# Patient Record
Sex: Female | Born: 1985 | Race: White | Hispanic: No | Marital: Married | Smoking: Current every day smoker
Health system: Southern US, Community
[De-identification: ages and names within clinical notes are randomized; demographics above are authoritative.]

## PROBLEM LIST (undated history)

## (undated) DIAGNOSIS — F1010 Alcohol abuse, uncomplicated: Secondary | ICD-10-CM

## (undated) DIAGNOSIS — Z3490 Encounter for supervision of normal pregnancy, unspecified, unspecified trimester: Principal | ICD-10-CM

## (undated) DIAGNOSIS — N390 Urinary tract infection, site not specified: Secondary | ICD-10-CM

## (undated) DIAGNOSIS — F329 Major depressive disorder, single episode, unspecified: Secondary | ICD-10-CM

## (undated) DIAGNOSIS — F17200 Nicotine dependence, unspecified, uncomplicated: Secondary | ICD-10-CM

## (undated) DIAGNOSIS — F32A Depression, unspecified: Secondary | ICD-10-CM

## (undated) DIAGNOSIS — F1110 Opioid abuse, uncomplicated: Secondary | ICD-10-CM

## (undated) DIAGNOSIS — R51 Headache: Secondary | ICD-10-CM

## (undated) DIAGNOSIS — F419 Anxiety disorder, unspecified: Secondary | ICD-10-CM

## (undated) DIAGNOSIS — F909 Attention-deficit hyperactivity disorder, unspecified type: Secondary | ICD-10-CM

## (undated) HISTORY — DX: Attention-deficit hyperactivity disorder, unspecified type: F90.9

## (undated) HISTORY — DX: Urinary tract infection, site not specified: N39.0

## (undated) HISTORY — DX: Nicotine dependence, unspecified, uncomplicated: F17.200

## (undated) HISTORY — PX: NO PAST SURGERIES: SHX2092

## (undated) HISTORY — DX: Opioid abuse, uncomplicated: F11.10

## (undated) HISTORY — DX: Encounter for supervision of normal pregnancy, unspecified, unspecified trimester: Z34.90

---
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
## 2007-12-23 ENCOUNTER — Emergency Department (HOSPITAL_COMMUNITY): Admission: EM | Admit: 2007-12-23 | Discharge: 2007-12-23 | Payer: Self-pay | Admitting: Emergency Medicine

## 2008-06-24 ENCOUNTER — Emergency Department (HOSPITAL_COMMUNITY): Admission: EM | Admit: 2008-06-24 | Discharge: 2008-06-24 | Payer: Self-pay | Admitting: Emergency Medicine

## 2008-08-27 ENCOUNTER — Emergency Department (HOSPITAL_COMMUNITY): Admission: EM | Admit: 2008-08-27 | Discharge: 2008-08-27 | Payer: Self-pay | Admitting: Emergency Medicine

## 2008-09-23 ENCOUNTER — Emergency Department (HOSPITAL_COMMUNITY): Admission: EM | Admit: 2008-09-23 | Discharge: 2008-09-23 | Payer: Self-pay | Admitting: Family Medicine

## 2008-12-04 ENCOUNTER — Emergency Department (HOSPITAL_COMMUNITY): Admission: EM | Admit: 2008-12-04 | Discharge: 2008-12-04 | Payer: Self-pay | Admitting: Emergency Medicine

## 2009-05-30 ENCOUNTER — Ambulatory Visit (HOSPITAL_COMMUNITY): Admission: RE | Admit: 2009-05-30 | Discharge: 2009-05-30 | Payer: Self-pay | Admitting: Family Medicine

## 2010-04-16 ENCOUNTER — Ambulatory Visit (HOSPITAL_COMMUNITY): Admission: RE | Admit: 2010-04-16 | Discharge: 2010-04-16 | Payer: Self-pay | Admitting: Internal Medicine

## 2011-06-10 ENCOUNTER — Other Ambulatory Visit (HOSPITAL_COMMUNITY)
Admission: RE | Admit: 2011-06-10 | Discharge: 2011-06-10 | Disposition: A | Discharge status: Home / Self Care | Payer: Medicaid Other | Source: Ambulatory Visit | Attending: Obstetrics and Gynecology | Admitting: Obstetrics and Gynecology

## 2011-06-10 DIAGNOSIS — Z01419 Encounter for gynecological examination (general) (routine) without abnormal findings: Secondary | ICD-10-CM | POA: Insufficient documentation

## 2011-06-10 DIAGNOSIS — Z113 Encounter for screening for infections with a predominantly sexual mode of transmission: Secondary | ICD-10-CM | POA: Insufficient documentation

## 2011-06-15 LAB — HIV ANTIBODY (ROUTINE TESTING W REFLEX): HIV: NONREACTIVE

## 2011-06-15 LAB — ABO/RH: RH Type: NEGATIVE

## 2011-06-15 LAB — HEPATITIS B SURFACE ANTIGEN: Hepatitis B Surface Ag: NEGATIVE

## 2011-07-06 LAB — DIFFERENTIAL
Basophils Absolute: 0
Eosinophils Relative: 2
Lymphs Abs: 2.4
Monocytes Absolute: 0.7
Neutrophils Relative %: 74

## 2011-07-06 LAB — COMPREHENSIVE METABOLIC PANEL
AST: 22
Albumin: 3.7
Alkaline Phosphatase: 70
Chloride: 108
GFR calc Af Amer: >60
Glucose, Bld: 101 — ABNORMAL HIGH
Potassium: 4.2
Sodium: 139

## 2011-07-06 LAB — CBC
MCHC: 34
RBC: 4.34
RDW: 12.8

## 2011-07-06 LAB — PREGNANCY, URINE: Preg Test, Ur: NEGATIVE

## 2011-10-06 HISTORY — PX: OVARIAN CYST DRAINAGE: SHX325

## 2011-10-10 ENCOUNTER — Emergency Department (HOSPITAL_COMMUNITY)
Admission: EM | Admit: 2011-10-10 | Discharge: 2011-10-10 | Disposition: A | Discharge status: Home / Self Care | Payer: Medicaid Other | Attending: Emergency Medicine | Admitting: Emergency Medicine

## 2011-10-10 DIAGNOSIS — J45909 Unspecified asthma, uncomplicated: Secondary | ICD-10-CM | POA: Insufficient documentation

## 2011-10-10 DIAGNOSIS — N390 Urinary tract infection, site not specified: Secondary | ICD-10-CM | POA: Insufficient documentation

## 2011-10-10 DIAGNOSIS — R109 Unspecified abdominal pain: Secondary | ICD-10-CM | POA: Insufficient documentation

## 2011-10-10 DIAGNOSIS — N23 Unspecified renal colic: Secondary | ICD-10-CM | POA: Insufficient documentation

## 2011-10-10 DIAGNOSIS — M549 Dorsalgia, unspecified: Secondary | ICD-10-CM | POA: Insufficient documentation

## 2011-10-10 DIAGNOSIS — Z331 Pregnant state, incidental: Secondary | ICD-10-CM | POA: Insufficient documentation

## 2011-10-10 DIAGNOSIS — F17200 Nicotine dependence, unspecified, uncomplicated: Secondary | ICD-10-CM | POA: Insufficient documentation

## 2011-10-10 LAB — BASIC METABOLIC PANEL
BUN: 16 mg/dL (ref 6–23)
CO2: 27 mEq/L (ref 19–32)
Creatinine, Ser: 0.69 mg/dL (ref 0.50–1.10)
GFR calc non Af Amer: >90 mL/min (ref >90)
Glucose, Bld: 87 mg/dL (ref 70–99)
Sodium: 137 mEq/L (ref 135–145)

## 2011-10-10 LAB — URINALYSIS, ROUTINE W REFLEX MICROSCOPIC
Bilirubin Urine: NEGATIVE
Glucose, UA: NEGATIVE mg/dL
Leukocytes, UA: NEGATIVE
Nitrite: NEGATIVE
pH: 6 (ref 5.0–8.0)

## 2011-10-10 LAB — CBC
Hemoglobin: 11.2 g/dL — ABNORMAL LOW (ref 12.0–15.0)
MCH: 30.9 pg (ref 26.0–34.0)
MCV: 90.1 fL (ref 78.0–100.0)
Platelets: 302 10*3/uL (ref 150–400)
RBC: 3.63 MIL/uL — ABNORMAL LOW (ref 3.87–5.11)
RDW: 12.4 % (ref 11.5–15.5)

## 2011-10-10 MED ORDER — CEFUROXIME AXETIL 250 MG PO TABS: 500.0000 mg | ORAL_TABLET | Freq: Two times a day (BID) | ORAL | Status: AC

## 2011-10-10 MED ORDER — MORPHINE SULFATE 4 MG/ML IJ SOLN
4.0000 mg | Freq: Once | INTRAMUSCULAR | Status: AC
  Administered 2011-10-10: 4 mg via INTRAVENOUS
  Filled 2011-10-10: qty 1

## 2011-10-10 MED ORDER — DEXTROSE 5 % IV SOLN
1.0000 g | INTRAVENOUS | Status: DC
  Administered 2011-10-10: 1 g via INTRAVENOUS
  Filled 2011-10-10: qty 10

## 2011-10-10 MED ORDER — HYDROCODONE-ACETAMINOPHEN 5-325 MG PO TABS: 1.0000 | ORAL_TABLET | Freq: Four times a day (QID) | ORAL | Status: AC | PRN

## 2011-10-10 MED ORDER — ACETAMINOPHEN 325 MG PO TABS
650.0000 mg | ORAL_TABLET | Freq: Once | ORAL | Status: DC
  Filled 2011-10-10: qty 2

## 2011-10-10 MED ORDER — SODIUM CHLORIDE 0.9 % IV SOLN
Freq: Once | INTRAVENOUS | Status: AC
  Administered 2011-10-10: 21:00:00 via INTRAVENOUS

## 2011-10-10 NOTE — Progress Notes (Signed)
Pt in to [facility] c/o low back pain.  Pt reports no leaking of fluid or bleeding.  Positive fetal movement.  Pt receives care at [facility]

## 2011-10-10 NOTE — ED Notes (Signed)
Spoke with Morrie Sheldon, RN at [facility], she reports the baby looks fine and advises pt can come off monitor if ok with EDP

## 2011-10-10 NOTE — ED Notes (Signed)
Pt reports that she just took 2 extra strength Tylenol prior to arrival.  Dr Lynelle Doctor notified.  Medication held.

## 2011-10-10 NOTE — ED Provider Notes (Signed)
History     CSN: [id number]  Arrival date & time 10/10/11  1912   First MD Initiated Contact with Patient 10/10/11 1935      Chief Complaint  Patient presents with  . Back Pain    (Consider location/radiation/quality/duration/timing/severity/associated sxs/prior treatment) HPI Patient presents emergency room with complaints of left-sided back pain. Patient is currently [redacted] weeks pregnant. She has not had any complications with the pregnancy. She did have an episode of flank pain a few weeks ago had a urinalysis that was normal the symptoms subsequently resolved. Patient does not recall any recent injuries. She has not had any abdominal pain or noticed any contractions. The pain in her back does seem to come and go. She cannot recall anything specifically makes it worse or better. She has not noticed any vaginal bleeding or any leakage of fluid. Past Medical History  Diagnosis Date  . Asthma     History reviewed. No pertinent past surgical history.  No family history on file.  History  Substance Use Topics  . Smoking status: Current Everyday Smoker -- 0.5 packs/day  . Smokeless tobacco: Not on file  . Alcohol Use: No    OB History    Grav Para Term Preterm Abortions TAB SAB Ect Mult Living   1               Review of Systems  Constitutional: Negative for fever.  Respiratory: Negative for cough.   All other systems reviewed and are negative.    Allergies  Darvocet  Home Medications  No current outpatient prescriptions on file.  BP 115/71  Pulse 90  Temp(Src) 98.2 F (36.8 C) (Oral)  Resp 20  Ht 5\' 6"  (1.676 m)  Wt 170 lb (77.111 kg)  BMI 27.44 kg/m2  SpO2 100%  Physical Exam  Nursing note and vitals reviewed. Constitutional: She appears well-developed and well-nourished. No distress.  HENT:  Head: Normocephalic and atraumatic.  Right Ear: External ear normal.  Left Ear: External ear normal.  Eyes: Conjunctivae are normal. Right eye exhibits no  discharge. Left eye exhibits no discharge. No scleral icterus.  Neck: Neck supple. No tracheal deviation present.  Cardiovascular: Normal rate, regular rhythm and intact distal pulses.   Pulmonary/Chest: Effort normal and breath sounds normal. No stridor. No respiratory distress. She has no wheezes. She has no rales.  Abdominal: Soft. Bowel sounds are normal. She exhibits no distension. Mass: gravid abdomen. There is no tenderness. There is no rebound and no guarding.  Musculoskeletal: She exhibits no edema and no tenderness.       Mild left CVA tenderness  Neurological: She is alert. She has normal strength. No sensory deficit. Cranial nerve deficit:  no gross defecits noted. She exhibits normal muscle tone. She displays no seizure activity. Coordination normal.  Skin: Skin is warm and dry. No rash noted.  Psychiatric: She has a normal mood and affect.    ED Course  Procedures (including critical care time)  Medications  acetaminophen (TYLENOL) tablet 650 mg (650 mg Oral Not Given 10/10/11 2021)  cefTRIAXone (ROCEPHIN) 1 g in dextrose 5 % 50 mL IVPB (1 g Intravenous Given 10/10/11 2057)  Fluticasone-Salmeterol (ADVAIR) 100-50 MCG/DOSE AEPB (not administered)  prenatal vitamin w/FE, FA (PRENATAL 1 + 1) 27-1 MG TABS (not administered)  morphine 4 MG/ML injection 4 mg (4 mg Intravenous Given 10/10/11 2042)  0.9 %  sodium chloride infusion (  Intravenous New Bag/Given 10/10/11 2057)    Labs Reviewed  URINALYSIS, ROUTINE W  REFLEX MICROSCOPIC - Abnormal; Notable for the following:    APPearance CLOUDY (*)    Specific Gravity, Urine >1.030 (*)    Hgb urine dipstick LARGE (*)    Ketones, ur TRACE (*)    All other components within normal limits  URINE MICROSCOPIC-ADD ON - Abnormal; Notable for the following:    Squamous Epithelial / LPF MANY (*)    Bacteria, UA MANY (*)    All other components within normal limits  CBC - Abnormal; Notable for the following:    WBC 12.7 (*)    RBC 3.63 (*)     Hemoglobin 11.2 (*)    HCT 32.7 (*)    All other components within normal limits  BASIC METABOLIC PANEL   No results found.   1. Renal colic   2. Pregnancy as incidental finding       MDM  The patient was placed on fetal monitoring. She does not have any evidence of contractions and there is was a reassuring strip according to the nurse at [facility] hospital..  patient does have flank pain in his to numerous to count red blood cells in her urine. She does have bacteria and 11-20 white blood cells but her symptoms are more suspicious for possible renal colic. Patient has never had kidney stones before but her sister has had them and had a similar presentation. With her pregnancy, a CT scan for confirmation is not indicated. Renal ultrasound would be another option to check for hydronephrosis her vertigo is not available this evening. Patient does not appear to be in any distress. Do feel it is reasonable to treat her with oral pain medications I will empirically start her on antibiotics to cover for urinary tract infection. Patient should followup with her OB doctor on Monday to be rechecked. If She is having worsening symptoms she is to return to the emergent or go to [facility] hospital for further evaluation        Celene Kras, MD 10/10/11 2147

## 2011-10-10 NOTE — ED Notes (Signed)
Pt presents with left sided back pain along border of ribs. Pt denies urinary symptoms but states she has had UTI while pregnant. Pt is 29.[redacted] weeks pregnant. OBGYN Dr Emelda Fear.

## 2011-10-10 NOTE — Progress Notes (Signed)
APRN states that pt to be d/c home to follow up w/ Dr. Emelda Fear.  Pt given abx for UTI and possible kidney stone

## 2011-12-08 ENCOUNTER — Other Ambulatory Visit: Payer: Self-pay | Admitting: Obstetrics & Gynecology

## 2011-12-09 ENCOUNTER — Encounter (HOSPITAL_COMMUNITY): Payer: Self-pay | Admitting: Pharmacist

## 2011-12-09 ENCOUNTER — Encounter (HOSPITAL_COMMUNITY): Payer: Self-pay

## 2011-12-10 ENCOUNTER — Other Ambulatory Visit (HOSPITAL_COMMUNITY): Payer: Medicaid Other

## 2011-12-10 ENCOUNTER — Encounter (HOSPITAL_COMMUNITY)
Admission: RE | Admit: 2011-12-10 | Discharge: 2011-12-10 | Disposition: A | Discharge status: Home / Self Care | Payer: Medicaid Other | Source: Ambulatory Visit | Attending: Obstetrics & Gynecology | Admitting: Obstetrics & Gynecology

## 2011-12-10 ENCOUNTER — Encounter (HOSPITAL_COMMUNITY): Payer: Self-pay

## 2011-12-10 HISTORY — DX: Major depressive disorder, single episode, unspecified: F32.9

## 2011-12-10 HISTORY — DX: Headache: R51

## 2011-12-10 HISTORY — DX: Anxiety disorder, unspecified: F41.9

## 2011-12-10 HISTORY — DX: Depression, unspecified: F32.A

## 2011-12-10 LAB — CBC
HCT: 37 % (ref 36.0–46.0)
Hemoglobin: 12.1 g/dL (ref 12.0–15.0)
MCH: 29.6 pg (ref 26.0–34.0)
MCHC: 32.7 g/dL (ref 30.0–36.0)
MCV: 90.5 fL (ref 78.0–100.0)
Platelets: 315 10*3/uL (ref 150–400)
RBC: 4.09 MIL/uL (ref 3.87–5.11)
RDW: 13.2 % (ref 11.5–15.5)
WBC: 10.9 10*3/uL — ABNORMAL HIGH (ref 4.0–10.5)

## 2011-12-10 LAB — URINALYSIS, ROUTINE W REFLEX MICROSCOPIC
Bilirubin Urine: NEGATIVE
Glucose, UA: NEGATIVE mg/dL
Ketones, ur: NEGATIVE mg/dL
Nitrite: NEGATIVE
Protein, ur: NEGATIVE mg/dL
Specific Gravity, Urine: 1.025 (ref 1.005–1.030)
Urobilinogen, UA: 0.2 mg/dL (ref 0.0–1.0)
pH: 6 (ref 5.0–8.0)

## 2011-12-10 LAB — COMPREHENSIVE METABOLIC PANEL
ALT: 11 U/L (ref 0–35)
AST: 12 U/L (ref 0–37)
Albumin: 2.4 g/dL — ABNORMAL LOW (ref 3.5–5.2)
Alkaline Phosphatase: 140 U/L — ABNORMAL HIGH (ref 39–117)
BUN: 12 mg/dL (ref 6–23)
CO2: 25 mEq/L (ref 19–32)
Calcium: 9 mg/dL (ref 8.4–10.5)
Chloride: 103 mEq/L (ref 96–112)
Creatinine, Ser: 0.66 mg/dL (ref 0.50–1.10)
GFR calc Af Amer: >90 mL/min (ref >90)
GFR calc non Af Amer: >90 mL/min (ref >90)
Glucose, Bld: 83 mg/dL (ref 70–99)
Potassium: 3.8 mEq/L (ref 3.5–5.1)
Sodium: 135 mEq/L (ref 135–145)
Total Bilirubin: 0.1 mg/dL — ABNORMAL LOW (ref 0.3–1.2)
Total Protein: 5.9 g/dL — ABNORMAL LOW (ref 6.0–8.3)

## 2011-12-10 LAB — URINE MICROSCOPIC-ADD ON

## 2011-12-10 NOTE — Patient Instructions (Addendum)
20 Brittany Cortez  12/10/2011   Your procedure is scheduled on:  12/16/11  Enter through the Main Entrance of [facility] at 3PM   Pick up the phone at the desk and dial [phone].   Call this number if you have problems the morning of surgery: [phone]   Remember:   Do not eat food: after 10AM per Dr. Rodman Pickle, may have cereal, toast, no fat, no protein the day of surgery.  Do not drink clear liquids: after 2PM.  Take these medicines the morning of surgery with A SIP OF WATER: Pain medication if needed   Do not wear jewelry, make-up or nail polish.  Do not wear lotions, powders, or perfumes. You may wear deodorant.  Do not shave 48 hours prior to surgery.  Do not bring valuables to the hospital.  Contacts, dentures or bridgework may not be worn into surgery.  Leave suitcase in the car. After surgery it may be brought to your room.  For patients admitted to the hospital, checkout time is 11:00 AM the day of discharge.   Patients discharged the day of surgery will not be allowed to drive home.  Name and phone number of your driver: NA  Special Instructions: CHG Shower Use Special Wash: 1/2 bottle night before surgery and 1/2 bottle morning of surgery.   Please read over the following fact sheets that you were given: MRSA Information

## 2011-12-15 MED ORDER — CEFAZOLIN SODIUM-DEXTROSE 2-3 GM-% IV SOLR
2.0000 g | INTRAVENOUS | Status: AC
  Administered 2011-12-16: 2 g via INTRAVENOUS
  Filled 2011-12-15: qty 50

## 2011-12-16 ENCOUNTER — Encounter (HOSPITAL_COMMUNITY): Payer: Self-pay | Admitting: *Deleted

## 2011-12-16 ENCOUNTER — Inpatient Hospital Stay (HOSPITAL_COMMUNITY): Payer: Medicaid Other

## 2011-12-16 ENCOUNTER — Inpatient Hospital Stay (HOSPITAL_COMMUNITY)
Admission: RE | Admit: 2011-12-16 | Discharge: 2011-12-18 | DRG: 766 | Disposition: A | Discharge status: Home / Self Care | Payer: Medicaid Other | Source: Ambulatory Visit | Attending: Obstetrics & Gynecology | Admitting: Obstetrics & Gynecology

## 2011-12-16 ENCOUNTER — Encounter (HOSPITAL_COMMUNITY)
Admission: RE | Disposition: A | Discharge status: Home / Self Care | Payer: Self-pay | Source: Ambulatory Visit | Attending: Obstetrics & Gynecology

## 2011-12-16 ENCOUNTER — Encounter (HOSPITAL_COMMUNITY): Payer: Self-pay

## 2011-12-16 ENCOUNTER — Encounter (HOSPITAL_COMMUNITY): Payer: Self-pay | Admitting: Anesthesiology

## 2011-12-16 DIAGNOSIS — O321XX0 Maternal care for breech presentation, not applicable or unspecified: Principal | ICD-10-CM | POA: Diagnosis present

## 2011-12-16 LAB — SURGICAL PCR SCREEN
MRSA, PCR: NEGATIVE
Staphylococcus aureus: NEGATIVE

## 2011-12-16 SURGERY — Surgical Case
Anesthesia: Spinal

## 2011-12-16 MED ORDER — EPHEDRINE 5 MG/ML INJ
INTRAVENOUS | Status: AC
  Filled 2011-12-16: qty 10

## 2011-12-16 MED ORDER — OXYTOCIN 10 UNIT/ML IJ SOLN
INTRAMUSCULAR | Status: AC
  Filled 2011-12-16: qty 4

## 2011-12-16 MED ORDER — ONDANSETRON HCL 4 MG/2ML IJ SOLN: 4.0000 mg | Freq: Three times a day (TID) | INTRAMUSCULAR | Status: DC | PRN

## 2011-12-16 MED ORDER — DIPHENHYDRAMINE HCL 50 MG/ML IJ SOLN: 12.5000 mg | Freq: Four times a day (QID) | INTRAMUSCULAR | Status: DC | PRN

## 2011-12-16 MED ORDER — NALOXONE HCL 0.4 MG/ML IJ SOLN: 0.4000 mg | INTRAMUSCULAR | Status: DC | PRN

## 2011-12-16 MED ORDER — MEPERIDINE HCL 25 MG/ML IJ SOLN: 6.2500 mg | INTRAMUSCULAR | Status: DC | PRN

## 2011-12-16 MED ORDER — OXYTOCIN 20 UNITS IN LACTATED RINGERS INFUSION - SIMPLE
INTRAVENOUS | Status: DC | PRN
  Administered 2011-12-16: 20 [IU] via INTRAVENOUS

## 2011-12-16 MED ORDER — ACETAMINOPHEN 10 MG/ML IV SOLN: 1000.0000 mg | Freq: Four times a day (QID) | INTRAVENOUS | Status: AC | PRN

## 2011-12-16 MED ORDER — LANOLIN HYDROUS EX OINT: 1.0000 "application " | TOPICAL_OINTMENT | CUTANEOUS | Status: DC | PRN

## 2011-12-16 MED ORDER — NICOTINE 7 MG/24HR TD PT24
7.0000 mg | MEDICATED_PATCH | Freq: Every day | TRANSDERMAL | Status: DC
  Administered 2011-12-17 (×2): 7 mg via TRANSDERMAL
  Filled 2011-12-16 (×5): qty 1

## 2011-12-16 MED ORDER — FENTANYL CITRATE 0.05 MG/ML IJ SOLN
INTRAMUSCULAR | Status: AC
  Filled 2011-12-16: qty 2

## 2011-12-16 MED ORDER — LACTATED RINGERS IV SOLN
INTRAVENOUS | Status: DC
  Administered 2011-12-16: 16:00:00 via INTRAVENOUS

## 2011-12-16 MED ORDER — OXYTOCIN 20 UNITS IN LACTATED RINGERS INFUSION - SIMPLE
125.0000 mL/h | INTRAVENOUS | Status: AC
  Administered 2011-12-16: 125 mL/h via INTRAVENOUS

## 2011-12-16 MED ORDER — OXYCODONE-ACETAMINOPHEN 5-325 MG PO TABS
1.0000 | ORAL_TABLET | ORAL | Status: DC | PRN
  Administered 2011-12-17 – 2011-12-18 (×5): 2 via ORAL
  Filled 2011-12-16 (×5): qty 2

## 2011-12-16 MED ORDER — MUPIROCIN 2 % EX OINT
TOPICAL_OINTMENT | CUTANEOUS | Status: AC
  Filled 2011-12-16: qty 22

## 2011-12-16 MED ORDER — OXYTOCIN 20 UNITS IN LACTATED RINGERS INFUSION - SIMPLE
INTRAVENOUS | Status: AC
  Filled 2011-12-16: qty 1000

## 2011-12-16 MED ORDER — ONDANSETRON HCL 4 MG/2ML IJ SOLN
INTRAMUSCULAR | Status: DC | PRN
  Administered 2011-12-16: 4 mg via INTRAVENOUS

## 2011-12-16 MED ORDER — METOCLOPRAMIDE HCL 5 MG/ML IJ SOLN: 10.0000 mg | Freq: Three times a day (TID) | INTRAMUSCULAR | Status: DC | PRN

## 2011-12-16 MED ORDER — SODIUM CHLORIDE 0.9 % IJ SOLN: 9.0000 mL | INTRAMUSCULAR | Status: DC | PRN

## 2011-12-16 MED ORDER — KETOROLAC TROMETHAMINE 30 MG/ML IJ SOLN
30.0000 mg | Freq: Four times a day (QID) | INTRAMUSCULAR | Status: AC | PRN
  Administered 2011-12-16 (×2): 30 mg via INTRAVENOUS
  Filled 2011-12-16: qty 1

## 2011-12-16 MED ORDER — EPHEDRINE SULFATE 50 MG/ML IJ SOLN
INTRAMUSCULAR | Status: DC | PRN
  Administered 2011-12-16: 10 mg via INTRAVENOUS
  Administered 2011-12-16: 15 mg via INTRAVENOUS
  Administered 2011-12-16: 10 mg via INTRAVENOUS
  Administered 2011-12-16: 5 mg via INTRAVENOUS

## 2011-12-16 MED ORDER — DIPHENHYDRAMINE HCL 50 MG/ML IJ SOLN: 25.0000 mg | INTRAMUSCULAR | Status: DC | PRN

## 2011-12-16 MED ORDER — SCOPOLAMINE 1 MG/3DAYS TD PT72: 1.0000 | MEDICATED_PATCH | Freq: Once | TRANSDERMAL | Status: DC

## 2011-12-16 MED ORDER — FLUTICASONE-SALMETEROL 100-50 MCG/DOSE IN AEPB
1.0000 | INHALATION_SPRAY | Freq: Two times a day (BID) | RESPIRATORY_TRACT | Status: DC
  Administered 2011-12-17 – 2011-12-18 (×2): 1 via RESPIRATORY_TRACT
  Filled 2011-12-16: qty 14

## 2011-12-16 MED ORDER — DIPHENHYDRAMINE HCL 25 MG PO CAPS: 25.0000 mg | ORAL_CAPSULE | ORAL | Status: DC | PRN

## 2011-12-16 MED ORDER — IBUPROFEN 600 MG PO TABS
600.0000 mg | ORAL_TABLET | Freq: Four times a day (QID) | ORAL | Status: DC
  Administered 2011-12-17 – 2011-12-18 (×6): 600 mg via ORAL
  Filled 2011-12-16 (×6): qty 1

## 2011-12-16 MED ORDER — PRENATAL MULTIVITAMIN CH
1.0000 | ORAL_TABLET | Freq: Every day | ORAL | Status: DC
  Administered 2011-12-17 – 2011-12-18 (×2): 1 via ORAL
  Filled 2011-12-16 (×2): qty 1

## 2011-12-16 MED ORDER — MORPHINE SULFATE (PF) 0.5 MG/ML IJ SOLN
INTRAMUSCULAR | Status: DC | PRN
  Administered 2011-12-16: .1 mg via INTRATHECAL

## 2011-12-16 MED ORDER — DIBUCAINE 1 % RE OINT: 1.0000 "application " | TOPICAL_OINTMENT | RECTAL | Status: DC | PRN

## 2011-12-16 MED ORDER — PHENYLEPHRINE 40 MCG/ML (10ML) SYRINGE FOR IV PUSH (FOR BLOOD PRESSURE SUPPORT)
PREFILLED_SYRINGE | INTRAVENOUS | Status: AC
  Filled 2011-12-16: qty 5

## 2011-12-16 MED ORDER — NALBUPHINE HCL 10 MG/ML IJ SOLN
5.0000 mg | INTRAMUSCULAR | Status: DC | PRN
  Filled 2011-12-16: qty 1

## 2011-12-16 MED ORDER — SIMETHICONE 80 MG PO CHEW
80.0000 mg | CHEWABLE_TABLET | Freq: Three times a day (TID) | ORAL | Status: DC
  Administered 2011-12-17 – 2011-12-18 (×5): 80 mg via ORAL

## 2011-12-16 MED ORDER — MIDAZOLAM HCL 2 MG/2ML IJ SOLN: 0.5000 mg | Freq: Once | INTRAMUSCULAR | Status: DC | PRN

## 2011-12-16 MED ORDER — ONDANSETRON HCL 4 MG/2ML IJ SOLN: 4.0000 mg | INTRAMUSCULAR | Status: DC | PRN

## 2011-12-16 MED ORDER — SCOPOLAMINE 1 MG/3DAYS TD PT72
MEDICATED_PATCH | TRANSDERMAL | Status: AC
  Administered 2011-12-16: 1.5 mg via TRANSDERMAL
  Filled 2011-12-16: qty 1

## 2011-12-16 MED ORDER — SCOPOLAMINE 1 MG/3DAYS TD PT72
1.0000 | MEDICATED_PATCH | Freq: Once | TRANSDERMAL | Status: DC
  Administered 2011-12-16: 1.5 mg via TRANSDERMAL

## 2011-12-16 MED ORDER — ONDANSETRON HCL 4 MG/2ML IJ SOLN: 4.0000 mg | Freq: Four times a day (QID) | INTRAMUSCULAR | Status: DC | PRN

## 2011-12-16 MED ORDER — SCOPOLAMINE 1 MG/3DAYS TD PT72
MEDICATED_PATCH | TRANSDERMAL | Status: AC
  Filled 2011-12-16: qty 1

## 2011-12-16 MED ORDER — DIPHENHYDRAMINE HCL 25 MG PO CAPS: 25.0000 mg | ORAL_CAPSULE | Freq: Four times a day (QID) | ORAL | Status: DC | PRN

## 2011-12-16 MED ORDER — ACETAMINOPHEN 325 MG PO TABS: 325.0000 mg | ORAL_TABLET | ORAL | Status: DC | PRN

## 2011-12-16 MED ORDER — TETANUS-DIPHTH-ACELL PERTUSSIS 5-2.5-18.5 LF-MCG/0.5 IM SUSP: 0.5000 mL | Freq: Once | INTRAMUSCULAR | Status: DC

## 2011-12-16 MED ORDER — MENTHOL 3 MG MT LOZG: 1.0000 | LOZENGE | OROMUCOSAL | Status: DC | PRN

## 2011-12-16 MED ORDER — SENNOSIDES-DOCUSATE SODIUM 8.6-50 MG PO TABS
2.0000 | ORAL_TABLET | Freq: Every day | ORAL | Status: DC
  Administered 2011-12-17: 2 via ORAL

## 2011-12-16 MED ORDER — ONDANSETRON HCL 4 MG/2ML IJ SOLN
INTRAMUSCULAR | Status: AC
  Filled 2011-12-16: qty 2

## 2011-12-16 MED ORDER — SODIUM CHLORIDE 0.9 % IJ SOLN: 3.0000 mL | INTRAMUSCULAR | Status: DC | PRN

## 2011-12-16 MED ORDER — ESCITALOPRAM OXALATE 10 MG PO TABS
10.0000 mg | ORAL_TABLET | Freq: Every day | ORAL | Status: DC
  Administered 2011-12-17 – 2011-12-18 (×2): 10 mg via ORAL
  Filled 2011-12-16 (×3): qty 1

## 2011-12-16 MED ORDER — LACTATED RINGERS IV SOLN
INTRAVENOUS | Status: DC
  Administered 2011-12-17: 03:00:00 via INTRAVENOUS

## 2011-12-16 MED ORDER — DIPHENHYDRAMINE HCL 12.5 MG/5ML PO ELIX
12.5000 mg | ORAL_SOLUTION | Freq: Four times a day (QID) | ORAL | Status: DC | PRN
  Filled 2011-12-16: qty 5

## 2011-12-16 MED ORDER — IBUPROFEN 600 MG PO TABS: 600.0000 mg | ORAL_TABLET | Freq: Four times a day (QID) | ORAL | Status: DC | PRN

## 2011-12-16 MED ORDER — SODIUM CHLORIDE 0.9 % IV SOLN: 1.0000 ug/kg/h | INTRAVENOUS | Status: DC | PRN

## 2011-12-16 MED ORDER — KETOROLAC TROMETHAMINE 30 MG/ML IJ SOLN: 30.0000 mg | Freq: Four times a day (QID) | INTRAMUSCULAR | Status: AC | PRN

## 2011-12-16 MED ORDER — BUPIVACAINE IN DEXTROSE 0.75-8.25 % IT SOLN
INTRATHECAL | Status: DC | PRN
  Administered 2011-12-16: 12 mg via INTRATHECAL

## 2011-12-16 MED ORDER — ZOLPIDEM TARTRATE 5 MG PO TABS
5.0000 mg | ORAL_TABLET | Freq: Every evening | ORAL | Status: DC | PRN
Start: 2011-12-16 — End: 2011-12-18

## 2011-12-16 MED ORDER — SIMETHICONE 80 MG PO CHEW: 80.0000 mg | CHEWABLE_TABLET | ORAL | Status: DC | PRN

## 2011-12-16 MED ORDER — WITCH HAZEL-GLYCERIN EX PADS: 1.0000 "application " | MEDICATED_PAD | CUTANEOUS | Status: DC | PRN

## 2011-12-16 MED ORDER — ONDANSETRON HCL 4 MG PO TABS
4.0000 mg | ORAL_TABLET | ORAL | Status: DC | PRN
Start: 2011-12-16 — End: 2011-12-18

## 2011-12-16 MED ORDER — LACTATED RINGERS IV SOLN
INTRAVENOUS | Status: DC | PRN
  Administered 2011-12-16 (×4): via INTRAVENOUS

## 2011-12-16 MED ORDER — FENTANYL CITRATE 0.05 MG/ML IJ SOLN
25.0000 ug | INTRAMUSCULAR | Status: DC | PRN
  Administered 2011-12-16 (×4): 50 ug via INTRAVENOUS

## 2011-12-16 MED ORDER — CYCLOBENZAPRINE HCL 10 MG PO TABS
10.0000 mg | ORAL_TABLET | Freq: Two times a day (BID) | ORAL | Status: DC | PRN
  Filled 2011-12-16: qty 1

## 2011-12-16 MED ORDER — PROMETHAZINE HCL 25 MG/ML IJ SOLN: 6.2500 mg | INTRAMUSCULAR | Status: DC | PRN

## 2011-12-16 MED ORDER — HYDROMORPHONE 0.3 MG/ML IV SOLN
INTRAVENOUS | Status: DC
  Administered 2011-12-17: 0.999 mg via INTRAVENOUS
  Administered 2011-12-17: 2.79 mg via INTRAVENOUS
  Administered 2011-12-17: 5.33 mL via INTRAVENOUS
  Administered 2011-12-17 (×2): via INTRAVENOUS
  Administered 2011-12-17: 2.7 mg via INTRAVENOUS

## 2011-12-16 MED ORDER — FENTANYL CITRATE 0.05 MG/ML IJ SOLN
INTRAMUSCULAR | Status: DC | PRN
  Administered 2011-12-16: 25 ug via INTRATHECAL

## 2011-12-16 MED ORDER — MORPHINE SULFATE 0.5 MG/ML IJ SOLN
INTRAMUSCULAR | Status: AC
  Filled 2011-12-16: qty 10

## 2011-12-16 MED ORDER — DIPHENHYDRAMINE HCL 50 MG/ML IJ SOLN: 12.5000 mg | INTRAMUSCULAR | Status: DC | PRN

## 2011-12-16 MED ORDER — KETOROLAC TROMETHAMINE 30 MG/ML IJ SOLN
INTRAMUSCULAR | Status: AC
  Administered 2011-12-16: 30 mg via INTRAVENOUS
  Filled 2011-12-16: qty 1

## 2011-12-16 SURGICAL SUPPLY — 29 items
CLOTH BEACON ORANGE TIMEOUT ST (SAFETY) ×2 IMPLANT
DERMABOND ADVANCED (GAUZE/BANDAGES/DRESSINGS) ×2
DERMABOND ADVANCED .7 DNX12 (GAUZE/BANDAGES/DRESSINGS) ×2 IMPLANT
DURAPREP 26ML APPLICATOR (WOUND CARE) ×4 IMPLANT
ELECT REM PT RETURN 9FT ADLT (ELECTROSURGICAL) ×2
ELECTRODE REM PT RTRN 9FT ADLT (ELECTROSURGICAL) ×1 IMPLANT
EXTRACTOR VACUUM BELL STYLE (SUCTIONS) IMPLANT
GLOVE BIOGEL PI IND STRL 8 (GLOVE) ×2 IMPLANT
GLOVE BIOGEL PI INDICATOR 8 (GLOVE) ×2
GLOVE ECLIPSE 8.0 STRL XLNG CF (GLOVE) ×2 IMPLANT
KIT ABG SYR 3ML LUER SLIP (SYRINGE) ×2 IMPLANT
NEEDLE HYPO 25X5/8 SAFETYGLIDE (NEEDLE) ×2 IMPLANT
NS IRRIG 1000ML POUR BTL (IV SOLUTION) ×2 IMPLANT
PACK C SECTION WH (CUSTOM PROCEDURE TRAY) ×2 IMPLANT
RTRCTR C-SECT PINK 25CM LRG (MISCELLANEOUS) IMPLANT
SLEEVE SCD COMPRESS KNEE MED (MISCELLANEOUS) IMPLANT
STAPLER VISISTAT 35W (STAPLE) IMPLANT
SUT CHROMIC 0 CT 1 (SUTURE) ×2 IMPLANT
SUT MNCRL 0 VIOLET CTX 36 (SUTURE) ×2 IMPLANT
SUT MONOCRYL 0 CTX 36 (SUTURE) ×2
SUT PLAIN 2 0 (SUTURE)
SUT PLAIN 2 0 XLH (SUTURE) IMPLANT
SUT PLAIN ABS 2-0 CT1 27XMFL (SUTURE) IMPLANT
SUT VIC AB 0 CTX 36 (SUTURE) ×1
SUT VIC AB 0 CTX36XBRD ANBCTRL (SUTURE) ×1 IMPLANT
SUT VIC AB 4-0 KS 27 (SUTURE) IMPLANT
TOWEL OR 17X24 6PK STRL BLUE (TOWEL DISPOSABLE) ×4 IMPLANT
TRAY FOLEY CATH 14FR (SET/KITS/TRAYS/PACK) IMPLANT
WATER STERILE IRR 1000ML POUR (IV SOLUTION) ×2 IMPLANT

## 2011-12-16 NOTE — Progress Notes (Signed)
Spoke to Dr. Jean Rosenthal with anesthesia regarding patient's pain level of 7/10. He states to give her another 30 mg dose of IV toradol. Will medicate and continue to monitor patient.

## 2011-12-16 NOTE — H&P (Signed)
Brittany Cortez is a 26 y.o. female G1P0 with IUP at [redacted]w[redacted]d presenting for primary cesarean section for breech presentation. Pt states she has been having no contractions, no vaginal bleeding, no leaking fluid.  She reports good fetal activity.  She desires to have an IUD for contraception.  [facility]are at [facility].   Prenatal History/Complications:  Past Medical History: Past Medical History  Diagnosis Date  . Asthma   . Headache   . Depression   . Anxiety     Past Surgical History: Past Surgical History  Procedure Date  . No past surgeries     Obstetrical History: OB History    Grav Para Term Preterm Abortions TAB SAB Ect Mult Living   1         0      Gynecological History: OB History    Grav Para Term Preterm Abortions TAB SAB Ect Mult Living   1         0      Social History: History   Social History  . Marital Status: Single    Spouse Name: N/A    Number of Children: N/A  . Years of Education: N/A   Social History Main Topics  . Smoking status: Current Everyday Smoker -- 0.5 packs/day  . Smokeless tobacco: None  . Alcohol Use: No  . Drug Use: No  . Sexually Active:    Other Topics Concern  . None   Social History Narrative  . None    Family History: History reviewed. No pertinent family history.  Allergies: Allergies  Allergen Reactions  . Darvocet (Propoxyphene N-Acetaminophen) Nausea And Vomiting    Prescriptions prior to admission  Medication Sig Dispense Refill  . cyclobenzaprine (FLEXERIL) 10 MG tablet Take 10 mg by mouth 2 (two) times daily as needed. For spasms      . diphenhydrAMINE (BENADRYL) 25 mg capsule Take 25 mg by mouth every other day. Alternates with Ambien every other night for sleep.      Marland Kitchen escitalopram (LEXAPRO) 10 MG tablet Take 10 mg by mouth daily.      . Fluticasone-Salmeterol (ADVAIR) 100-50 MCG/DOSE AEPB Inhale 1 puff into the lungs every 12 (twelve) hours.        Marland Kitchen HYDROcodone-acetaminophen (NORCO) 5-325 MG per  tablet Take 1 tablet by mouth every 6 (six) hours as needed. For back pain      . prenatal vitamin w/FE, FA (PRENATAL 1 + 1) 27-1 MG TABS Take 1 tablet by mouth daily.        Marland Kitchen zolpidem (AMBIEN) 5 MG tablet Take 5 mg by mouth every other day. Alternates with benadryl every other night for sleep        Review of Systems - Negative   Pulse 75, temperature 98.4 F (36.9 C), temperature source Oral, resp. rate 18, SpO2 99.00%. General appearance: alert, cooperative and no distress Head: Normocephalic, without obvious abnormality, atraumatic Eyes: conjunctivae/corneas clear. PERRL, EOM's intact. Fundi benign. Lungs: clear to auscultation bilaterally Heart: regular rate and rhythm, S1, S2 normal, no murmur, click, rub or gallop Abdomen: soft, non-tender; bowel sounds normal; no masses,  no organomegaly Extremities: extremities normal, atraumatic, no cyanosis or edema Pulses: 2+ and symmetric Skin: Skin color, texture, turgor normal. No rashes or lesions Neurologic: Alert and oriented X 3, normal strength and tone. Normal symmetric reflexes. Normal coordination and gait breech    Prenatal labs: ABO, Rh: O/Negative/-- (09/10 1424) Antibody: Negative (09/10 1424) Rubella:  immune RPR: NON  REACTIVE (03/07 1005)  HBsAg: Negative (09/10 1424)  HIV: Non-reactive (09/10 1424)  GBS:   negative 2 hr Glucola negative Genetic Testing: MSAFP normal Anatomy US - normal   Assessment: Brittany Cortez is a 26 y.o. G1P0 with an IUP at [redacted]w[redacted]d presenting for primary low transverse cesarean section for breech.  Plan: The risks of cesarean section discussed with the patient included but were not limited to: bleeding which may require transfusion or reoperation; infection which may require antibiotics; injury to bowel, bladder, ureters or other surrounding organs; injury to the fetus; need for additional procedures including hysterectomy in the event of a life-threatening hemorrhage; placental abnormalities  wth subsequent pregnancies, incisional problems, thromboembolic phenomenon and other postoperative/anesthesia complications. The patient concurred with the proposed plan, giving informed written consent for the procedure.    Patient wishes to breast feed.   Brittany Cortez 12/16/2011, 5:36 PM

## 2011-12-16 NOTE — Transfer of Care (Signed)
Immediate Anesthesia Transfer of Care Note  Patient: Brittany Cortez  Procedure(s) Performed: Procedure(s) (LRB): CESAREAN SECTION (N/A)  Patient Location: PACU  Anesthesia Type: Spinal  Level of Consciousness: awake, alert  and oriented  Airway & Oxygen Therapy: Patient Spontanous Breathing  Post-op Assessment: Report given to PACU RN and Post -op Vital signs reviewed and stable  Post vital signs: Reviewed and stable  Complications: No apparent anesthesia complications

## 2011-12-16 NOTE — Anesthesia Procedure Notes (Signed)
Spinal  Patient location during procedure: OR Start time: 12/16/2011 6:20 PM Staffing Anesthesiologist: Brayton Caves R Performed by: anesthesiologist  Preanesthetic Checklist Completed: patient identified, site marked, surgical consent, pre-op evaluation, timeout performed, IV checked, risks and benefits discussed and monitors and equipment checked Spinal Block Patient position: sitting Prep: DuraPrep Patient monitoring: heart rate, cardiac monitor, continuous pulse ox and blood pressure Approach: midline Location: L3-4 Injection technique: single-shot Needle Needle type: Sprotte  Needle gauge: 24 G Needle length: 9 cm Assessment Sensory level: T4 Additional Notes Patient identified.  Risk benefits discussed including failed block, incomplete pain control, headache, nerve damage, paralysis, blood pressure changes, nausea, vomiting, reactions to medication both toxic or allergic, and postpartum back pain.  Patient expressed understanding and wished to proceed.  All questions were answered.  Sterile technique used throughout procedure.  CSF was clear.  No parasthesia or other complications.  Please see nursing notes for vital signs.

## 2011-12-16 NOTE — Anesthesia Preprocedure Evaluation (Signed)
Anesthesia Evaluation  Patient identified by MRN, date of birth, ID band Patient awake    Reviewed: Allergy & Precautions, H&P , NPO status , Patient's Chart, lab work & pertinent test results  Airway Mallampati: II      Dental No notable dental hx.    Pulmonary neg pulmonary ROS, asthma ,  breath sounds clear to auscultation  Pulmonary exam normal       Cardiovascular Exercise Tolerance: Good negative cardio ROS  Rhythm:regular Rate:Normal     Neuro/Psych  Headaches, PSYCHIATRIC DISORDERS Anxiety Depression negative neurological ROS  negative psych ROS   GI/Hepatic negative GI ROS, Neg liver ROS,   Endo/Other  negative endocrine ROS  Renal/GU negative Renal ROS  negative genitourinary   Musculoskeletal   Abdominal Normal abdominal exam  (+)   Peds  Hematology negative hematology ROS (+)   Anesthesia Other Findings   Reproductive/Obstetrics (+) Pregnancy                           Anesthesia Physical Anesthesia Plan  ASA: II  Anesthesia Plan: Spinal   Post-op Pain Management:    Induction:   Airway Management Planned:   Additional Equipment:   Intra-op Plan:   Post-operative Plan:   Informed Consent: I have reviewed the patients History and Physical, chart, labs and discussed the procedure including the risks, benefits and alternatives for the proposed anesthesia with the patient or authorized representative who has indicated his/her understanding and acceptance.     Plan Discussed with: Anesthesiologist, CRNA and Surgeon  Anesthesia Plan Comments:         Anesthesia Quick Evaluation

## 2011-12-16 NOTE — Progress Notes (Signed)
Called Dr. Jean Rosenthal regarding patient's pain score of 6/10. Order for PCA received. Will initiate and continue to monitor.

## 2011-12-16 NOTE — Anesthesia Postprocedure Evaluation (Signed)
  Anesthesia Post-op Note  Patient: Brittany Cortez  Procedure(s) Performed: Procedure(s) (LRB): CESAREAN SECTION (N/A)   Patient is awake, responsive, moving her legs, and has signs of resolution of her numbness. Pain and nausea are reasonably well controlled. Vital signs are stable and clinically acceptable. Oxygen saturation is clinically acceptable. There are no apparent anesthetic complications at this time. Patient is ready for discharge.

## 2011-12-16 NOTE — Op Note (Signed)
Davina L Barnhart PROCEDURE DATE: 12/16/2011  PREOPERATIVE DIAGNOSIS: Intrauterine pregnancy at  [redacted]w[redacted]d weeks gestation; breech  POSTOPERATIVE DIAGNOSIS: The same  PROCEDURE: Primary Low Transverse Cesarean Section  SURGEON:  Dr. Duane Lope  ASSISTANT: Dr Candelaria Celeste  INDICATIONS: Brittany Cortez is a 26 y.o. G1P1001 at [redacted]w[redacted]d scheduled for cesarean section secondary to breech.  A bedside ultrasound was preformed, confirming breech position.  The risks of cesarean section discussed with the patient included but were not limited to: bleeding which may require transfusion or reoperation; infection which may require antibiotics; injury to bowel, bladder, ureters or other surrounding organs; injury to the fetus; need for additional procedures including hysterectomy in the event of a life-threatening hemorrhage; placental abnormalities wth subsequent pregnancies, incisional problems, thromboembolic phenomenon and other postoperative/anesthesia complications. The patient concurred with the proposed plan, giving informed written consent for the procedure.    FINDINGS:  Viable female infant in cephalic presentation.  Apgars 9 and 9, weight, 6 pounds and 5 ounces.  Clear amniotic fluid.  Intact placenta, three vessel cord.  Normal uterus, fallopian tubes and ovaries bilaterally.  ANESTHESIA:    Spinal INTRAVENOUS FLUIDS:3000 ml ESTIMATED BLOOD LOSS: 1000 ml URINE OUTPUT:  200 ml SPECIMENS: Placenta sent to L&D COMPLICATIONS: None immediate  PROCEDURE IN DETAIL:  The patient received intravenous antibiotics and had sequential compression devices applied to her lower extremities while in the preoperative area.  She was then taken to the operating room where spinal anesthesia was administered (epidural anesthesia was dosed up to surgical level) and was found to be adequate. She was then placed in a dorsal supine position with a leftward tilt, and prepped and draped in a sterile manner.  A foley catheter was  placed into her bladder and attached to constant gravity, which drained clear fluid throughout.  After an adequate timeout was performed, a Pfannenstiel skin incision was made with scalpel and carried through to the underlying layer of fascia. The fascia was incised in the midline and this incision was extended bilaterally using the Mayo scissors. Kocher clamps were applied to the superior aspect of the fascial incision and the underlying rectus muscles were dissected off bluntly. A similar process was carried out on the inferior aspect of the facial incision. The rectus muscles were separated in the midline bluntly and the peritoneum was entered bluntly. Attention was turned to the lower uterine segment where a transverse hysterotomy was made with a scalpel and extended bilaterally bluntly. The bladder blade was then removed. The head of the infant was brought to the hysterotomy and a vacuum was applied.  The vacuum was increased to 500 mm Hg.  The baby's head was then delivered after 1 pull.  The vacuum was then discontinued and the infant was successfully delivered, and cord was clamped and cut and infant was handed over to awaiting neonatology team. Uterine massage was then administered and the placenta delivered intact with three-vessel cord. The uterus was then cleared of clot and debris.  The hysterotomy was closed with 0 Monocryl in a running locked fashion, and an imbricating layer was also placed with a 0 monocryl. Overall, excellent hemostasis was noted. The abdomen and the pelvis were irrigated and cleared of all clot and debris. Hemostasis was confirmed on all surfaces.  The rectus muscles were reapproximated using 2-0 Chromic stitches. The fascia was then closed using 0 Vicryl in a running fashion.  The skin was closed with 4-0 Vicryl.  The patient tolerated the procedure well. Sponge, lap, instrument  and needle counts were correct x 2. She was taken to the recovery room in stable condition.     Candelaria Celeste JEHIEL DO 12/16/2011 7:08 PM

## 2011-12-17 ENCOUNTER — Encounter (HOSPITAL_COMMUNITY): Payer: Self-pay | Admitting: Obstetrics & Gynecology

## 2011-12-17 LAB — CBC
HCT: 30.5 % — ABNORMAL LOW (ref 36.0–46.0)
MCH: 30.4 pg (ref 26.0–34.0)
MCV: 90.8 fL (ref 78.0–100.0)
Platelets: 282 10*3/uL (ref 150–400)
RDW: 13.4 % (ref 11.5–15.5)
WBC: 13.6 10*3/uL — ABNORMAL HIGH (ref 4.0–10.5)

## 2011-12-17 MED ORDER — HYDROMORPHONE 0.3 MG/ML IV SOLN
INTRAVENOUS | Status: AC
  Filled 2011-12-17: qty 25

## 2011-12-17 MED ORDER — LACTATED RINGERS IV BOLUS (SEPSIS)
1000.0000 mL | Freq: Once | INTRAVENOUS | Status: AC
  Administered 2011-12-17: 1000 mL via INTRAVENOUS

## 2011-12-17 MED ORDER — RHO D IMMUNE GLOBULIN 1500 UNIT/2ML IJ SOLN
300.0000 ug | Freq: Once | INTRAMUSCULAR | Status: AC
  Administered 2011-12-17: 300 ug via INTRAMUSCULAR
  Filled 2011-12-17: qty 2

## 2011-12-17 NOTE — Addendum Note (Signed)
Addendum  created 12/17/11 0938 by Marylen Zuk R Nikkia Devoss, CRNA   Modules edited:Notes Section    

## 2011-12-17 NOTE — Progress Notes (Signed)
Patient's urine output for the last 5 hours was 125 cc concentrated urine. Roughly an hour and a half after IV bolus, output in foley bag looked clear and adequete. Attempted to readjust foley and see if it had pulled out some while patient was walking with no results. Brittany Cortez CNM called and notified. Will continue to monitor and report off to oncoming nurse.

## 2011-12-17 NOTE — Progress Notes (Signed)
Patient ID: Brittany Cortez, female   DOB: 02/03/1986, 26 y.o.   MRN: [id number] POD#) LTCS Since arrival on MBU 5 hrs ago has had 125cc UOP, concentrated. BPs are 90's/50's. Filed Vitals:   12/17/11 0150  BP:   Pulse:   Temp:   Resp: 12  81/54. Pitocin infusing. Per RN not bleeding. P: Check orthostatics and bolus IV LR 1000cc.

## 2011-12-17 NOTE — Progress Notes (Signed)
Post Partum Day 1 Subjective: no complaints, up ad lib, voiding, tolerating PO, + flatus and states that pain is well controlled with pain medication. Mom requests removal of urinary catheter. She states she will use IUD for contraception. She denies a BM as of yet but states that normally she has a BM once a week.  Mom reports some trouble with breast feeding and states she is looking forward to the lactation consult later today.   Objective: Blood pressure 92/56, pulse 82, temperature 97.9 F (36.6 C), temperature source Oral, resp. rate 18, weight 86.183 kg (190 lb), SpO2 100.00%, unknown if currently breastfeeding.  Physical Exam:  General: alert, cooperative and appears stated age Lochia: appropriate Uterine Fundus: firm. Fundal height at umbilicus.  Incision: healing well, no significant drainage, no dehiscence, no significant erythema DVT Evaluation: No evidence of DVT seen on physical exam. Negative Homan's sign. No cords or calf tenderness. Edema 1+    Basename 12/17/11 0600  HGB 10.2*  HCT 30.5*    Assessment/Plan: Breastfeeding, Lactation consult and Contraception IUD   LOS: 1 day   Iverson Alamin 12/17/2011, 7:44 AM   Patient independently seen and agree with above. Fawn Kirk 12/17/2011, 8:18AM

## 2011-12-17 NOTE — Progress Notes (Signed)

## 2011-12-17 NOTE — Progress Notes (Addendum)
Notified Deidre Poe CNM of patient's urine output from 2100 to now of 125 cc concentrated urine. Patient still receiving second bag of LR with pitocin and drinking some fluids po. Last BP in 80/40s range standing, but patient asymptomatic. Bleeding scant. Order placed. Will continue to monitor patient.

## 2011-12-17 NOTE — Anesthesia Postprocedure Evaluation (Signed)
  Anesthesia Post-op Note  Patient: Brittany Cortez  Procedure(s) Performed: Procedure(s) (LRB): CESAREAN SECTION (N/A)  Patient Location: PACU and Mother/Baby  Anesthesia Type: Spinal  Level of Consciousness: awake, alert  and oriented  Airway and Oxygen Therapy: Patient Spontanous Breathing  Post-op Pain: none  Post-op Assessment: Post-op Vital signs reviewed and Patient's Cardiovascular Status Stable  Post-op Vital Signs: Reviewed and stable  Complications: No apparent anesthesia complications

## 2011-12-17 NOTE — Progress Notes (Signed)
Patient very active, not resting. Made several walks to go out side. Encouraged resting and oral fluids.

## 2011-12-18 LAB — RH IG WORKUP (INCLUDES ABO/RH): Fetal Screen: NEGATIVE

## 2011-12-18 MED ORDER — OXYCODONE-ACETAMINOPHEN 5-325 MG PO TABS: 1.0000 | ORAL_TABLET | ORAL | Status: AC | PRN

## 2011-12-18 MED ORDER — IBUPROFEN 600 MG PO TABS: 600.0000 mg | ORAL_TABLET | Freq: Four times a day (QID) | ORAL | Status: AC

## 2011-12-18 NOTE — Progress Notes (Signed)
UR chart review completed.  

## 2011-12-18 NOTE — Discharge Summary (Signed)
Obstetric Discharge Summary Reason for Admission: cesarean section  Prenatal Procedures: NST and ultrasound Intrapartum Procedures: cesarean: low cervical, transverse Postpartum Procedures: none Complications-Operative and Postpartum: none Hemoglobin  Date Value Range Status  12/17/2011 10.2* 12.0-15.0 (g/dL) Final     HCT  Date Value Range Status  12/17/2011 30.5* 36.0-46.0 (%) Final    Physical Exam:  General: alert, cooperative and mild distress Lochia: appropriate Uterine Fundus: firm Incision: healing well, no significant drainage, no dehiscence, no significant erythema DVT Evaluation: No evidence of DVT seen on physical exam.  Discharge Diagnoses: Term Pregnancy-delivered  Discharge Information: Date: 12/18/2011 Activity: pelvic rest Diet: routine Medications: Ibuprofen and Percocet Condition: stable Instructions: refer to practice specific booklet Discharge to: home Follow-up Information    Follow up with [facility]. Schedule an appointment as soon as possible for a visit in 4 weeks.         Newborn Data: Live born female  Birth Weight: 6 lb 5.1 oz (2865 g) APGAR: 9, 9 Breast and Bottle feeding  Home with mother. Will follow up at [facility] in 4-6 weeks, or sooner if needed. Encouraged patient to keep incision clean and dry. Continue taking Motrin and Percocet as needed Mom interested in IUD for PP contraception  Brittany Cortez 12/18/2011, 7:24 AM

## 2011-12-18 NOTE — Discharge Summary (Signed)
Attestation of Attending Supervision of Resident: Evaluation and management procedures were performed by the [facility] Resident under my supervision.  I have reviewed the resident's note and chart, and I agree with management and plan.  Jaynie [place], M.D. 12/18/2011 8:09 AM

## 2011-12-18 NOTE — Progress Notes (Signed)
Subjective: Postpartum Day #2: Cesarean Delivery Patient reports incisional pain, tolerating PO, + flatus and no problems voiding.   Breast and bottle feeding. Wants IUD for PP contraception Still unsure of baby's pediatrician  Objective: Vital signs in last 24 hours: Temp:  [97.7 F (36.5 C)-98.4 F (36.9 C)] 97.8 F (36.6 C) (03/15 0518) Pulse Rate:  [82-114] 84  (03/15 0518) Resp:  [18-20] 18  (03/15 0518) BP: (98-130)/(59-77) 130/77 mmHg (03/15 0518) SpO2:  [98 %-99 %] 98 % (03/14 1823)  Physical Exam:  General: alert, cooperative and mild distress Lochia: appropriate Uterine Fundus: firm Incision: healing well, no significant drainage, no dehiscence DVT Evaluation: No evidence of DVT seen on physical exam.   Basename 12/17/11 0600  HGB 10.2*  HCT 30.5*    Assessment/Plan: Status post Cesarean section. Doing well postoperatively.  Discharge home with standard precautions and return to clinic in 4-6 weeks. Encouraged pt to keep incision clean and dry. Take IBU and Percocet as needed for pain. Will have lactation see patient prior to discharge.  Brittany Cortez 12/18/2011, 6:47 AM

## 2012-02-14 ENCOUNTER — Emergency Department (HOSPITAL_COMMUNITY)
Admission: EM | Admit: 2012-02-14 | Discharge: 2012-02-14 | Disposition: A | Discharge status: Home / Self Care | Payer: Medicaid Other | Attending: Emergency Medicine | Admitting: Emergency Medicine

## 2012-02-14 ENCOUNTER — Encounter (HOSPITAL_COMMUNITY): Payer: Self-pay | Admitting: Emergency Medicine

## 2012-02-14 DIAGNOSIS — F17200 Nicotine dependence, unspecified, uncomplicated: Secondary | ICD-10-CM | POA: Insufficient documentation

## 2012-02-14 DIAGNOSIS — S3140XA Unspecified open wound of vagina and vulva, initial encounter: Secondary | ICD-10-CM | POA: Insufficient documentation

## 2012-02-14 DIAGNOSIS — F411 Generalized anxiety disorder: Secondary | ICD-10-CM | POA: Insufficient documentation

## 2012-02-14 DIAGNOSIS — F3289 Other specified depressive episodes: Secondary | ICD-10-CM | POA: Insufficient documentation

## 2012-02-14 DIAGNOSIS — J45909 Unspecified asthma, uncomplicated: Secondary | ICD-10-CM | POA: Insufficient documentation

## 2012-02-14 DIAGNOSIS — F329 Major depressive disorder, single episode, unspecified: Secondary | ICD-10-CM | POA: Insufficient documentation

## 2012-02-14 DIAGNOSIS — S3141XA Laceration without foreign body of vagina and vulva, initial encounter: Secondary | ICD-10-CM

## 2012-02-14 DIAGNOSIS — X58XXXA Exposure to other specified factors, initial encounter: Secondary | ICD-10-CM | POA: Insufficient documentation

## 2012-02-14 LAB — BASIC METABOLIC PANEL
BUN: 17 mg/dL (ref 6–23)
Chloride: 105 mEq/L (ref 96–112)
Creatinine, Ser: 0.89 mg/dL (ref 0.50–1.10)
GFR calc Af Amer: >90 mL/min (ref >90)
GFR calc non Af Amer: >90 mL/min (ref >90)
Potassium: 3.4 mEq/L — ABNORMAL LOW (ref 3.5–5.1)

## 2012-02-14 LAB — DIFFERENTIAL
Basophils Absolute: 0.1 10*3/uL (ref 0.0–0.1)
Basophils Relative: 1 % (ref 0–1)
Eosinophils Absolute: 0.8 10*3/uL — ABNORMAL HIGH (ref 0.0–0.7)
Neutro Abs: 4 10*3/uL (ref 1.7–7.7)
Neutrophils Relative %: 41 % — ABNORMAL LOW (ref 43–77)

## 2012-02-14 LAB — SAMPLE TO BLOOD BANK

## 2012-02-14 LAB — CBC
MCH: 30.1 pg (ref 26.0–34.0)
MCHC: 33.9 g/dL (ref 30.0–36.0)
Platelets: 378 10*3/uL (ref 150–400)
RDW: 12.6 % (ref 11.5–15.5)

## 2012-02-14 LAB — HCG, SERUM, QUALITATIVE: Preg, Serum: NEGATIVE

## 2012-02-14 MED ORDER — OXYCODONE-ACETAMINOPHEN 5-325 MG PO TABS
1.0000 | ORAL_TABLET | Freq: Once | ORAL | Status: AC
  Administered 2012-02-14: 1 via ORAL
  Filled 2012-02-14: qty 1

## 2012-02-14 MED ORDER — HYDROCODONE-ACETAMINOPHEN 5-325 MG PO TABS: 1.0000 | ORAL_TABLET | ORAL | Status: AC | PRN

## 2012-02-14 MED ORDER — HYDROCODONE-ACETAMINOPHEN 5-325 MG PO TABS: 1.0000 | ORAL_TABLET | ORAL | Status: DC | PRN

## 2012-02-14 NOTE — ED Notes (Signed)
Pt states she feels like there is now a clot were the tear is. Pt is alert x 4, NAD.

## 2012-02-14 NOTE — ED Provider Notes (Signed)
History     CSN: [id number]  Arrival date & time 02/14/12  4098   First MD Initiated Contact with Patient 02/14/12 [phone]      Chief Complaint  Patient presents with  . Vaginal Bleeding     The history is provided by the patient.   The patient reports having intercourse with her boyfriend and in the middle of intercourse there is a large amount of blood noted in the bed and she began to bleed from her vagina.  The boyfriend reports that he thinks she has a cut on the right side of her vagina.  The patient has no abdominal pain.  This was consensual sex.  She is not on anticoagulants.  She is C-section 2 months ago and has a healthy 67-month-old child at home.  The patient is otherwise without complaints.  She does report moderate vaginal pain at this time.    Past Medical History  Diagnosis Date  . Asthma   . Headache   . Depression   . Anxiety     Past Surgical History  Procedure Date  . No past surgeries   . Cesarean section 12/16/2011    Procedure: CESAREAN SECTION;  Surgeon: Lazaro Arms, MD;  Location: [facility];  Service: Gynecology;  Laterality: N/A;    No family history on file.  History  Substance Use Topics  . Smoking status: Current Everyday Smoker -- 0.5 packs/day  . Smokeless tobacco: Not on file  . Alcohol Use: No    OB History    Grav Para Term Preterm Abortions TAB SAB Ect Mult Living   1 1 1       1       Review of Systems  All other systems reviewed and are negative.    Allergies  Darvocet  Home Medications   Current Outpatient Rx  Name Route Sig Dispense Refill  . FLUTICASONE-SALMETEROL 100-50 MCG/DOSE IN AEPB Inhalation Inhale 1 puff into the lungs daily.     Marland Kitchen PRENATAL MULTIVITAMIN CH Oral Take 1 tablet by mouth daily.    . VENLAFAXINE HCL ER 37.5 MG PO CP24 Oral Take 37.5 mg by mouth daily.      BP 107/60  Pulse 87  Temp(Src) 98 F (36.7 C) (Oral)  Resp 16  SpO2 97%  LMP 02/07/2012  Physical Exam  Nursing note and vitals  reviewed. Constitutional: She is oriented to person, place, and time. She appears well-developed and well-nourished. No distress.  HENT:  Head: Normocephalic and atraumatic.  Eyes: EOM are normal.  Neck: Normal range of motion.  Cardiovascular: Normal rate.   Pulmonary/Chest: Effort normal.  Abdominal: Soft. She exhibits no distension. There is no tenderness.  Genitourinary:       There is a 2.5 cm laceration of the right labia minora.  There is a small amount of bleeding.  There is also bruising of her right labia majora and left labia majora.  There is no bruising around the anus  Musculoskeletal: Normal range of motion.  Neurological: She is alert and oriented to person, place, and time.  Skin: Skin is warm and dry.  Psychiatric: She has a normal mood and affect. Judgment normal.    ED Course  Procedures (including critical care time)  LACERATION REPAIR Performed by: Lyanne Co Consent: Verbal consent obtained. Risks and benefits: risks, benefits and alternatives were discussed Patient identity confirmed: provided demographic data Time out performed prior to procedure Prepped and Draped in normal sterile fashion Wound explored Laceration Location:  Right labia minora Laceration Length: 2.5 cm  No Foreign Bodies seen or palpated Anesthesia: local infiltration Local anesthetic: lidocaine 2 % with epinephrine Anesthetic total: 4 ml Irrigation method: syringe Amount of cleaning: standard Skin closure: 4-0 chromic  Number of sutures or staples: 8  Technique: Simple interrupted  Patient tolerance: Patient tolerated the procedure well with no immediate complications.   Labs Reviewed  DIFFERENTIAL - Abnormal; Notable for the following:    Neutrophils Relative 41 (*)    Lymphs Abs 4.4 (*)    Eosinophils Relative 9 (*)    Eosinophils Absolute 0.8 (*)    All other components within normal limits  BASIC METABOLIC PANEL - Abnormal; Notable for the following:    Potassium  3.4 (*)    All other components within normal limits  CBC  SAMPLE TO BLOOD BANK  HCG, SERUM, QUALITATIVE  GC/CHLAMYDIA PROBE AMP, GENITAL  WET PREP, GENITAL   No results found.   1. Vaginal laceration       MDM  The patient is a vaginal laceration from intercourse with her boyfriend.  This was consensual intercourse.  The laceration was repaired.  There is no active bleeding after laceration repair.  Close gynecology followup with her gynecologist in [place], [place].  Infection warnings given        Lyanne Co, MD 02/14/12 [phone]

## 2012-02-14 NOTE — ED Notes (Signed)
This tech in room as witness for laceration repair.  Family remained at bedside.  Patient tolerated well.

## 2012-02-14 NOTE — Discharge Instructions (Signed)

## 2012-02-14 NOTE — ED Notes (Addendum)
PT. REPORTS VAGINAL BLEEDING THIS EVENING WITH LOW ABDOMINAL/VAGINAL PAIN , DENIES INJURY , NO NAUSEA OR VOMITTING / NO DIARRHEA.  PT.'S PARTNER STATES THEY HAD SEX PRIOR TO VAGINAL BLEEDING .

## 2012-10-17 ENCOUNTER — Observation Stay (HOSPITAL_COMMUNITY)
Admission: EM | Admit: 2012-10-17 | Discharge: 2012-10-18 | Disposition: A | Discharge status: Home / Self Care | Payer: Medicaid Other | Attending: Obstetrics and Gynecology | Admitting: Obstetrics and Gynecology

## 2012-10-17 ENCOUNTER — Emergency Department (HOSPITAL_COMMUNITY): Payer: Medicaid Other

## 2012-10-17 ENCOUNTER — Encounter (HOSPITAL_COMMUNITY): Payer: Self-pay | Admitting: *Deleted

## 2012-10-17 DIAGNOSIS — R109 Unspecified abdominal pain: Secondary | ICD-10-CM

## 2012-10-17 DIAGNOSIS — J45909 Unspecified asthma, uncomplicated: Secondary | ICD-10-CM | POA: Insufficient documentation

## 2012-10-17 DIAGNOSIS — N83209 Unspecified ovarian cyst, unspecified side: Principal | ICD-10-CM | POA: Insufficient documentation

## 2012-10-17 DIAGNOSIS — F17200 Nicotine dependence, unspecified, uncomplicated: Secondary | ICD-10-CM | POA: Insufficient documentation

## 2012-10-17 LAB — COMPREHENSIVE METABOLIC PANEL
Alkaline Phosphatase: 84 U/L (ref 39–117)
BUN: 17 mg/dL (ref 6–23)
CO2: 26 mEq/L (ref 19–32)
Chloride: 102 mEq/L (ref 96–112)
GFR calc Af Amer: >90 mL/min (ref >90)
GFR calc non Af Amer: >90 mL/min (ref >90)
Glucose, Bld: 94 mg/dL (ref 70–99)
Potassium: 3.6 mEq/L (ref 3.5–5.1)
Total Bilirubin: 0.3 mg/dL (ref 0.3–1.2)
Total Protein: 7.2 g/dL (ref 6.0–8.3)

## 2012-10-17 LAB — URINALYSIS, ROUTINE W REFLEX MICROSCOPIC
Bilirubin Urine: NEGATIVE
Ketones, ur: NEGATIVE mg/dL
Leukocytes, UA: NEGATIVE
Nitrite: POSITIVE — AB
Protein, ur: NEGATIVE mg/dL

## 2012-10-17 LAB — CBC WITH DIFFERENTIAL/PLATELET
Eosinophils Absolute: 0.8 10*3/uL — ABNORMAL HIGH (ref 0.0–0.7)
Hemoglobin: 13.3 g/dL (ref 12.0–15.0)
Lymphocytes Relative: 22 % (ref 12–46)
Lymphs Abs: 2.8 10*3/uL (ref 0.7–4.0)
MCH: 31.1 pg (ref 26.0–34.0)
Monocytes Relative: 7 % (ref 3–12)
Neutrophils Relative %: 65 % (ref 43–77)
RBC: 4.27 MIL/uL (ref 3.87–5.11)

## 2012-10-17 LAB — WET PREP, GENITAL

## 2012-10-17 MED ORDER — ONDANSETRON HCL 4 MG/2ML IJ SOLN: 4.0000 mg | Freq: Four times a day (QID) | INTRAMUSCULAR | Status: DC | PRN

## 2012-10-17 MED ORDER — IOHEXOL 300 MG/ML  SOLN
50.0000 mL | Freq: Once | INTRAMUSCULAR | Status: AC | PRN
  Administered 2012-10-17: 50 mL via ORAL

## 2012-10-17 MED ORDER — SODIUM CHLORIDE 0.9 % IJ SOLN: 9.0000 mL | INTRAMUSCULAR | Status: DC | PRN

## 2012-10-17 MED ORDER — BIOTENE DRY MOUTH MT LIQD: 15.0000 mL | Freq: Two times a day (BID) | OROMUCOSAL | Status: DC

## 2012-10-17 MED ORDER — SODIUM CHLORIDE 0.9 % IV BOLUS (SEPSIS)
1000.0000 mL | Freq: Once | INTRAVENOUS | Status: AC
  Administered 2012-10-17: 1000 mL via INTRAVENOUS

## 2012-10-17 MED ORDER — NICOTINE 14 MG/24HR TD PT24
14.0000 mg | MEDICATED_PATCH | Freq: Once | TRANSDERMAL | Status: AC
  Administered 2012-10-17: 14 mg via TRANSDERMAL
  Filled 2012-10-17: qty 1

## 2012-10-17 MED ORDER — ONDANSETRON HCL 4 MG/2ML IJ SOLN
INTRAMUSCULAR | Status: AC
  Administered 2012-10-17: 4 mg via INTRAVENOUS
  Filled 2012-10-17: qty 2

## 2012-10-17 MED ORDER — SODIUM CHLORIDE 0.9 % IV SOLN
INTRAVENOUS | Status: DC
  Administered 2012-10-18: 01:00:00 via INTRAVENOUS

## 2012-10-17 MED ORDER — SODIUM CHLORIDE 0.9 % IV SOLN
INTRAVENOUS | Status: DC
  Administered 2012-10-17 – 2012-10-18 (×2): via INTRAVENOUS

## 2012-10-17 MED ORDER — HYDROMORPHONE HCL PF 1 MG/ML IJ SOLN
0.5000 mg | Freq: Once | INTRAMUSCULAR | Status: AC
  Administered 2012-10-17: 0.5 mg via INTRAVENOUS
  Filled 2012-10-17: qty 1

## 2012-10-17 MED ORDER — IBUPROFEN 600 MG PO TABS: 600.0000 mg | ORAL_TABLET | Freq: Four times a day (QID) | ORAL | Status: DC | PRN

## 2012-10-17 MED ORDER — MORPHINE SULFATE 4 MG/ML IJ SOLN
4.0000 mg | Freq: Once | INTRAMUSCULAR | Status: AC
  Administered 2012-10-17: 4 mg via INTRAVENOUS
  Filled 2012-10-17: qty 1

## 2012-10-17 MED ORDER — MORPHINE SULFATE 4 MG/ML IJ SOLN
4.0000 mg | Freq: Once | INTRAMUSCULAR | Status: AC
  Administered 2012-10-17: 4 mg via INTRAVENOUS

## 2012-10-17 MED ORDER — NALOXONE HCL 0.4 MG/ML IJ SOLN: 0.4000 mg | INTRAMUSCULAR | Status: DC | PRN

## 2012-10-17 MED ORDER — OXYCODONE-ACETAMINOPHEN 5-325 MG PO TABS
1.0000 | ORAL_TABLET | ORAL | Status: DC | PRN
  Administered 2012-10-18: 2 via ORAL
  Filled 2012-10-17 (×2): qty 2

## 2012-10-17 MED ORDER — PRENATAL MULTIVITAMIN CH
1.0000 | ORAL_TABLET | Freq: Every day | ORAL | Status: DC
  Filled 2012-10-17 (×2): qty 1

## 2012-10-17 MED ORDER — DIPHENHYDRAMINE HCL 12.5 MG/5ML PO ELIX
12.5000 mg | ORAL_SOLUTION | Freq: Four times a day (QID) | ORAL | Status: DC | PRN
  Administered 2012-10-18: 12.5 mg via ORAL
  Filled 2012-10-17: qty 5

## 2012-10-17 MED ORDER — IOHEXOL 300 MG/ML  SOLN
100.0000 mL | Freq: Once | INTRAMUSCULAR | Status: AC | PRN
  Administered 2012-10-17: 100 mL via INTRAVENOUS

## 2012-10-17 MED ORDER — ONDANSETRON HCL 4 MG/2ML IJ SOLN
4.0000 mg | Freq: Once | INTRAMUSCULAR | Status: AC
  Administered 2012-10-17: 4 mg via INTRAVENOUS

## 2012-10-17 MED ORDER — ONDANSETRON HCL 4 MG/2ML IJ SOLN
4.0000 mg | Freq: Once | INTRAMUSCULAR | Status: AC
  Administered 2012-10-17: 4 mg via INTRAVENOUS
  Filled 2012-10-17: qty 2

## 2012-10-17 MED ORDER — DIPHENHYDRAMINE HCL 50 MG/ML IJ SOLN
12.5000 mg | Freq: Four times a day (QID) | INTRAMUSCULAR | Status: DC | PRN
  Administered 2012-10-18: 12.5 mg via INTRAVENOUS
  Filled 2012-10-17: qty 1

## 2012-10-17 MED ORDER — HYDROMORPHONE HCL PF 1 MG/ML IJ SOLN
INTRAMUSCULAR | Status: AC
  Administered 2012-10-17: 1 mg via INTRAVENOUS
  Filled 2012-10-17: qty 1

## 2012-10-17 MED ORDER — MORPHINE SULFATE 4 MG/ML IJ SOLN
INTRAMUSCULAR | Status: AC
  Administered 2012-10-17: 4 mg via INTRAVENOUS
  Filled 2012-10-17: qty 1

## 2012-10-17 MED ORDER — MORPHINE SULFATE (PF) 1 MG/ML IV SOLN
INTRAVENOUS | Status: DC
  Administered 2012-10-18 (×3): via INTRAVENOUS
  Filled 2012-10-17 (×3): qty 25

## 2012-10-17 MED ORDER — HYDROMORPHONE HCL PF 1 MG/ML IJ SOLN
1.0000 mg | Freq: Once | INTRAMUSCULAR | Status: AC
  Administered 2012-10-17: 1 mg via INTRAVENOUS

## 2012-10-17 MED ORDER — DEXTROSE 5 % IV SOLN
1.0000 g | Freq: Once | INTRAVENOUS | Status: AC
  Administered 2012-10-17: 1 g via INTRAVENOUS
  Filled 2012-10-17: qty 10

## 2012-10-17 NOTE — ED Provider Notes (Signed)
History     CSN: [id number]  Arrival date & time 10/17/12  1601   First MD Initiated Contact with Patient 10/17/12 1612      Chief Complaint  Patient presents with  . Abdominal Pain    (Consider location/radiation/quality/duration/timing/severity/associated sxs/prior treatment) HPI...Marland KitchenMarland KitchenG1 P1 status post cesarean section presents with right lower quadrant abdominal pain since 2 AM. Normal urination. Normal bowel movements. Has been eating. Has  birth control implant in left upper arm. No vaginal bleeding or discharge. No fever sweats or chills.severity is moderate. Palpation makes pain worse.  Past Medical History  Diagnosis Date  . Asthma   . Headache   . Depression   . Anxiety     Past Surgical History  Procedure Date  . No past surgeries   . Cesarean section 12/16/2011    Procedure: CESAREAN SECTION;  Surgeon: Lazaro Arms, MD;  Location: [facility];  Service: Gynecology;  Laterality: N/A;    No family history on file.  History  Substance Use Topics  . Smoking status: Current Every Day Smoker -- 0.5 packs/day  . Smokeless tobacco: Not on file  . Alcohol Use: No    OB History    Grav Para Term Preterm Abortions TAB SAB Ect Mult Living   1 1 1       1       Review of Systems  All other systems reviewed and are negative.    Allergies  Darvocet  Home Medications   Current Outpatient Rx  Name  Route  Sig  Dispense  Refill  . ACETAMINOPHEN 500 MG PO TABS   Oral   Take 1,000 mg by mouth daily as needed. For pain         . ALPRAZOLAM 0.5 MG PO TABS   Oral   Take 0.5 mg by mouth 3 (three) times daily as needed. For anxiety         . AMPHETAMINE-DEXTROAMPHET ER 20 MG PO CP24   Oral   Take 20 mg by mouth every morning.         Marland Kitchen FLUTICASONE-SALMETEROL 100-50 MCG/DOSE IN AEPB   Inhalation   Inhale 1 puff into the lungs daily.          . VENLAFAXINE HCL 75 MG PO TABS   Oral   Take 75 mg by mouth every morning.           BP 119/73  Pulse 95   Temp 97.7 F (36.5 C) (Oral)  Resp 20  Ht 5\' 7"  (1.702 m)  Wt 150 lb (68.04 kg)  BMI 23.49 kg/m2  SpO2 100%  LMP 10/01/2012  Breastfeeding? No  Physical Exam  Nursing note and vitals reviewed. Constitutional: She is oriented to person, place, and time. She appears well-developed and well-nourished.  HENT:  Head: Normocephalic and atraumatic.  Eyes: Conjunctivae normal and EOM are normal. Pupils are equal, round, and reactive to light.  Neck: Normal range of motion. Neck supple.  Cardiovascular: Normal rate, regular rhythm and normal heart sounds.   Pulmonary/Chest: Effort normal and breath sounds normal.  Abdominal: Soft. Bowel sounds are normal.       Abdomen is distended. Right lower quadrant tenderness.  Genitourinary:       Normal external genitalia. Cervix is closed. Slight amount of white discharge. Nontender. Right adnexal tenderness  Musculoskeletal: Normal range of motion.  Neurological: She is alert and oriented to person, place, and time.  Skin: Skin is warm and dry.  Psychiatric: She has a  normal mood and affect.    ED Course  Procedures (including critical care time)  Labs Reviewed  CBC WITH DIFFERENTIAL - Abnormal; Notable for the following:    WBC 13.0 (*)     Neutro Abs 8.5 (*)     Eosinophils Relative 6 (*)     Eosinophils Absolute 0.8 (*)     All other components within normal limits  URINALYSIS, ROUTINE W REFLEX MICROSCOPIC - Abnormal; Notable for the following:    APPearance CLOUDY (*)     Nitrite POSITIVE (*)     All other components within normal limits  URINE MICROSCOPIC-ADD ON - Abnormal; Notable for the following:    Squamous Epithelial / LPF FEW (*)     Bacteria, UA MANY (*)     All other components within normal limits  COMPREHENSIVE METABOLIC PANEL  PREGNANCY, URINE  URINE CULTURE   Ct Abdomen Pelvis W Contrast  10/17/2012  *RADIOLOGY REPORT*  Clinical Data: Right lower quadrant pain  CT ABDOMEN AND PELVIS WITH CONTRAST  Technique:   Multidetector CT imaging of the abdomen and pelvis was performed following the standard protocol during bolus administration of intravenous contrast.  Contrast: 50mL OMNIPAQUE IOHEXOL 300 MG/ML  SOLN, OMNIPAQUE IOHEXOL 300 MG/ML  SOLN  Comparison: None.  Findings: There is extensive stool burden throughout the transverse, descending, and sigmoid colon.  Moderate free fluid is layering the pelvis with elevated Hounsfield unit measurements.  This finding is seen with intraperitoneal hemorrhage or extravasated bowel contents.  4.0 x 2.4 cm left adnexal cystic lesion is present.  Unremarkable appearance of the uterus.  Right ovary is within normal limits.  Terminal ileum is within normal limits.  The appendix is not clearly visualized.  Liver, gallbladder, spleen, pancreas, adrenal glands, kidneys are within normal limits.  No destructive bone lesion.  IMPRESSION: There is moderate high-density free fluid in the pelvis suggesting peritoneal hemorrhage or extravasated bowel contents.  Ruptured ovarian hemorrhagic cyst can have a similar appearance.  Appendix not clearly visualized.  There is no obvious evidence of acute appendicitis.  Extensive stool burden in the transverse and distal colon.  Large cystic lesion in the left adnexa likely ovarian.  Ultrasound may be helpful to further characterize.   Original Report Authenticated By: Jolaine Click, M.D.      No diagnosis found.    MDM  Patient is very tender in the right lower quadrant. No cervical motion tenderness. CT scan reveals moderate  high density free fluid in the pelvis.  White count 13,000. Urinalysis reveals 7-10 white cells. IV Rocephin 1 g given. Discussed with Dr. Emelda Fear who will admit the patient.        Donnetta Hutching, MD 10/17/12 2137

## 2012-10-17 NOTE — H&P (Signed)
Brittany Cortez is an 27 y.o. female. She is admitted for evaluation of free pelvic fluid, mild elevated wbc, and possible uti based on u/a,. She wil be admitted for i.v. Analgesics, and serial labs, for what may likely represent a hemorrhagic cyst associated with ovulation, as she is menstrual day 16.  Pertinent Gynecological History: Menses: light due to implanon Bleeding:  Contraception: Nexplanon DES exposure: unknown Blood transfusions: none Sexually transmitted diseases: no past history Previous GYN Procedures:   Last mammogram:  Date:  Last pap:  Date:  OB History: G1, P1001   Menstrual History: Menarche age:  Patient's last menstrual period was 10/01/2012.    Past Medical History  Diagnosis Date  . Asthma   . Headache   . Depression   . Anxiety     Past Surgical History  Procedure Date  . No past surgeries   . Cesarean section 12/16/2011    Procedure: CESAREAN SECTION;  Surgeon: Lazaro Arms, MD;  Location: [facility];  Service: Gynecology;  Laterality: N/A;    No family history on file.  Social History:  reports that she has been smoking.  She does not have any smokeless tobacco history on file. She reports that she does not drink alcohol or use illicit drugs.  Allergies:  Allergies  Allergen Reactions  . Darvocet (Propoxyphene-Acetaminophen) Nausea And Vomiting     (Not in a hospital admission)  ROS  Blood pressure 108/63, pulse 99, temperature 97.7 F (36.5 C), temperature source Oral, resp. rate 20, height 5\' 7"  (1.702 m), weight 68.04 kg (150 lb), last menstrual period 10/01/2012, SpO2 99.00%, not currently breastfeeding. Physical Examper Dr Adriana Simas, see his note. BP 119/73  Pulse 95  Temp 97.7 F (36.5 C) (Oral)  Resp 20  Ht 5\' 7"  (1.702 m)  Wt 150 lb (68.04 kg)  BMI 23.49 kg/m2  SpO2 100%  LMP 10/01/2012  Breastfeeding? No  Physical Exam  Nursing note and vitals reviewed.  Constitutional: She is oriented to person, place, and time. She appears  well-developed and well-nourished.  HENT:  Head: Normocephalic and atraumatic.  Eyes: Conjunctivae normal and EOM are normal. Pupils are equal, round, and reactive to light.  Neck: Normal range of motion. Neck supple.  Cardiovascular: Normal rate, regular rhythm and normal heart sounds.  Pulmonary/Chest: Effort normal and breath sounds normal.  Abdominal: Soft. Bowel sounds are normal.  Abdomen is distended. Right lower quadrant tenderness.  Genitourinary:  Normal external genitalia. Cervix is closed. Slight amount of white discharge. Nontender. Right adnexal tenderness  Musculoskeletal: Normal range of motion.  Neurological: She is alert and oriented to person, place, and time.  Skin: Skin is warm and dry.  Psychiatric: She has a normal mood and affect.   Results for orders placed during the hospital encounter of 10/17/12 (from the past 24 hour(s))  CBC WITH DIFFERENTIAL     Status: Abnormal   Collection Time   10/17/12  6:07 PM      Component Value Range   WBC 13.0 (*) 4.0 - 10.5 K/uL   RBC 4.27  3.87 - 5.11 MIL/uL   Hemoglobin 13.3  12.0 - 15.0 g/dL   HCT 30.8  65.7 - 84.6 %   MCV 90.9  78.0 - 100.0 fL   MCH 31.1  26.0 - 34.0 pg   MCHC 34.3  30.0 - 36.0 g/dL   RDW 96.2  95.2 - 84.1 %   Platelets 355  150 - 400 K/uL   Neutrophils Relative 65  43 -  77 %   Neutro Abs 8.5 (*) 1.7 - 7.7 K/uL   Lymphocytes Relative 22  12 - 46 %   Lymphs Abs 2.8  0.7 - 4.0 K/uL   Monocytes Relative 7  3 - 12 %   Monocytes Absolute 0.9  0.1 - 1.0 K/uL   Eosinophils Relative 6 (*) 0 - 5 %   Eosinophils Absolute 0.8 (*) 0.0 - 0.7 K/uL   Basophils Relative 1  0 - 1 %   Basophils Absolute 0.1  0.0 - 0.1 K/uL  COMPREHENSIVE METABOLIC PANEL     Status: Normal   Collection Time   10/17/12  6:07 PM      Component Value Range   Sodium 139  135 - 145 mEq/L   Potassium 3.6  3.5 - 5.1 mEq/L   Chloride 102  96 - 112 mEq/L   CO2 26  19 - 32 mEq/L   Glucose, Bld 94  70 - 99 mg/dL   BUN 17  6 - 23 mg/dL    Creatinine, Ser 1.61  0.50 - 1.10 mg/dL   Calcium 9.0  8.4 - 09.6 mg/dL   Total Protein 7.2  6.0 - 8.3 g/dL   Albumin 3.8  3.5 - 5.2 g/dL   AST 12  0 - 37 U/L   ALT 13  0 - 35 U/L   Alkaline Phosphatase 84  39 - 117 U/L   Total Bilirubin 0.3  0.3 - 1.2 mg/dL   GFR calc non Af Amer >90  >90 mL/min   GFR calc Af Amer >90  >90 mL/min  URINALYSIS, ROUTINE W REFLEX MICROSCOPIC     Status: Abnormal   Collection Time   10/17/12  6:10 PM      Component Value Range   Color, Urine YELLOW  YELLOW   APPearance CLOUDY (*) CLEAR   Specific Gravity, Urine 1.010  1.005 - 1.030   pH 6.0  5.0 - 8.0   Glucose, UA NEGATIVE  NEGATIVE mg/dL   Hgb urine dipstick NEGATIVE  NEGATIVE   Bilirubin Urine NEGATIVE  NEGATIVE   Ketones, ur NEGATIVE  NEGATIVE mg/dL   Protein, ur NEGATIVE  NEGATIVE mg/dL   Urobilinogen, UA 0.2  0.0 - 1.0 mg/dL   Nitrite POSITIVE (*) NEGATIVE   Leukocytes, UA NEGATIVE  NEGATIVE  PREGNANCY, URINE     Status: Normal   Collection Time   10/17/12  6:10 PM      Component Value Range   Preg Test, Ur NEGATIVE  NEGATIVE  URINE MICROSCOPIC-ADD ON     Status: Abnormal   Collection Time   10/17/12  6:10 PM      Component Value Range   Squamous Epithelial / LPF FEW (*) RARE   WBC, UA 7-10  <3 WBC/hpf   Bacteria, UA MANY (*) RARE  WET PREP, GENITAL     Status: Abnormal   Collection Time   10/17/12  9:45 PM      Component Value Range   Yeast Wet Prep HPF POC NONE SEEN  NONE SEEN   Trich, Wet Prep NONE SEEN  NONE SEEN   Clue Cells Wet Prep HPF POC FEW (*) NONE SEEN   WBC, Wet Prep HPF POC RARE (*) NONE SEEN    Ct Abdomen Pelvis W Contrast  10/17/2012  *RADIOLOGY REPORT*  Clinical Data: Right lower quadrant pain  CT ABDOMEN AND PELVIS WITH CONTRAST  Technique:  Multidetector CT imaging of the abdomen and pelvis was performed following the standard protocol  during bolus administration of intravenous contrast.  Contrast: 50mL OMNIPAQUE IOHEXOL 300 MG/ML  SOLN, OMNIPAQUE IOHEXOL 300  MG/ML  SOLN  Comparison: None.  Findings: There is extensive stool burden throughout the transverse, descending, and sigmoid colon.  Moderate free fluid is layering the pelvis with elevated Hounsfield unit measurements.  This finding is seen with intraperitoneal hemorrhage or extravasated bowel contents.  4.0 x 2.4 cm left adnexal cystic lesion is present.  Unremarkable appearance of the uterus.  Right ovary is within normal limits.  Terminal ileum is within normal limits.  The appendix is not clearly visualized.  Liver, gallbladder, spleen, pancreas, adrenal glands, kidneys are within normal limits.  No destructive bone lesion.  IMPRESSION: There is moderate high-density free fluid in the pelvis suggesting peritoneal hemorrhage or extravasated bowel contents.  Ruptured ovarian hemorrhagic cyst can have a similar appearance.  Appendix not clearly visualized.  There is no obvious evidence of acute appendicitis.  Extensive stool burden in the transverse and distal colon.  Large cystic lesion in the left adnexa likely ovarian.  Ultrasound may be helpful to further characterize.   Original Report Authenticated By: Jolaine Click, M.D.     Assessment/Plan: Pelvic , RLQ pain, felt secondary to ovulation despite Nexplanon. Plan:  U/s in am, serial labs, analgesics, Dilaudid PCA.  Eveny Anastas V 10/17/2012, 10:32 PM

## 2012-10-17 NOTE — ED Notes (Signed)
Attempted to call report, nurse stated she would call me back.

## 2012-10-17 NOTE — ED Notes (Signed)
Pt states sever lower abdominal pain, described as intermittent stabbing pain and shooting pains to right neck at times.

## 2012-10-18 ENCOUNTER — Observation Stay (HOSPITAL_COMMUNITY): Payer: Medicaid Other

## 2012-10-18 DIAGNOSIS — N83209 Unspecified ovarian cyst, unspecified side: Secondary | ICD-10-CM | POA: Diagnosis present

## 2012-10-18 LAB — CBC WITH DIFFERENTIAL/PLATELET
Basophils Absolute: 0.1 10*3/uL (ref 0.0–0.1)
Basophils Relative: 1 % (ref 0–1)
Eosinophils Absolute: 0.8 10*3/uL — ABNORMAL HIGH (ref 0.0–0.7)
Eosinophils Relative: 8 % — ABNORMAL HIGH (ref 0–5)
Lymphs Abs: 3 10*3/uL (ref 0.7–4.0)
MCH: 30.8 pg (ref 26.0–34.0)
MCHC: 33.6 g/dL (ref 30.0–36.0)
MCV: 91.7 fL (ref 78.0–100.0)
Neutrophils Relative %: 54 % (ref 43–77)
Platelets: 321 10*3/uL (ref 150–400)
RBC: 3.73 MIL/uL — ABNORMAL LOW (ref 3.87–5.11)
RDW: 13 % (ref 11.5–15.5)

## 2012-10-18 MED ORDER — OXYCODONE-ACETAMINOPHEN 5-325 MG PO TABS: 1.0000 | ORAL_TABLET | ORAL | Status: DC | PRN

## 2012-10-18 MED ORDER — OXYCODONE-ACETAMINOPHEN 5-325 MG PO TABS
1.0000 | ORAL_TABLET | Freq: Four times a day (QID) | ORAL | Status: DC | PRN
  Administered 2012-10-18: 2 via ORAL

## 2012-10-18 MED ORDER — VENLAFAXINE HCL 37.5 MG PO TABS
75.0000 mg | ORAL_TABLET | Freq: Every morning | ORAL | Status: DC
  Administered 2012-10-18: 75 mg via ORAL
  Filled 2012-10-18: qty 2

## 2012-10-18 MED ORDER — ALPRAZOLAM 0.5 MG PO TABS
0.5000 mg | ORAL_TABLET | Freq: Once | ORAL | Status: AC
  Administered 2012-10-18: 0.5 mg via ORAL
  Filled 2012-10-18: qty 1

## 2012-10-18 NOTE — Progress Notes (Signed)
UR Chart Review Completed  

## 2012-10-18 NOTE — Care Management Note (Unsigned)
    Page 1 of 1   10/18/2012     11:32:07 AM   CARE MANAGEMENT NOTE 10/18/2012  Patient:  SHAQUETTA, ARCOS   Account Number:  [id number]  Date Initiated:  10/18/2012  Documentation initiated by:  Sharrie Rothman  Subjective/Objective Assessment:   Pt admitted from home with abd pain. Pt lives with her mother and 35 month old baby. Pt will return home and is independent with ADL's. Pt is for potential discharge later today.     Action/Plan:   No CM or HH needs noted.   Anticipated DC Date:  10/18/2012   Anticipated DC Plan:  HOME/SELF CARE      DC Planning Services  CM consult      Choice offered to / List presented to:             Status of service:  Completed, signed off Medicare Important Message given?   (If response is "NO", the following Medicare IM given date fields will be blank) Date Medicare IM given:   Date Additional Medicare IM given:    Discharge Disposition:  HOME/SELF CARE  Per UR Regulation:    If discussed at Long Length of Stay Meetings, dates discussed:    Comments:  10/18/12 1130 Arlyss Queen, RN BSN CM

## 2012-10-18 NOTE — Discharge Summary (Signed)
Physician Discharge Summary  Patient ID: Brittany Cortez MRN: [id number] DOB/AGE: 08-Sep-1986 26 y.o.  Admit date: 10/17/2012 Discharge date: 10/18/2012  Admission Diagnoses:abdominal pain, hemorrhagic ovarian cyst  Discharge Diagnoses: Abdominal pain, resolved     Hemorrhagic ovarian cyst  Active Problems:  * No active hospital problems. *    Discharged Condition: good  Hospital Course: Brittany Cortez was admitted through the emergency room on 1/13 2014 She was 16 days since her last menstrual period, and presented with abdominal pain bloating and generalized discomfort. She rated her pain is an 8/10 on admission. Labs obtained which included a mildly elevated white count urinalysis essentially negative, with CT scan showing free fluid in the pelvis and a left ovarian cyst. Interestingly the pain is primarily right-sided she was admitted for pain management and serial exams to rule out continued intra-abdominal bleeding  Consults: gynecology  Significant Diagnostic Studies: labs .   Treatments: IV hydration and analgesia: Vicodin and Dilaudid  Discharge Exam: Blood pressure 95/53, pulse 71, temperature 98 F (36.7 C), temperature source Oral, resp. rate 16, height 5\' 7"  (1.702 m), weight 78.744 kg (173 lb 9.6 oz), last menstrual period 10/01/2012, SpO2 96.00%, not currently breastfeeding. Abdomen was soft slightly bloating bowel sounds present no rebound tenderness  Disposition: 01-Home or Self Care     Medication List     As of 10/18/2012  6:06 PM    ASK your doctor about these medications         acetaminophen 500 MG tablet   Commonly known as: TYLENOL   Take 1,000 mg by mouth daily as needed. For pain      ALPRAZolam 0.5 MG tablet   Commonly known as: XANAX   Take 0.5 mg by mouth 3 (three) times daily as needed. For anxiety      amphetamine-dextroamphetamine 20 MG 24 hr capsule   Commonly known as: ADDERALL XR   Take 20 mg by mouth every morning.     Fluticasone-Salmeterol 100-50 MCG/DOSE Aepb   Commonly known as: ADVAIR   Inhale 1 puff into the lungs daily.      venlafaxine 75 MG tablet   Commonly known as: EFFEXOR   Take 75 mg by mouth every morning.        discharge meds include Percocet for pain and resumption of the medicines listed above.  SignedTilda Burrow 10/18/2012, 6:06 PM

## 2012-10-18 NOTE — Progress Notes (Signed)
States understanding of discharge,prescriptons given

## 2012-10-19 LAB — GC/CHLAMYDIA PROBE AMP: GC Probe RNA: NEGATIVE

## 2012-10-19 LAB — URINE CULTURE: Colony Count: >=100000

## 2013-01-18 ENCOUNTER — Encounter: Payer: Self-pay | Admitting: *Deleted

## 2013-01-18 DIAGNOSIS — J45909 Unspecified asthma, uncomplicated: Secondary | ICD-10-CM | POA: Insufficient documentation

## 2013-01-18 DIAGNOSIS — F1110 Opioid abuse, uncomplicated: Secondary | ICD-10-CM | POA: Insufficient documentation

## 2013-01-19 ENCOUNTER — Ambulatory Visit (INDEPENDENT_AMBULATORY_CARE_PROVIDER_SITE_OTHER): Payer: Medicaid Other | Admitting: Obstetrics & Gynecology

## 2013-01-19 ENCOUNTER — Encounter: Payer: Self-pay | Admitting: Advanced Practice Midwife

## 2013-01-19 VITALS — BP 110/80 | Wt 158.0 lb

## 2013-01-19 DIAGNOSIS — R102 Pelvic and perineal pain: Secondary | ICD-10-CM

## 2013-01-19 DIAGNOSIS — IMO0001 Reserved for inherently not codable concepts without codable children: Secondary | ICD-10-CM

## 2013-01-19 DIAGNOSIS — D649 Anemia, unspecified: Secondary | ICD-10-CM

## 2013-01-19 DIAGNOSIS — Z3046 Encounter for surveillance of implantable subdermal contraceptive: Secondary | ICD-10-CM

## 2013-01-19 DIAGNOSIS — Z3202 Encounter for pregnancy test, result negative: Secondary | ICD-10-CM

## 2013-01-19 LAB — POCT URINE PREGNANCY: Preg Test, Ur: NEGATIVE

## 2013-01-19 NOTE — Patient Instructions (Signed)
When starting Viibryd, Take Viibryd in the morning and Effexor in the evening for one week.  If withdrawal occurs (ringing in the ears), may take Effexor every other or every 2 days.

## 2013-01-19 NOTE — Progress Notes (Signed)
Brittany Cortez had had Nexplanon for almost a year and still bleeds "all the time". Wants Nuva Ring. Patient given informed consent for removal of her Implanon, time out was performed.  Signed copy in the chart.  Appropriate time out taken. Implanon site identified.  Area prepped in usual sterile fashon. One cc of 1% lidocaine was used to anesthetize the area at the distal end of the implant. A small stab incision was made right beside the implant on the distal portion.  The implanon rod was grasped using hemostats and removed without difficulty.  There was less than 3 cc blood loss. There were no complications.  A small amount of antibiotic ointment and steri-strips were applied over the small incision.  A pressure bandage was applied to reduce any bruising.  The patient tolerated the procedure well and was given post procedure instructions.  Pt c/o LLQ pain "feels like it did when I had a cyst in January"  .  WIll schedule pelvic u/s  C/O effexor not working anymore. Feels angry and has mood swings. Recommended talk therapy and to see psychiatrist at [facility].  Will change SSNI to Viibryd per Dr. Despina Hidden.

## 2013-01-23 ENCOUNTER — Telehealth: Payer: Self-pay | Admitting: *Deleted

## 2013-01-23 ENCOUNTER — Encounter: Payer: Self-pay | Admitting: *Deleted

## 2013-01-23 ENCOUNTER — Telehealth: Payer: Self-pay | Admitting: Advanced Practice Midwife

## 2013-01-23 NOTE — Telephone Encounter (Signed)
Pt stated that Drenda Freeze was supposed to call in her antidepressant, pain med, and birth control. The birth control was called into [facility] on 01/23/13. Pt aware you would not be in until tomorrow. Can you please call in the medications.

## 2013-01-23 NOTE — Telephone Encounter (Signed)
Nuva Ring disp 1, use as directed with 1 year of refills called to [facility] per [employer]. Left messages on both voicemail to return the call.

## 2013-01-24 ENCOUNTER — Other Ambulatory Visit: Payer: Self-pay | Admitting: Obstetrics & Gynecology

## 2013-01-24 MED ORDER — ETONOGESTREL-ETHINYL ESTRADIOL 0.12-0.015 MG/24HR VA RING: VAGINAL_RING | VAGINAL | Status: DC

## 2013-01-24 MED ORDER — NAPROXEN SODIUM 550 MG PO TABS: 550.0000 mg | ORAL_TABLET | Freq: Three times a day (TID) | ORAL | Status: DC

## 2013-01-24 MED ORDER — VILAZODONE HCL 40 MG PO TABS: 40.0000 mg | ORAL_TABLET | Freq: Every day | ORAL | Status: DC

## 2013-01-31 ENCOUNTER — Other Ambulatory Visit: Payer: Medicaid Other

## 2013-03-26 ENCOUNTER — Emergency Department (HOSPITAL_COMMUNITY): Payer: Medicaid Other

## 2013-03-26 ENCOUNTER — Encounter (HOSPITAL_COMMUNITY): Payer: Self-pay | Admitting: Emergency Medicine

## 2013-03-26 ENCOUNTER — Emergency Department (HOSPITAL_COMMUNITY)
Admission: EM | Admit: 2013-03-26 | Discharge: 2013-03-26 | Disposition: A | Discharge status: Home / Self Care | Payer: Medicaid Other | Attending: Emergency Medicine | Admitting: Emergency Medicine

## 2013-03-26 DIAGNOSIS — J45909 Unspecified asthma, uncomplicated: Secondary | ICD-10-CM | POA: Insufficient documentation

## 2013-03-26 DIAGNOSIS — IMO0002 Reserved for concepts with insufficient information to code with codable children: Secondary | ICD-10-CM | POA: Insufficient documentation

## 2013-03-26 DIAGNOSIS — W01119A Fall on same level from slipping, tripping and stumbling with subsequent striking against unspecified sharp object, initial encounter: Secondary | ICD-10-CM | POA: Insufficient documentation

## 2013-03-26 DIAGNOSIS — S41112A Laceration without foreign body of left upper arm, initial encounter: Secondary | ICD-10-CM

## 2013-03-26 DIAGNOSIS — F329 Major depressive disorder, single episode, unspecified: Secondary | ICD-10-CM | POA: Insufficient documentation

## 2013-03-26 DIAGNOSIS — F17200 Nicotine dependence, unspecified, uncomplicated: Secondary | ICD-10-CM | POA: Insufficient documentation

## 2013-03-26 DIAGNOSIS — F3289 Other specified depressive episodes: Secondary | ICD-10-CM | POA: Insufficient documentation

## 2013-03-26 DIAGNOSIS — S51809A Unspecified open wound of unspecified forearm, initial encounter: Secondary | ICD-10-CM | POA: Insufficient documentation

## 2013-03-26 DIAGNOSIS — F411 Generalized anxiety disorder: Secondary | ICD-10-CM | POA: Insufficient documentation

## 2013-03-26 DIAGNOSIS — Z8669 Personal history of other diseases of the nervous system and sense organs: Secondary | ICD-10-CM | POA: Insufficient documentation

## 2013-03-26 DIAGNOSIS — Y9389 Activity, other specified: Secondary | ICD-10-CM | POA: Insufficient documentation

## 2013-03-26 DIAGNOSIS — Z79899 Other long term (current) drug therapy: Secondary | ICD-10-CM | POA: Insufficient documentation

## 2013-03-26 DIAGNOSIS — Y929 Unspecified place or not applicable: Secondary | ICD-10-CM | POA: Insufficient documentation

## 2013-03-26 MED ORDER — BACITRACIN ZINC 500 UNIT/GM EX OINT
TOPICAL_OINTMENT | CUTANEOUS | Status: AC
  Filled 2013-03-26: qty 0.9

## 2013-03-26 MED ORDER — OXYCODONE-ACETAMINOPHEN 5-325 MG PO TABS
1.0000 | ORAL_TABLET | Freq: Once | ORAL | Status: AC
  Administered 2013-03-26: 1 via ORAL
  Filled 2013-03-26: qty 1

## 2013-03-26 MED ORDER — LIDOCAINE HCL (PF) 1 % IJ SOLN
INTRAMUSCULAR | Status: AC
  Administered 2013-03-26: 5 mL
  Filled 2013-03-26: qty 5

## 2013-03-26 MED ORDER — IBUPROFEN 800 MG PO TABS
800.0000 mg | ORAL_TABLET | Freq: Once | ORAL | Status: AC
  Administered 2013-03-26: 800 mg via ORAL
  Filled 2013-03-26: qty 1

## 2013-03-26 NOTE — ED Provider Notes (Signed)
History     CSN: [id number]  Arrival date & time 03/26/13  0302   First MD Initiated Contact with Patient 03/26/13 [phone]      Chief Complaint  Patient presents with  . Extremity Laceration    (Consider location/radiation/quality/duration/timing/severity/associated sxs/prior treatment) Patient is a 27 y.o. female presenting with arm injury. The history is provided by the patient (the pt fell and cut her left arm).  Arm Injury Location:  Arm Time since incident: today. Injury: yes   Mechanism of injury: fall   Fall:    Fall occurred:  Standing Arm location:  L arm Associated symptoms: no back pain and no fatigue     Past Medical History  Diagnosis Date  . Asthma   . Headache(784.0)   . Depression   . Anxiety   . Narcotic abuse     history of     Past Surgical History  Procedure Laterality Date  . No past surgeries    . Cesarean section  12/16/2011    Procedure: CESAREAN SECTION;  Surgeon: Lazaro Arms, MD;  Location: [facility];  Service: Gynecology;  Laterality: N/A;  . Ovarian cyst drainage  2013    Family History  Problem Relation Age of Onset  . Heart attack Mother   . Diabetes Father   . Cancer Maternal Grandfather     lung    History  Substance Use Topics  . Smoking status: Current Every Day Smoker -- 0.50 packs/day    Types: Cigarettes  . Smokeless tobacco: Current User  . Alcohol Use: Yes     Comment: rarely    OB History   Grav Para Term Preterm Abortions TAB SAB Ect Mult Living   1 1 1       1       Review of Systems  Constitutional: Negative for appetite change and fatigue.  HENT: Negative for congestion, sinus pressure and ear discharge.   Eyes: Negative for discharge.  Respiratory: Negative for cough.   Cardiovascular: Negative for chest pain.  Gastrointestinal: Negative for abdominal pain and diarrhea.  Genitourinary: Negative for frequency and hematuria.  Musculoskeletal: Negative for back pain.  Skin: Negative for rash.   Neurological: Negative for seizures and headaches.  Psychiatric/Behavioral: Negative for hallucinations.    Allergies  Darvocet  Home Medications   Current Outpatient Rx  Name  Route  Sig  Dispense  Refill  . acetaminophen (TYLENOL) 500 MG tablet   Oral   Take 1,000 mg by mouth daily as needed. For pain         . ALPRAZolam (XANAX) 0.5 MG tablet   Oral   Take 0.5 mg by mouth 3 (three) times daily as needed. For anxiety         . amphetamine-dextroamphetamine (ADDERALL XR) 20 MG 24 hr capsule   Oral   Take 20 mg by mouth every morning.         . etonogestrel-ethinyl estradiol (NUVARING) 0.12-0.015 MG/24HR vaginal ring      Insert vaginally and leave in place for 3 consecutive weeks, then remove for 1 week.   1 each   12   . Fluticasone-Salmeterol (ADVAIR) 100-50 MCG/DOSE AEPB   Inhalation   Inhale 1 puff into the lungs daily.          . Vilazodone HCl (VIIBRYD) 40 MG TABS   Oral   Take 1 tablet (40 mg total) by mouth daily.   30 tablet   11   . Etonogestrel (NEXPLANON [place])  Subcutaneous   Inject into the skin.         . naproxen sodium (ANAPROX DS) 550 MG tablet   Oral   Take 1 tablet (550 mg total) by mouth 3 (three) times daily with meals.   60 tablet   2   . oxyCODONE-acetaminophen (PERCOCET/ROXICET) 5-325 MG per tablet   Oral   Take 1-2 tablets by mouth every 3 (three) hours as needed (moderate to severe pain (when tolerating fluids)).   30 tablet   0   . venlafaxine (EFFEXOR) 75 MG tablet   Oral   Take 75 mg by mouth every morning.           BP 115/75  Pulse 89  Temp(Src) 98.4 F (36.9 C)  Resp 24  Ht 5\' 6"  (1.676 m)  Wt 153 lb (69.4 kg)  BMI 24.71 kg/m2  SpO2 97%  LMP 03/19/2013  Physical Exam  Constitutional: She is oriented to person, place, and time. She appears well-developed.  HENT:  Head: Normocephalic.  Eyes: Conjunctivae are normal.  Neck: No tracheal deviation present.  Cardiovascular:  No murmur  heard. Musculoskeletal: Normal range of motion.  Tenderness left forearm.   3 cm lac,  Neuro vasc nl  Neurological: She is oriented to person, place, and time.  Skin: Skin is warm.  Psychiatric: She has a normal mood and affect.    ED Course  LACERATION REPAIR Date/Time: 03/26/2013 6:18 AM Performed by: Valon Glasscock L Authorized by: Bethann Berkshire L Comments: 3 cm lac left forearm.  Lido no epi,  5, 4-0 sutures used to close the lac   (including critical care time)  Labs Reviewed - No data to display Dg Forearm Right  03/26/2013   *RADIOLOGY REPORT*  Clinical Data: Larey Seat onto chainsaw, laceration posterior left forearm  RIGHT FOREARM - 2 VIEW  Comparison: None  Findings: Bone mineralization normal. Joint spaces preserved. No fracture, dislocation, or bone destruction. No radiopaque foreign body or focal osseous abnormality identified.  IMPRESSION: No acute osseous abnormalities.   Original Report Authenticated By: Ulyses Southward, M.D.     1. Arm laceration, left, initial encounter       MDM          Benny Lennert, MD 03/26/13 [phone]

## 2013-03-26 NOTE — ED Notes (Signed)
Patients husband requested something for her pain.  Offered tylenol or ibuprofen until seen by MD.  Husband commenting he wants someone to come in who can actually do something for his wife and stop asking these questions.

## 2013-03-26 NOTE — ED Notes (Signed)
Laceration to forearm - fell on a chain saw on garage floor

## 2013-05-31 ENCOUNTER — Emergency Department (HOSPITAL_COMMUNITY): Payer: Medicaid Other

## 2013-05-31 ENCOUNTER — Emergency Department (HOSPITAL_COMMUNITY)
Admission: EM | Admit: 2013-05-31 | Discharge: 2013-05-31 | Disposition: A | Discharge status: Home / Self Care | Payer: Medicaid Other | Attending: Emergency Medicine | Admitting: Emergency Medicine

## 2013-05-31 ENCOUNTER — Encounter (HOSPITAL_COMMUNITY): Payer: Self-pay | Admitting: *Deleted

## 2013-05-31 DIAGNOSIS — Z79899 Other long term (current) drug therapy: Secondary | ICD-10-CM | POA: Insufficient documentation

## 2013-05-31 DIAGNOSIS — N83209 Unspecified ovarian cyst, unspecified side: Secondary | ICD-10-CM | POA: Insufficient documentation

## 2013-05-31 DIAGNOSIS — J45909 Unspecified asthma, uncomplicated: Secondary | ICD-10-CM | POA: Insufficient documentation

## 2013-05-31 DIAGNOSIS — Z3202 Encounter for pregnancy test, result negative: Secondary | ICD-10-CM | POA: Insufficient documentation

## 2013-05-31 DIAGNOSIS — F411 Generalized anxiety disorder: Secondary | ICD-10-CM | POA: Insufficient documentation

## 2013-05-31 DIAGNOSIS — N390 Urinary tract infection, site not specified: Secondary | ICD-10-CM | POA: Insufficient documentation

## 2013-05-31 DIAGNOSIS — F329 Major depressive disorder, single episode, unspecified: Secondary | ICD-10-CM | POA: Insufficient documentation

## 2013-05-31 DIAGNOSIS — F17200 Nicotine dependence, unspecified, uncomplicated: Secondary | ICD-10-CM | POA: Insufficient documentation

## 2013-05-31 DIAGNOSIS — R300 Dysuria: Secondary | ICD-10-CM | POA: Insufficient documentation

## 2013-05-31 DIAGNOSIS — K5900 Constipation, unspecified: Secondary | ICD-10-CM | POA: Insufficient documentation

## 2013-05-31 DIAGNOSIS — R3911 Hesitancy of micturition: Secondary | ICD-10-CM | POA: Insufficient documentation

## 2013-05-31 DIAGNOSIS — R110 Nausea: Secondary | ICD-10-CM | POA: Insufficient documentation

## 2013-05-31 DIAGNOSIS — G8929 Other chronic pain: Secondary | ICD-10-CM | POA: Insufficient documentation

## 2013-05-31 DIAGNOSIS — F3289 Other specified depressive episodes: Secondary | ICD-10-CM | POA: Insufficient documentation

## 2013-05-31 LAB — URINALYSIS, ROUTINE W REFLEX MICROSCOPIC
Glucose, UA: NEGATIVE mg/dL
Hgb urine dipstick: NEGATIVE
Leukocytes, UA: NEGATIVE
Specific Gravity, Urine: >1.03 — ABNORMAL HIGH (ref 1.005–1.030)
Urobilinogen, UA: 1 mg/dL (ref 0.0–1.0)

## 2013-05-31 LAB — URINE MICROSCOPIC-ADD ON

## 2013-05-31 LAB — BASIC METABOLIC PANEL
CO2: 27 mEq/L (ref 19–32)
Chloride: 100 mEq/L (ref 96–112)
Glucose, Bld: 96 mg/dL (ref 70–99)
Potassium: 4 mEq/L (ref 3.5–5.1)
Sodium: 137 mEq/L (ref 135–145)

## 2013-05-31 LAB — PREGNANCY, URINE: Preg Test, Ur: NEGATIVE

## 2013-05-31 LAB — CBC WITH DIFFERENTIAL/PLATELET
Lymphocytes Relative: 22 % (ref 12–46)
Lymphs Abs: 2.4 10*3/uL (ref 0.7–4.0)
MCV: 92.9 fL (ref 78.0–100.0)
Neutro Abs: 7.4 10*3/uL (ref 1.7–7.7)
Neutrophils Relative %: 68 % (ref 43–77)
Platelets: 327 10*3/uL (ref 150–400)
RBC: 4.2 MIL/uL (ref 3.87–5.11)
WBC: 10.9 10*3/uL — ABNORMAL HIGH (ref 4.0–10.5)

## 2013-05-31 MED ORDER — ONDANSETRON HCL 4 MG/2ML IJ SOLN
4.0000 mg | Freq: Once | INTRAMUSCULAR | Status: AC
  Administered 2013-05-31: 4 mg via INTRAVENOUS
  Filled 2013-05-31: qty 2

## 2013-05-31 MED ORDER — IOHEXOL 300 MG/ML  SOLN
100.0000 mL | Freq: Once | INTRAMUSCULAR | Status: AC | PRN
  Administered 2013-05-31: 100 mL via INTRAVENOUS

## 2013-05-31 MED ORDER — IOHEXOL 300 MG/ML  SOLN
50.0000 mL | Freq: Once | INTRAMUSCULAR | Status: AC | PRN
  Administered 2013-05-31: 50 mL via ORAL

## 2013-05-31 MED ORDER — SODIUM CHLORIDE 0.9 % IV BOLUS (SEPSIS)
1000.0000 mL | Freq: Once | INTRAVENOUS | Status: AC
  Administered 2013-05-31: 1000 mL via INTRAVENOUS

## 2013-05-31 MED ORDER — SULFAMETHOXAZOLE-TRIMETHOPRIM 800-160 MG PO TABS: 1.0000 | ORAL_TABLET | Freq: Two times a day (BID) | ORAL | Status: AC

## 2013-05-31 MED ORDER — MORPHINE SULFATE 4 MG/ML IJ SOLN
4.0000 mg | Freq: Once | INTRAMUSCULAR | Status: AC
  Administered 2013-05-31: 4 mg via INTRAVENOUS
  Filled 2013-05-31: qty 1

## 2013-05-31 MED ORDER — DEXTROSE 5 % IV SOLN
1.0000 g | Freq: Once | INTRAVENOUS | Status: AC
  Administered 2013-05-31: 1 g via INTRAVENOUS
  Filled 2013-05-31: qty 10

## 2013-05-31 MED ORDER — PHENAZOPYRIDINE HCL 95 MG PO TABS: 95.0000 mg | ORAL_TABLET | Freq: Three times a day (TID) | ORAL | Status: DC | PRN

## 2013-05-31 MED ORDER — FENTANYL CITRATE 0.05 MG/ML IJ SOLN
50.0000 ug | Freq: Once | INTRAMUSCULAR | Status: AC
  Administered 2013-05-31: 50 ug via INTRAVENOUS
  Filled 2013-05-31: qty 2

## 2013-05-31 NOTE — ED Notes (Signed)
Pt c/o generalized abdominal pain x2-3 days. Pt describes pain as sharp and intermittent. Pt denies any other symptoms at this time. Denies NVD.

## 2013-05-31 NOTE — ED Notes (Signed)
Abdominal pain, history of same

## 2013-05-31 NOTE — ED Provider Notes (Signed)
CSN: [id number]     Arrival date & time 05/31/13  1625 History   First MD Initiated Contact with Patient 05/31/13 1736     Chief Complaint  Patient presents with  . Abdominal Pain   (Consider location/radiation/quality/duration/timing/severity/associated sxs/prior Treatment) Patient is a 27 y.o. female presenting with abdominal pain.  Abdominal Pain  Pt with history of ovarian cyst reports 2-3 days of worsening diffuse abdominal pain has since localized to RLQ, associated with nausea and bloating but no vomiting. She has been constipated which is chronic for her. She also reports some dysuria and hesitancy. No fever, unsure if she is pregnant. She was previously using Nuvaring but took it out about a month ago and then had normal menstrual cycle after that.   Past Medical History  Diagnosis Date  . Asthma   . Headache(784.0)   . Depression   . Anxiety   . Narcotic abuse     history of    Past Surgical History  Procedure Laterality Date  . No past surgeries    . Cesarean section  12/16/2011    Procedure: CESAREAN SECTION;  Surgeon: Lazaro Arms, MD;  Location: [facility];  Service: Gynecology;  Laterality: N/A;  . Ovarian cyst drainage  2013   Family History  Problem Relation Age of Onset  . Heart attack Mother   . Diabetes Father   . Cancer Maternal Grandfather     lung   History  Substance Use Topics  . Smoking status: Current Every Day Smoker -- 0.50 packs/day    Types: Cigarettes  . Smokeless tobacco: Current User  . Alcohol Use: Yes     Comment: rarely   OB History   Grav Para Term Preterm Abortions TAB SAB Ect Mult Living   1 1 1       1      Review of Systems  Gastrointestinal: Positive for abdominal pain.   All other systems reviewed and are negative except as noted in HPI.   Allergies  Darvocet  Home Medications   Current Outpatient Rx  Name  Route  Sig  Dispense  Refill  . acetaminophen (TYLENOL) 500 MG tablet   Oral   Take 1,000 mg by mouth daily as  needed. For pain         . ALPRAZolam (XANAX) 0.5 MG tablet   Oral   Take 0.5 mg by mouth 3 (three) times daily as needed. For anxiety         . amphetamine-dextroamphetamine (ADDERALL XR) 20 MG 24 hr capsule   Oral   Take 20 mg by mouth every morning.         . Etonogestrel (NEXPLANON [place])   Subcutaneous   Inject into the skin.         Marland Kitchen etonogestrel-ethinyl estradiol (NUVARING) 0.12-0.015 MG/24HR vaginal ring      Insert vaginally and leave in place for 3 consecutive weeks, then remove for 1 week.   1 each   12   . Fluticasone-Salmeterol (ADVAIR) 100-50 MCG/DOSE AEPB   Inhalation   Inhale 1 puff into the lungs daily.          . naproxen sodium (ANAPROX DS) 550 MG tablet   Oral   Take 1 tablet (550 mg total) by mouth 3 (three) times daily with meals.   60 tablet   2   . oxyCODONE-acetaminophen (PERCOCET/ROXICET) 5-325 MG per tablet   Oral   Take 1-2 tablets by mouth every 3 (three) hours as needed (  moderate to severe pain (when tolerating fluids)).   30 tablet   0   . venlafaxine (EFFEXOR) 75 MG tablet   Oral   Take 75 mg by mouth every morning.         . Vilazodone HCl (VIIBRYD) 40 MG TABS   Oral   Take 1 tablet (40 mg total) by mouth daily.   30 tablet   11    BP 128/72  Pulse 113  Temp(Src) 98.2 F (36.8 C) (Oral)  Resp 24  Ht 5\' 6"  (1.676 m)  Wt 152 lb (68.947 kg)  BMI 24.55 kg/m2  SpO2 100%  LMP 04/19/2013 Physical Exam  Nursing note and vitals reviewed. Constitutional: She is oriented to person, place, and time. She appears well-developed and well-nourished.  HENT:  Head: Normocephalic and atraumatic.  Eyes: EOM are normal. Pupils are equal, round, and reactive to light.  Neck: Normal range of motion. Neck supple.  Cardiovascular: Normal rate, normal heart sounds and intact distal pulses.   Pulmonary/Chest: Effort normal and breath sounds normal.  Abdominal: Bowel sounds are normal. She exhibits no distension. There is tenderness  (diffuse). There is no rebound and no guarding.  Musculoskeletal: Normal range of motion. She exhibits no edema and no tenderness.  Neurological: She is alert and oriented to person, place, and time. She has normal strength. No cranial nerve deficit or sensory deficit.  Skin: Skin is warm and dry. No rash noted.  Psychiatric: She has a normal mood and affect.    ED Course  Procedures (including critical care time) Labs Review Labs Reviewed  CBC WITH DIFFERENTIAL - Abnormal; Notable for the following:    WBC 10.9 (*)    All other components within normal limits  URINALYSIS, ROUTINE W REFLEX MICROSCOPIC - Abnormal; Notable for the following:    APPearance HAZY (*)    Specific Gravity, Urine >1.030 (*)    Nitrite POSITIVE (*)    All other components within normal limits  URINE MICROSCOPIC-ADD ON - Abnormal; Notable for the following:    Squamous Epithelial / LPF MANY (*)    Bacteria, UA MANY (*)    All other components within normal limits  URINE CULTURE  BASIC METABOLIC PANEL  PREGNANCY, URINE   Imaging Review Ct Abdomen Pelvis W Contrast  05/31/2013   *RADIOLOGY REPORT*  Clinical Data: Right lower quadrant pain for 3 days  CT ABDOMEN AND PELVIS WITH CONTRAST  Technique:  Multidetector CT imaging of the abdomen and pelvis was performed following the standard protocol during bolus administration of intravenous contrast.  Contrast: 50mL OMNIPAQUE IOHEXOL 300 MG/ML  SOLN, OMNIPAQUE IOHEXOL 300 MG/ML  SOLN  Comparison: CT abdomen pelvis - 10/17/2012  Findings:  There is an approximately 1.9 x 1.5 cm peripherally enhancing right- sided presumably a hemorrhagic ovarian cyst.  This finding is associated with a minimal amount of high-density fluid layering within the dependent portion of the lower pelvis compatible with blood (image 71, series 2). A small amount of blood is also noted tracking along the right pericolic gutter extending to the caudal into the right lobe of the liver (image  41, series 2).  Normal hepatic contour. There is an ill-defined sub centimeter (approximately 8 mm) hypoattenuating lesion within the anterior segment right lobe of the liver (image 20, series 2) which is too small to fully characterize the favored to represent a minimally complex hepatic cyst versus a hepatic hemangioma.  Normal appearance of the gallbladder.  No intra or extrahepatic biliary dilatation.  No ascites.  There is symmetric enhancement of the bilateral kidneys.  No definite renal stones.  No urinary obstruction.  No discrete renal lesions. There is a tiny amount of air within the urinary bladder, presumably a result of temporary Foley catheter insertion.  Normal appearance of the bilateral adrenal glands, pancreas and spleen.  Ingested enteric contrast extends to the level of the mid/distal small bowel.  Large colonic stool burden without evidence of obstruction.  The bowel is otherwise normal in course and caliber without wall thickening.  Normal appearance of the appendix.  No pneumoperitoneum, pneumatosis or portal venous gas.  Normal caliber abdominal aorta.  The major branch vessel the abdominal aorta appear patent on this non CTA examination.  No definite retroperitoneal, mesenteric, pelvic or inguinal lymphadenopathy.  Limited visualization of the lower thorax is negative for focal airspace opacity or pleural effusion.  Normal heart size.  No pericardial effusion.  No acute or aggressive osseous abnormalities.  Several bone islands are noted within the right hemi pelvis.  IMPRESSION: 1.  Overall findings most suggestive of ruptured presumably physiologic right-sided hemorrhagic ovarian cyst with small amount of blood within the pelvis and trace amount of blood tracking along the right pericolic gutter. 2.  Large colonic stool burden without evidence of obstruction. Normal appearance of the appendix.   Original Report Authenticated By: Tacey Ruiz, MD    MDM   1. UTI (urinary tract  infection)   2. Ovarian cyst     Labs and imaging reviewed, pt with UTI and ovarian cyst. Given rocephin in the ED. Last urine culture was pan-sensitive e. Coli. Will d/c with Bactrim, Pyridium, PCP followup.     Charles B. Bernette Mayers, MD 05/31/13 2045

## 2013-06-03 LAB — URINE CULTURE

## 2013-06-04 ENCOUNTER — Telehealth (HOSPITAL_COMMUNITY): Payer: Self-pay | Admitting: Emergency Medicine

## 2013-06-04 NOTE — ED Notes (Signed)
Post ED Visit - Positive Culture Follow-up  Culture report reviewed by antimicrobial stewardship pharmacist: []  Wes Dulaney, Pharm.D., BCPS []  Celedonio Miyamoto, Pharm.D., BCPS []  Georgina Pillion, Pharm.D., BCPS []  [place], [address].D., BCPS, AAHIVP []  Estella Husk, Pharm.D., BCPS, AAHIVP [x]  Talbert Cage, Pharm.D., BCPS  Positive urine culture Treated with Sulfa-Trimeth, organism sensitive to the same and no further patient follow-up is required at this time.  Kylie A Holland 06/04/2013, 4:51 PM

## 2013-06-21 ENCOUNTER — Ambulatory Visit: Payer: Medicaid Other | Admitting: Obstetrics & Gynecology

## 2013-06-21 ENCOUNTER — Encounter: Payer: Self-pay | Admitting: *Deleted

## 2013-07-17 ENCOUNTER — Emergency Department (HOSPITAL_COMMUNITY)
Admission: EM | Admit: 2013-07-17 | Discharge: 2013-07-17 | Disposition: A | Discharge status: Home / Self Care | Payer: Medicaid Other | Attending: Emergency Medicine | Admitting: Emergency Medicine

## 2013-07-17 ENCOUNTER — Encounter (HOSPITAL_COMMUNITY): Payer: Self-pay | Admitting: Emergency Medicine

## 2013-07-17 DIAGNOSIS — F3289 Other specified depressive episodes: Secondary | ICD-10-CM | POA: Insufficient documentation

## 2013-07-17 DIAGNOSIS — Z79899 Other long term (current) drug therapy: Secondary | ICD-10-CM | POA: Insufficient documentation

## 2013-07-17 DIAGNOSIS — F329 Major depressive disorder, single episode, unspecified: Secondary | ICD-10-CM | POA: Insufficient documentation

## 2013-07-17 DIAGNOSIS — F411 Generalized anxiety disorder: Secondary | ICD-10-CM | POA: Insufficient documentation

## 2013-07-17 DIAGNOSIS — K089 Disorder of teeth and supporting structures, unspecified: Secondary | ICD-10-CM | POA: Insufficient documentation

## 2013-07-17 DIAGNOSIS — K0889 Other specified disorders of teeth and supporting structures: Secondary | ICD-10-CM

## 2013-07-17 DIAGNOSIS — IMO0002 Reserved for concepts with insufficient information to code with codable children: Secondary | ICD-10-CM | POA: Insufficient documentation

## 2013-07-17 DIAGNOSIS — J45909 Unspecified asthma, uncomplicated: Secondary | ICD-10-CM | POA: Insufficient documentation

## 2013-07-17 DIAGNOSIS — F17200 Nicotine dependence, unspecified, uncomplicated: Secondary | ICD-10-CM | POA: Insufficient documentation

## 2013-07-17 MED ORDER — PENICILLIN V POTASSIUM 250 MG PO TABS
500.0000 mg | ORAL_TABLET | Freq: Once | ORAL | Status: AC
  Administered 2013-07-17: 500 mg via ORAL
  Filled 2013-07-17: qty 2

## 2013-07-17 MED ORDER — PENICILLIN V POTASSIUM 500 MG PO TABS: 500.0000 mg | ORAL_TABLET | Freq: Four times a day (QID) | ORAL | Status: DC

## 2013-07-17 MED ORDER — TRAMADOL HCL 50 MG PO TABS
50.0000 mg | ORAL_TABLET | Freq: Once | ORAL | Status: AC
  Administered 2013-07-17: 50 mg via ORAL
  Filled 2013-07-17: qty 1

## 2013-07-17 MED ORDER — TRAMADOL HCL 50 MG PO TABS: 50.0000 mg | ORAL_TABLET | Freq: Four times a day (QID) | ORAL | Status: DC | PRN

## 2013-07-17 NOTE — ED Notes (Signed)
The pt has had a toothache for 18 months getting worse.  lmp one week ago

## 2013-07-17 NOTE — ED Provider Notes (Signed)
Medical screening examination/treatment/procedure(s) were performed by non-physician practitioner and as supervising physician I was immediately available for consultation/collaboration.   Brandt Loosen, MD 07/17/13 [phone]

## 2013-07-17 NOTE — ED Provider Notes (Signed)
CSN: [id number]     Arrival date & time 07/17/13  2956 History   First MD Initiated Contact with Patient 07/17/13 0448     Chief Complaint  Patient presents with  . Dental Pain   (Consider location/radiation/quality/duration/timing/severity/associated sxs/prior Treatment) HPI Comments: The patient is a 27 year old otherwise healthy female who presents with dental pain that started gradually one month ago. The dental pain is severe, constant and progressively worsening. The pain is aching and located in left upper jaw. The pain does not radiate. Eating makes the pain worse. Nothing makes the pain better. The patient has not tried anything for pain. No associated symptoms. Patient denies headache, neck pain/stiffness, fever, NVD, edema, sore throat, throat swelling, wheezing, SOB, chest pain, abdominal pain.      Past Medical History  Diagnosis Date  . Asthma   . Headache(784.0)   . Depression   . Anxiety   . Narcotic abuse     history of    Past Surgical History  Procedure Laterality Date  . No past surgeries    . Cesarean section  12/16/2011    Procedure: CESAREAN SECTION;  Surgeon: Lazaro Arms, MD;  Location: [facility];  Service: Gynecology;  Laterality: N/A;  . Ovarian cyst drainage  2013   Family History  Problem Relation Age of Onset  . Heart attack Mother   . Diabetes Father   . Cancer Maternal Grandfather     lung   History  Substance Use Topics  . Smoking status: Current Every Day Smoker -- 0.50 packs/day    Types: Cigarettes  . Smokeless tobacco: Current User  . Alcohol Use: Yes     Comment: rarely   OB History   Grav Para Term Preterm Abortions TAB SAB Ect Mult Living   1 1 1       1      Review of Systems  HENT: Positive for dental problem.   All other systems reviewed and are negative.    Allergies  Darvocet  Home Medications   Current Outpatient Rx  Name  Route  Sig  Dispense  Refill  . acetaminophen (TYLENOL) 500 MG tablet   Oral   Take 1,000  mg by mouth daily as needed. For pain         . albuterol (PROAIR HFA) 108 (90 BASE) MCG/ACT inhaler   Inhalation   Inhale 2 puffs into the lungs every 6 (six) hours as needed for wheezing.         Marland Kitchen ALPRAZolam (XANAX) 1 MG tablet   Oral   Take 1 mg by mouth 3 (three) times daily as needed for sleep.         Marland Kitchen amphetamine-dextroamphetamine (ADDERALL XR) 20 MG 24 hr capsule   Oral   Take 20 mg by mouth every morning.         . Fluticasone-Salmeterol (ADVAIR) 100-50 MCG/DOSE AEPB   Inhalation   Inhale 1 puff into the lungs daily.          Marland Kitchen ibuprofen (ADVIL,MOTRIN) 800 MG tablet   Oral   Take 800 mg by mouth every 8 (eight) hours as needed for pain.          BP 101/67  Pulse 106  Temp(Src) 97.4 F (36.3 C) (Oral)  Resp 18  SpO2 100%  LMP 07/10/2013 Physical Exam  Nursing note and vitals reviewed. Constitutional: She is oriented to person, place, and time. She appears well-developed and well-nourished. No distress.  HENT:  Head: Normocephalic and atraumatic.  Mouth/Throat: Oropharynx is clear and moist. No oropharyngeal exudate.  Fair dentition. Cracked left upper molar that is mildly tender to percussion. No drainable abscess noted.   Eyes: Conjunctivae and EOM are normal. Pupils are equal, round, and reactive to light.  Neck: Normal range of motion. Neck supple.  Cardiovascular: Normal rate and regular rhythm.  Exam reveals no gallop and no friction rub.   No murmur heard. Pulmonary/Chest: Effort normal and breath sounds normal. She has no wheezes. She has no rales. She exhibits no tenderness.  Musculoskeletal: Normal range of motion.  Lymphadenopathy:    She has no cervical adenopathy.  Neurological: She is alert and oriented to person, place, and time. Coordination normal.  Speech is goal-oriented. Moves limbs without ataxia.   Skin: Skin is warm and dry.  Psychiatric: She has a normal mood and affect. Her behavior is normal.    ED Course  Procedures  (including critical care time) Labs Review Labs Reviewed - No data to display Imaging Review No results found.  EKG Interpretation   None       MDM   1. Pain, dental     6:14 AM Patient will have Tramadol and Penicillin VK for dental pain. Patient will have a resource guide and instructed to follow up with a dental clinic listed. Vitals stable and patient afebrile. Patient sleeping when I walked in the room. No further evaluation needed at this time.     Emilia Beck, [place] 07/17/13 [phone]

## 2013-08-10 ENCOUNTER — Other Ambulatory Visit: Payer: Self-pay | Admitting: *Deleted

## 2013-08-10 MED ORDER — ALBUTEROL SULFATE HFA 108 (90 BASE) MCG/ACT IN AERS: 2.0000 | INHALATION_SPRAY | Freq: Two times a day (BID) | RESPIRATORY_TRACT | Status: DC | PRN

## 2013-08-18 ENCOUNTER — Encounter: Payer: Self-pay | Admitting: *Deleted

## 2013-10-19 ENCOUNTER — Ambulatory Visit: Payer: Medicaid Other | Admitting: Advanced Practice Midwife

## 2013-12-14 ENCOUNTER — Emergency Department (HOSPITAL_COMMUNITY)
Admission: EM | Admit: 2013-12-14 | Discharge: 2013-12-14 | Disposition: A | Discharge status: Home / Self Care | Payer: Medicaid Other | Attending: Emergency Medicine | Admitting: Emergency Medicine

## 2013-12-14 ENCOUNTER — Encounter (HOSPITAL_COMMUNITY): Payer: Self-pay | Admitting: Emergency Medicine

## 2013-12-14 DIAGNOSIS — F909 Attention-deficit hyperactivity disorder, unspecified type: Secondary | ICD-10-CM | POA: Insufficient documentation

## 2013-12-14 DIAGNOSIS — J45909 Unspecified asthma, uncomplicated: Secondary | ICD-10-CM | POA: Insufficient documentation

## 2013-12-14 DIAGNOSIS — T23202A Burn of second degree of left hand, unspecified site, initial encounter: Secondary | ICD-10-CM

## 2013-12-14 DIAGNOSIS — F17200 Nicotine dependence, unspecified, uncomplicated: Secondary | ICD-10-CM | POA: Insufficient documentation

## 2013-12-14 DIAGNOSIS — Y929 Unspecified place or not applicable: Secondary | ICD-10-CM | POA: Insufficient documentation

## 2013-12-14 DIAGNOSIS — Y93G3 Activity, cooking and baking: Secondary | ICD-10-CM | POA: Insufficient documentation

## 2013-12-14 DIAGNOSIS — F3289 Other specified depressive episodes: Secondary | ICD-10-CM | POA: Insufficient documentation

## 2013-12-14 DIAGNOSIS — F329 Major depressive disorder, single episode, unspecified: Secondary | ICD-10-CM | POA: Insufficient documentation

## 2013-12-14 DIAGNOSIS — X19XXXA Contact with other heat and hot substances, initial encounter: Secondary | ICD-10-CM | POA: Insufficient documentation

## 2013-12-14 DIAGNOSIS — Z79899 Other long term (current) drug therapy: Secondary | ICD-10-CM | POA: Insufficient documentation

## 2013-12-14 DIAGNOSIS — T23249A Burn of second degree of unspecified multiple fingers (nail), including thumb, initial encounter: Secondary | ICD-10-CM | POA: Insufficient documentation

## 2013-12-14 MED ORDER — TRAMADOL HCL 50 MG PO TABS: 50.0000 mg | ORAL_TABLET | Freq: Four times a day (QID) | ORAL | Status: DC | PRN

## 2013-12-14 MED ORDER — SILVER SULFADIAZINE 1 % EX CREA: 1.0000 "application " | TOPICAL_CREAM | Freq: Every day | CUTANEOUS | Status: DC

## 2013-12-14 MED ORDER — SODIUM CHLORIDE 0.9 % IV BOLUS (SEPSIS)
1000.0000 mL | Freq: Once | INTRAVENOUS | Status: AC
Start: 2013-12-14 — End: 2013-12-14
  Administered 2013-12-14: 1000 mL via INTRAVENOUS

## 2013-12-14 MED ORDER — SILVER SULFADIAZINE 1 % EX CREA
TOPICAL_CREAM | Freq: Two times a day (BID) | CUTANEOUS | Status: DC
  Administered 2013-12-14: 16:00:00 via TOPICAL
  Filled 2013-12-14: qty 85

## 2013-12-14 MED ORDER — FENTANYL CITRATE 0.05 MG/ML IJ SOLN
25.0000 ug | Freq: Once | INTRAMUSCULAR | Status: AC
  Administered 2013-12-14: 25 ug via INTRAVENOUS
  Filled 2013-12-14: qty 2

## 2013-12-14 NOTE — ED Notes (Signed)
Pt was making mac and cheese in [place] and top came off burning left hand last 3 fingers on left hand pt has IV and was given 10 morphine in route

## 2013-12-14 NOTE — Discharge Instructions (Signed)
Call for a follow up appointment with a Family or Primary Care Provider.  Return if Symptoms worsen.   Take medication as prescribed.  Change your dressing twice a day and keep your hand dry and clean.   Emergency Department Resource Guide 1) Find a Doctor and Pay Out of Pocket Although you won't have to find out who is covered by your insurance plan, it is a good idea to ask around and get recommendations. You will then need to call the office and see if the doctor you have chosen will accept you as a new patient and what types of options they offer for patients who are self-pay. Some doctors offer discounts or will set up payment plans for their patients who do not have insurance, but you will need to ask so you aren't surprised when you get to your appointment.  2) Contact Your Local Health Department Not all health departments have doctors that can see patients for sick visits, but many do, so it is worth a call to see if yours does. If you don't know where your local health department is, you can check in your phone book. The CDC also has a tool to help you locate your state's health department, and many state websites also have listings of all of their local health departments.  3) Find a Walk-in Clinic If your illness is not likely to be very severe or complicated, you may want to try a walk in clinic. These are popping up all over the country in pharmacies, drugstores, and shopping centers. They're usually staffed by nurse practitioners or physician assistants that have been trained to treat common illnesses and complaints. They're usually fairly quick and inexpensive. However, if you have serious medical issues or chronic medical problems, these are probably not your best option.  No Primary Care Doctor: - Call Health Connect at  [phone] - they can help you locate a primary care doctor that  accepts your insurance, provides certain services, etc. - Physician Referral Service-  [phone]  Chronic Pain Problems: Organization         Address  Phone   Notes  [facility]  [phone]) [phone] Patients need to be referred by their primary care doctor.   Medication Assistance: Organization         Address  Phone   Notes  [facility]ication [facility] [address] [place], [place] [address] [phone] --Must be a resident of [facility] [place] -- Must have NO insurance coverage whatsoever (no Medicaid/ Medicare, etc.) -- The pt. MUST have a primary care doctor that directs their care regularly and follows them in the community   MedAssist  [phone]   [employer]  [phone]) [phone]    Agencies that provide inexpensive medical care: Organization         Address  Phone   Notes  [facility]  [phone]   [facility]    [phone]) [phone]   [facility] [address] [place], [place] [address] [phone]   [facility] [address], [place] [phone]   Planned Parenthood    [phone]   [facility]    [phone]   [facility]  [address], [place] Phone:  [phone], Fax:  [phone]) [phone] Hours of Operation:  9 am - 6 pm, M-F.  Also accepts Medicaid/Medicare and self-pay.  [facility] for [place] [address], [address], [place] Phone: [phone], Fax: [phone]. Hours of Operation:  8:30 am - 5:30 pm, M-F.  Also accepts Medicaid and self-pay.  [facility] [place] [address] [place] Phone: [phone]   [place], [place], [place] [phone], Ext. [phone] Mondays & Thursdays: 7-9 AM.  First 15 patients are seen on a first come, first serve basis.    [place] Providers:  Organization         Address  Phone   Notes  [facility] [address],  [place] [phone] Also accepts self-pay patients.  [facility] [id number] [place], [place]  [phone]   [place], [address], [place] [phone]   [facility] [address] [place] [phone]   Lucianne Lei [address], [place]   [phone] Only accepts [place] Access [place] patients after they have their name applied to their card.   Self-Pay (no insurance) in [facility]:  Organization         Address  Phone   Notes  Sickle Cell Patients, [facility] [place] [phone]   [facility] [place] [phone]   [facility] [place]  [place], [place], [place] [phone]   [facility]/Dr. Osei-Bonsu  [address] [place] [address] [place] or [address], [place] [phone] Phone number for both [place] and [place] locations is the same.  Urgent Medical and [facility] [address], [place] [phone]   [facility] [address] [place] or [address] [phone]   [facility] [address], [place] [phone], phone; [phone], fax Sees patients 1st and 3rd Saturday of every month.  Must not qualify for public or private insurance (i.e. Medicaid, Medicare, [place] Health Choice, Veterans' Benefits)  Household income should be no more than 200% of the poverty level The clinic cannot treat you if you are pregnant or think you are pregnant  Sexually transmitted diseases are not treated at the clinic.    Dental Care: Organization         Address  Phone  Notes  [facility] of [facility] [place] [phone] Accepts children up to age 81 who are enrolled in  [place] or [place]; pregnant women with a Medicaid card; and children who have applied for Medicaid or  Health Choice, but were declined, whose parents can pay a reduced fee at time of service.  [facility] of [facility]  [address], [place] [phone] Accepts children up to age 44 who are enrolled in [place] or [place]; pregnant women with a Medicaid card; and children who have applied for Medicaid or  Health Choice, but were declined, whose parents can pay a reduced fee at time of service.  [facility] PRO[facility]  [place] [phone] Patients are seen by appointment only. Walk-ins are not accepted. [place] will see patients 52 years of age and older. Monday - Tuesday (8am-5pm) Most Wednesdays (8:30-5pm) $30 per  visit, cash only  [facility] Adult [employer] PROGRAM  [address], [address] [phone] Patients are seen by appointment only. Walk-ins are not accepted. [place] will see patients 48 years of age and older. One Wednesday Evening (Monthly: Volunteer Based).  $30 per visit, cash only  [place]  [phone] for adults; Children under age 62, call Graduate Pediatric Dentistry at [phone]. Children aged 41-14, please call [phone] to request a pediatric application.  Dental services are provided in all areas of dental care including fillings, crowns and bridges, complete and partial dentures, implants, gum treatment, root canals, and extractions. Preventive care is also provided. Treatment is provided to both adults and children. Patients are selected via a lottery and there is often a waiting list.   [facility] [address] [place]  [phone] [email or web address]   [facility] [address] [place], [place] [phone], Ext. [phone] Second and Fourth Thursday of each month, opens at 6:30  AM; Clinic ends at 9 AM.  Patients are seen on a first-come first-served basis, and a limited number are seen during each clinic.   [facility]  [address] Hillard Danker [place], [place] [phone]   Eligibility Requirements You must have lived in [place], [place], or [place] counties for at least the last three months.   You cannot be eligible for state or federal sponsored [employer], including [employer], [place], or [employer].   You generally cannot be eligible for healthcare insurance through your employer.    How to apply: Eligibility screenings are held every Tuesday and Wednesday afternoon from 1:00 pm until 4:00 pm. You do not need an appointment for the interview!  [facility] [address], [place], [place]   [place]  [place] Department  [place]  [phone]    Behavioral Health Resources in the Community: Intensive Outpatient Programs Organization         Address  Phone  Notes  Ecru Dyer. [address] [place], [place] [phone]   [facility] Outpatient [address] [place], [place]   ADS: Alcohol & Drug Svcs [address] [place], [place]   [place] [address]  [place], [place] or [phone]   Substance Abuse Resources Organization         Address  Phone  Notes  Alcohol and Drug Services  [phone]   [place]  [phone]   The [place]   Chinita Pester  [phone]   Residential & Outpatient Substance Abuse Program  [phone]   Psychological Services Organization         Address  Phone  Notes  [facility] [place]  [place]  [phone]   [place] [address] [place] or  [phone]    Mobile Crisis Teams Organization         Address  Phone  Notes  [facility], Mobile Crisis Care Unit  [phone]   [facility]  [address] [place], [place]   [place] Levels [address], [place] [phone]    Self-Help/Support Groups Organization         Address  Phone  Notes  [facility] [place] - variety of support groups  [place] Call for more information  Narcotics Anonymous (NA), [facility] [address], [employer] [place]  2 meetings at this location   Special educational needs teacher         Address  Phone  Notes  ASAP Residential Treatment [place],    [place]  [phone]   [facility]  [address] [place] [id number], [place], [place]   [place], [place] [phone] Admissions: 8am-3pm M-F  Incentives Substance [place] [address]    [place], [place] [phone]   [facility] [address] [place], [place], [place]   [facility] [address],  [place], [place]   [facility] - Intensive Outpatient [address] Kristeen [address], [place], [place]   [facility] ([place] [address].,  [place], [place] [phone] or [phone]   [facility] Services (RTS) [address] [place], [place] Accepts Medicaid  [facility] [place] [address],  [place] [phone] Substance Abuse/Addiction Treatment   [facility] Organization         Address  Phone  Notes  [facility]  [phone]   Domenic Schwab, PhD [address] Arlis Porta [place], [place]   [phone] or [phone]   [place] and [place], [place] [phone]   [facility] [address],  [place], [place] [phone] Insurance/Medicaid/sponsorship through [facility] and Families [address], [address] [place]                                    [place], [place] [phone] [place] [address] [place], [place] [phone]    Dr. Adele Schilder  [phone]   [facility] Dept. 1) [address], [place] 2) [place] 3)  [place] [address], [place] [phone]  [phone]   [place] [phone] or [phone] (After Hours)

## 2013-12-14 NOTE — ED Notes (Signed)
DSD applied to left hand.
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
## 2013-12-15 ENCOUNTER — Emergency Department (HOSPITAL_COMMUNITY)
Admission: EM | Admit: 2013-12-15 | Discharge: 2013-12-15 | Disposition: A | Discharge status: Home / Self Care | Payer: Medicaid Other | Attending: Emergency Medicine | Admitting: Emergency Medicine

## 2013-12-15 ENCOUNTER — Encounter (HOSPITAL_COMMUNITY): Payer: Self-pay | Admitting: Emergency Medicine

## 2013-12-15 DIAGNOSIS — Y9389 Activity, other specified: Secondary | ICD-10-CM | POA: Insufficient documentation

## 2013-12-15 DIAGNOSIS — T23249A Burn of second degree of unspecified multiple fingers (nail), including thumb, initial encounter: Secondary | ICD-10-CM

## 2013-12-15 DIAGNOSIS — F909 Attention-deficit hyperactivity disorder, unspecified type: Secondary | ICD-10-CM | POA: Insufficient documentation

## 2013-12-15 DIAGNOSIS — F411 Generalized anxiety disorder: Secondary | ICD-10-CM | POA: Insufficient documentation

## 2013-12-15 DIAGNOSIS — X131XXA Other contact with steam and other hot vapors, initial encounter: Secondary | ICD-10-CM

## 2013-12-15 DIAGNOSIS — T23219A Burn of second degree of unspecified thumb (nail), initial encounter: Secondary | ICD-10-CM | POA: Insufficient documentation

## 2013-12-15 DIAGNOSIS — X12XXXA Contact with other hot fluids, initial encounter: Secondary | ICD-10-CM | POA: Insufficient documentation

## 2013-12-15 DIAGNOSIS — F3289 Other specified depressive episodes: Secondary | ICD-10-CM | POA: Insufficient documentation

## 2013-12-15 DIAGNOSIS — Y92009 Unspecified place in unspecified non-institutional (private) residence as the place of occurrence of the external cause: Secondary | ICD-10-CM | POA: Insufficient documentation

## 2013-12-15 DIAGNOSIS — J45909 Unspecified asthma, uncomplicated: Secondary | ICD-10-CM | POA: Insufficient documentation

## 2013-12-15 DIAGNOSIS — F329 Major depressive disorder, single episode, unspecified: Secondary | ICD-10-CM | POA: Insufficient documentation

## 2013-12-15 DIAGNOSIS — Z79899 Other long term (current) drug therapy: Secondary | ICD-10-CM | POA: Insufficient documentation

## 2013-12-15 DIAGNOSIS — F17200 Nicotine dependence, unspecified, uncomplicated: Secondary | ICD-10-CM | POA: Insufficient documentation

## 2013-12-15 DIAGNOSIS — T23239A Burn of second degree of unspecified multiple fingers (nail), not including thumb, initial encounter: Secondary | ICD-10-CM | POA: Insufficient documentation

## 2013-12-15 MED ORDER — ONDANSETRON HCL 4 MG/2ML IJ SOLN
4.0000 mg | Freq: Once | INTRAMUSCULAR | Status: AC
  Administered 2013-12-15: 4 mg via INTRAVENOUS
  Filled 2013-12-15: qty 2

## 2013-12-15 MED ORDER — MORPHINE SULFATE 4 MG/ML IJ SOLN
4.0000 mg | Freq: Once | INTRAMUSCULAR | Status: AC
  Administered 2013-12-15: 4 mg via INTRAVENOUS
  Filled 2013-12-15: qty 1

## 2013-12-15 MED ORDER — AMOXICILLIN-POT CLAVULANATE 875-125 MG PO TABS: 1.0000 | ORAL_TABLET | Freq: Two times a day (BID) | ORAL | Status: DC

## 2013-12-15 MED ORDER — KETOROLAC TROMETHAMINE 30 MG/ML IJ SOLN
30.0000 mg | Freq: Once | INTRAMUSCULAR | Status: AC
  Administered 2013-12-15: 30 mg via INTRAVENOUS
  Filled 2013-12-15: qty 1

## 2013-12-15 MED ORDER — AMOXICILLIN-POT CLAVULANATE 875-125 MG PO TABS
1.0000 | ORAL_TABLET | Freq: Once | ORAL | Status: AC
  Administered 2013-12-15: 1 via ORAL
  Filled 2013-12-15: qty 1

## 2013-12-15 MED ORDER — SILVER SULFADIAZINE 1 % EX CREA
TOPICAL_CREAM | Freq: Once | CUTANEOUS | Status: DC
  Filled 2013-12-15: qty 50

## 2013-12-15 MED ORDER — OXYCODONE-ACETAMINOPHEN 5-325 MG PO TABS: 1.0000 | ORAL_TABLET | ORAL | Status: DC | PRN

## 2013-12-15 MED ORDER — OXYCODONE-ACETAMINOPHEN 5-325 MG PO TABS
1.0000 | ORAL_TABLET | Freq: Once | ORAL | Status: AC
  Administered 2013-12-15: 1 via ORAL
  Filled 2013-12-15: qty 1

## 2013-12-15 NOTE — ED Provider Notes (Signed)
Medical screening examination/treatment/procedure(s) were conducted as a shared visit with non-physician practitioner(s) and myself.  I personally evaluated the patient during the encounter.  See my additional note  EKG Interpretation None        Glynn OctaveStephen Arhaan Chesnut, MD 12/15/13 1932
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
## 2013-12-15 NOTE — Discharge Instructions (Signed)
Burn Care Burns hurt your skin. When your skin is hurt, it is easier to get an infection. Follow your doctor's directions to help prevent an infection. HOME CARE  Wash your hands well before you change your bandage.  Change your bandage as often as told by your doctor.  Remove the old bandage. If the bandage sticks, soak it off with cool, clean water.  Gently clean the burn with mild soap and water.  Pat the burn dry with a clean, dry cloth.  Put a thin layer of medicated cream on the burn.  Put a clean bandage on as told by your doctor.  Keep the bandage clean and dry.  Raise (elevate) the burn for the first 24 hours. After that, follow your doctor's directions.  Only take medicine as told by your doctor. GET HELP RIGHT AWAY IF:   You have too much pain.  The skin near the burn is red, tender, puffy (swollen), or has red streaks.  The burn area has yellowish white fluid (pus) or a bad smell coming from it.  You have a fever. MAKE SURE YOU:   Understand these instructions.  Will watch your condition.  Will get help right away if you are not doing well or get worse. Document Released: 06/30/2008 Document Revised: 12/14/2011 Document Reviewed: 02/11/2011 [facility] Patient Information 2014 [employer] [place].  Second-Degree Burn A second-degree burn affects the 2 outer layers of skin. The outer layer (epidermis) and the layer underneath it (dermis) are both burned. Another name for this type of burn is a partial thickness burn. A second-degree burn may be called minor or major. This depends on the size of the burn. It also depends on what parts of the skin are burned. Minor burns may be treated with first aid. Major burns are a medical emergency. A second-degree burn is worse than a first-degree burn, but not as bad as a third-degree burn. A first-degree burn affects only the epidermis. A third-degree burn goes through all the layers of skin. A second-degree burn usually heals in  3 to 4 weeks. A minor second-degree burn usually does not leave a scar.Deeper second-degree burns may lead to scarring of the skin or contractures over joints.Contractures are scars that form over joints and may lead to reduced mobility at those joints. CAUSES  Heat (thermal) injury. This happens when skin comes in contact with something very hot. It could be a flame, a hot object, hot liquid, or steam. Most second-degree burns are thermal injuries.  Radiation. Sunlight is one type of radiation that can burn the skin. Another type of radiation is used to heat food. Radiation is also used to treat some diseases, such as cancer. All types of radiation can burn the skin. Sunlight usually causes a first-degree burn. Radiation used for heating food or treating a disease can cause a second-degree burn.  Electricity. Electrical burns can cause more damage under the skin than on the surface. They should always be treated as major burns.  Chemicals. Many chemicals can burn the skin. The burn should be flushed with cool water and checked by an emergency caregiver. SYMPTOMS Symptoms of second-degree burns include:  Severe pain.  Extreme tenderness.  Deep redness.  Blistered skin.  Skin that has changed color.It might look blotchy, wet, or shiny.  Swelling. TREATMENT Some second-degree burns may need to be treated in a hospital. These include major burns, electrical burns, and chemical burns. Many other second-degree burns can be treated with regular first aid, such as:  Cooling the burn. Use cool, germ-free (sterile) salt water. Place the burned area of skin into a tub of water, or cover the burned area with clean, wet towels.  Taking pain medicine.  Removing the dead skin from broken blisters. A trained caregiver may do this. Do not pop blisters.  Gently washing your skin with mild soap.  Covering the burned area with a cream.Silver sulfadiazine is a cream for burns. An antibiotic cream,  such as bacitracin, may also be used to fight infection. Do not use other ointments or creams unless your caregiver says it is okay.  Protecting the burn with a sterile, non-sticky bandage.  Bandaging fingers and toes separately. This keeps them from sticking together.  Taking an antibiotic. This can help prevent infection.  Getting a tetanus shot. HOME CARE INSTRUCTIONS Medication  Take any medicine prescribed by your caregiver. Follow the directions carefully.  Ask your caregiver if you can take over-the-counter medicine to relieve pain and swelling. Do not give aspirin to children.  Make sure your caregiver knows about all other medicines you take.This includes over-the-counter medicines. Burn care  You will need to change the bandage on your burn. You may need to do this 2 or 3 times each day.  Gently clean the burned area.  Put ointment on it.  Cover the burn with a sterile bandage.  For some deeper burns or burns that cover a large area, compression garments may be prescribed. These garments can help minimize scarring and protect your mobility.  Do not put butter or oil on your skin. Use only the cream prescribed by your caregiver.  Do not put ice on your burn.  Do not break blisters on your skin.  Keep the bandaged area dry. You might need to take a sponge bath for awhile.Ask your caregiver when you can take a shower or a tub bath again.  Do not scratch an itchy burn. Your caregiver may give you medicine to relieve very bad itching.  Infection is a big danger after a second-degree burn. Tell your caregiver right away if you have signs of infection, such as:  Redness or changing color in the burned area.  Fluid leaking from the burn.  Swelling in the burn area.  A bad smell coming from the wound. Follow-up  Keep all follow-up appointments.This is important. This is how your caregiver can tell if your treatment is working.  Protect your burn from  sunlight.Use sunscreen whenever you go outside.Burned areas may be sensitive to the sun for up to 1 year. Exposure to the sun may also cause permanent darkening of scars. SEEK MEDICAL CARE IF:  You have any questions about medicines.  You have any questions about your treatment.  You wonder if it is okay to do a particular activity.  You develop a fever of more than 100.5 F (38.1 C). SEEK IMMEDIATE MEDICAL CARE IF:  You think your burn might be infected. It may change color, become red, leak fluid, swell, or smell bad.  You develop a fever of more than 102 F (38.9 C). Document Released: 02/23/2011 Document Revised: 12/14/2011 Document Reviewed: 02/23/2011 [facility] Patient Information 2014 [place], [place].
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
## 2013-12-18 ENCOUNTER — Ambulatory Visit (HOSPITAL_COMMUNITY)
Admission: RE | Admit: 2013-12-18 | Discharge: 2013-12-18 | Disposition: A | Discharge status: Home / Self Care | Payer: Medicaid Other | Source: Ambulatory Visit | Attending: Orthopedic Surgery | Admitting: Orthopedic Surgery

## 2013-12-18 DIAGNOSIS — X131XXA Other contact with steam and other hot vapors, initial encounter: Secondary | ICD-10-CM

## 2013-12-18 DIAGNOSIS — Y92009 Unspecified place in unspecified non-institutional (private) residence as the place of occurrence of the external cause: Secondary | ICD-10-CM | POA: Insufficient documentation

## 2013-12-18 DIAGNOSIS — X12XXXA Contact with other hot fluids, initial encounter: Secondary | ICD-10-CM | POA: Insufficient documentation

## 2013-12-18 DIAGNOSIS — T23299A Burn of second degree of multiple sites of unspecified wrist and hand, initial encounter: Secondary | ICD-10-CM | POA: Insufficient documentation

## 2013-12-18 DIAGNOSIS — IMO0001 Reserved for inherently not codable concepts without codable children: Secondary | ICD-10-CM | POA: Insufficient documentation

## 2013-12-18 DIAGNOSIS — T23202A Burn of second degree of left hand, unspecified site, initial encounter: Secondary | ICD-10-CM | POA: Insufficient documentation
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
## 2013-12-22 ENCOUNTER — Ambulatory Visit (HOSPITAL_COMMUNITY): Payer: Medicaid Other | Admitting: Physical Therapy

## 2014-01-25 ENCOUNTER — Emergency Department (HOSPITAL_COMMUNITY)
Admission: EM | Admit: 2014-01-25 | Discharge: 2014-01-26 | Disposition: A | Discharge status: Other Acute Inpatient Hospital | Payer: MEDICAID | Attending: Emergency Medicine | Admitting: Emergency Medicine

## 2014-01-25 ENCOUNTER — Encounter (HOSPITAL_COMMUNITY): Payer: Self-pay | Admitting: Emergency Medicine

## 2014-01-25 DIAGNOSIS — F909 Attention-deficit hyperactivity disorder, unspecified type: Secondary | ICD-10-CM | POA: Diagnosis not present

## 2014-01-25 DIAGNOSIS — R6889 Other general symptoms and signs: Secondary | ICD-10-CM | POA: Insufficient documentation

## 2014-01-25 DIAGNOSIS — Z792 Long term (current) use of antibiotics: Secondary | ICD-10-CM | POA: Insufficient documentation

## 2014-01-25 DIAGNOSIS — F329 Major depressive disorder, single episode, unspecified: Secondary | ICD-10-CM | POA: Diagnosis not present

## 2014-01-25 DIAGNOSIS — F1010 Alcohol abuse, uncomplicated: Secondary | ICD-10-CM | POA: Diagnosis present

## 2014-01-25 DIAGNOSIS — IMO0002 Reserved for concepts with insufficient information to code with codable children: Secondary | ICD-10-CM | POA: Diagnosis not present

## 2014-01-25 DIAGNOSIS — Z79899 Other long term (current) drug therapy: Secondary | ICD-10-CM | POA: Diagnosis not present

## 2014-01-25 DIAGNOSIS — F3289 Other specified depressive episodes: Secondary | ICD-10-CM | POA: Insufficient documentation

## 2014-01-25 DIAGNOSIS — J45909 Unspecified asthma, uncomplicated: Secondary | ICD-10-CM | POA: Diagnosis not present

## 2014-01-25 DIAGNOSIS — F411 Generalized anxiety disorder: Secondary | ICD-10-CM | POA: Insufficient documentation

## 2014-01-25 DIAGNOSIS — R45851 Suicidal ideations: Secondary | ICD-10-CM | POA: Insufficient documentation

## 2014-01-25 DIAGNOSIS — F17200 Nicotine dependence, unspecified, uncomplicated: Secondary | ICD-10-CM | POA: Diagnosis not present

## 2014-01-25 DIAGNOSIS — Z3202 Encounter for pregnancy test, result negative: Secondary | ICD-10-CM | POA: Insufficient documentation
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
## 2014-01-25 NOTE — ED Notes (Signed)
Pt reports she wants help to get off of alcohol, states she drinks daily, last drink approx 30 mins ago.  Pt denies other substance abuse.  Pt denies SI/HI

## 2014-01-26 ENCOUNTER — Inpatient Hospital Stay (HOSPITAL_COMMUNITY)
Admission: EM | Admit: 2014-01-26 | Discharge: 2014-01-29 | DRG: 897 | Disposition: A | Discharge status: Home / Self Care | Payer: MEDICAID | Source: Intra-hospital | Attending: Psychiatry | Admitting: Psychiatry

## 2014-01-26 ENCOUNTER — Encounter (HOSPITAL_COMMUNITY): Payer: Self-pay | Admitting: *Deleted

## 2014-01-26 DIAGNOSIS — F909 Attention-deficit hyperactivity disorder, unspecified type: Secondary | ICD-10-CM | POA: Diagnosis present

## 2014-01-26 DIAGNOSIS — F1120 Opioid dependence, uncomplicated: Secondary | ICD-10-CM | POA: Diagnosis present

## 2014-01-26 DIAGNOSIS — Z8249 Family history of ischemic heart disease and other diseases of the circulatory system: Secondary | ICD-10-CM

## 2014-01-26 DIAGNOSIS — F17200 Nicotine dependence, unspecified, uncomplicated: Secondary | ICD-10-CM | POA: Diagnosis present

## 2014-01-26 DIAGNOSIS — F339 Major depressive disorder, recurrent, unspecified: Secondary | ICD-10-CM

## 2014-01-26 DIAGNOSIS — F411 Generalized anxiety disorder: Secondary | ICD-10-CM | POA: Diagnosis present

## 2014-01-26 DIAGNOSIS — F1020 Alcohol dependence, uncomplicated: Principal | ICD-10-CM | POA: Diagnosis present

## 2014-01-26 DIAGNOSIS — Z5987 Material hardship due to limited financial resources, not elsewhere classified: Secondary | ICD-10-CM

## 2014-01-26 DIAGNOSIS — Z598 Other problems related to housing and economic circumstances: Secondary | ICD-10-CM

## 2014-01-26 DIAGNOSIS — G4700 Insomnia, unspecified: Secondary | ICD-10-CM | POA: Diagnosis present

## 2014-01-26 DIAGNOSIS — Z833 Family history of diabetes mellitus: Secondary | ICD-10-CM

## 2014-01-26 DIAGNOSIS — F1994 Other psychoactive substance use, unspecified with psychoactive substance-induced mood disorder: Secondary | ICD-10-CM | POA: Diagnosis present

## 2014-01-26 DIAGNOSIS — J45909 Unspecified asthma, uncomplicated: Secondary | ICD-10-CM | POA: Diagnosis present

## 2014-01-26 LAB — RAPID URINE DRUG SCREEN, HOSP PERFORMED
AMPHETAMINES: NOT DETECTED
Barbiturates: NOT DETECTED
Benzodiazepines: POSITIVE — AB
COCAINE: NOT DETECTED
Opiates: NOT DETECTED
TETRAHYDROCANNABINOL: NOT DETECTED

## 2014-01-26 LAB — BASIC METABOLIC PANEL
BUN: 11 mg/dL (ref 6–23)
CO2: 30 mEq/L (ref 19–32)
Calcium: 9.6 mg/dL (ref 8.4–10.5)
Chloride: 107 mEq/L (ref 96–112)
Creatinine, Ser: 0.73 mg/dL (ref 0.50–1.10)
GFR calc Af Amer: >90 mL/min (ref >90)
GFR calc non Af Amer: >90 mL/min (ref >90)
Glucose, Bld: 107 mg/dL — ABNORMAL HIGH (ref 70–99)
Potassium: 4.7 mEq/L (ref 3.7–5.3)
Sodium: 146 mEq/L (ref 137–147)

## 2014-01-26 LAB — CBC WITH DIFFERENTIAL/PLATELET
Basophils Absolute: 0.1 10*3/uL (ref 0.0–0.1)
Basophils Relative: 1 % (ref 0–1)
Eosinophils Absolute: 0.3 10*3/uL (ref 0.0–0.7)
Eosinophils Relative: 2 % (ref 0–5)
HCT: 43.3 % (ref 36.0–46.0)
Hemoglobin: 14.4 g/dL (ref 12.0–15.0)
Lymphocytes Relative: 21 % (ref 12–46)
Lymphs Abs: 2.4 10*3/uL (ref 0.7–4.0)
MCH: 32 pg (ref 26.0–34.0)
MCHC: 33.3 g/dL (ref 30.0–36.0)
MCV: 96.2 fL (ref 78.0–100.0)
Monocytes Absolute: 0.6 10*3/uL (ref 0.1–1.0)
Monocytes Relative: 5 % (ref 3–12)
Neutro Abs: 8.1 10*3/uL — ABNORMAL HIGH (ref 1.7–7.7)
Neutrophils Relative %: 71 % (ref 43–77)
Platelets: 457 10*3/uL — ABNORMAL HIGH (ref 150–400)
RBC: 4.5 MIL/uL (ref 3.87–5.11)
RDW: 13 % (ref 11.5–15.5)
WBC: 11.4 10*3/uL — ABNORMAL HIGH (ref 4.0–10.5)

## 2014-01-26 LAB — ACETAMINOPHEN LEVEL: Acetaminophen (Tylenol), Serum: <15 ug/mL (ref 10–30)

## 2014-01-26 LAB — PREGNANCY, URINE: Preg Test, Ur: NEGATIVE

## 2014-01-26 LAB — ETHANOL: Alcohol, Ethyl (B): 267 mg/dL — ABNORMAL HIGH (ref 0–11)

## 2014-01-26 MED ORDER — ALPRAZOLAM 0.5 MG PO TABS
1.0000 mg | ORAL_TABLET | Freq: Three times a day (TID) | ORAL | Status: DC | PRN
Start: 2014-01-26 — End: 2014-01-26

## 2014-01-26 MED ORDER — ZOLPIDEM TARTRATE 5 MG PO TABS
5.0000 mg | ORAL_TABLET | Freq: Every evening | ORAL | Status: DC | PRN
  Administered 2014-01-26: 5 mg via ORAL
  Filled 2014-01-26: qty 1

## 2014-01-26 MED ORDER — DICYCLOMINE HCL 20 MG PO TABS: 20.0000 mg | ORAL_TABLET | Freq: Four times a day (QID) | ORAL | Status: DC | PRN

## 2014-01-26 MED ORDER — MOMETASONE FURO-FORMOTEROL FUM 100-5 MCG/ACT IN AERO
2.0000 | INHALATION_SPRAY | Freq: Two times a day (BID) | RESPIRATORY_TRACT | Status: DC
  Filled 2014-01-26: qty 8.8

## 2014-01-26 MED ORDER — VENLAFAXINE HCL ER 75 MG PO CP24
75.0000 mg | ORAL_CAPSULE | Freq: Two times a day (BID) | ORAL | Status: DC
  Filled 2014-01-26 (×2): qty 1

## 2014-01-26 MED ORDER — CHLORDIAZEPOXIDE HCL 25 MG PO CAPS
25.0000 mg | ORAL_CAPSULE | Freq: Three times a day (TID) | ORAL | Status: AC
  Administered 2014-01-27 (×3): 25 mg via ORAL
  Filled 2014-01-26 (×3): qty 1

## 2014-01-26 MED ORDER — MOMETASONE FURO-FORMOTEROL FUM 100-5 MCG/ACT IN AERO
INHALATION_SPRAY | RESPIRATORY_TRACT | Status: AC
  Filled 2014-01-26: qty 8.8

## 2014-01-26 MED ORDER — VENLAFAXINE HCL ER 75 MG PO CP24: 75.0000 mg | ORAL_CAPSULE | Freq: Two times a day (BID) | ORAL | Status: DC

## 2014-01-26 MED ORDER — OXYCODONE-ACETAMINOPHEN 5-325 MG PO TABS: 1.0000 | ORAL_TABLET | Freq: Three times a day (TID) | ORAL | Status: DC | PRN

## 2014-01-26 MED ORDER — IBUPROFEN 400 MG PO TABS
600.0000 mg | ORAL_TABLET | Freq: Three times a day (TID) | ORAL | Status: DC | PRN
Start: 2014-01-26 — End: 2014-01-26

## 2014-01-26 MED ORDER — TRAZODONE HCL 50 MG PO TABS: 50.0000 mg | ORAL_TABLET | Freq: Every evening | ORAL | Status: DC | PRN

## 2014-01-26 MED ORDER — OXYCODONE-ACETAMINOPHEN 5-325 MG PO TABS
1.0000 | ORAL_TABLET | Freq: Once | ORAL | Status: AC
  Administered 2014-01-26: 1 via ORAL
  Filled 2014-01-26: qty 1

## 2014-01-26 MED ORDER — CLONIDINE HCL 0.1 MG PO TABS
0.1000 mg | ORAL_TABLET | Freq: Every day | ORAL | Status: DC
  Filled 2014-01-26 (×2): qty 1

## 2014-01-26 MED ORDER — MOMETASONE FURO-FORMOTEROL FUM 100-5 MCG/ACT IN AERO
2.0000 | INHALATION_SPRAY | Freq: Two times a day (BID) | RESPIRATORY_TRACT | Status: DC
  Administered 2014-01-26 – 2014-01-29 (×4): 2 via RESPIRATORY_TRACT
  Filled 2014-01-26: qty 8.8

## 2014-01-26 MED ORDER — CHLORDIAZEPOXIDE HCL 25 MG PO CAPS
25.0000 mg | ORAL_CAPSULE | Freq: Four times a day (QID) | ORAL | Status: AC | PRN
  Administered 2014-01-28: 25 mg via ORAL
  Filled 2014-01-26: qty 1

## 2014-01-26 MED ORDER — TRAMADOL HCL 50 MG PO TABS: 50.0000 mg | ORAL_TABLET | Freq: Four times a day (QID) | ORAL | Status: DC | PRN

## 2014-01-26 MED ORDER — ONDANSETRON 4 MG PO TBDP: 4.0000 mg | ORAL_TABLET | Freq: Four times a day (QID) | ORAL | Status: DC | PRN

## 2014-01-26 MED ORDER — ADULT MULTIVITAMIN W/MINERALS CH
1.0000 | ORAL_TABLET | Freq: Every day | ORAL | Status: DC
  Administered 2014-01-26 – 2014-01-29 (×4): 1 via ORAL
  Filled 2014-01-26 (×6): qty 1

## 2014-01-26 MED ORDER — THIAMINE HCL 100 MG/ML IJ SOLN: 100.0000 mg | Freq: Once | INTRAMUSCULAR | Status: DC

## 2014-01-26 MED ORDER — CLONIDINE HCL 0.1 MG PO TABS
0.1000 mg | ORAL_TABLET | ORAL | Status: DC
  Administered 2014-01-27 – 2014-01-28 (×3): 0.1 mg via ORAL
  Filled 2014-01-26 (×4): qty 1

## 2014-01-26 MED ORDER — NICOTINE 14 MG/24HR TD PT24
14.0000 mg | MEDICATED_PATCH | Freq: Every day | TRANSDERMAL | Status: DC
  Administered 2014-01-26: 14 mg via TRANSDERMAL
  Filled 2014-01-26: qty 1

## 2014-01-26 MED ORDER — NAPROXEN 500 MG PO TABS
500.0000 mg | ORAL_TABLET | Freq: Two times a day (BID) | ORAL | Status: DC | PRN
  Administered 2014-01-26 – 2014-01-27 (×2): 500 mg via ORAL
  Filled 2014-01-26 (×2): qty 1

## 2014-01-26 MED ORDER — CHLORDIAZEPOXIDE HCL 25 MG PO CAPS
50.0000 mg | ORAL_CAPSULE | Freq: Once | ORAL | Status: AC
  Administered 2014-01-26: 50 mg via ORAL
  Filled 2014-01-26: qty 2

## 2014-01-26 MED ORDER — VENLAFAXINE HCL ER 150 MG PO CP24
150.0000 mg | ORAL_CAPSULE | Freq: Two times a day (BID) | ORAL | Status: DC
  Administered 2014-01-26 – 2014-01-29 (×5): 150 mg via ORAL
  Filled 2014-01-26 (×2): qty 1
  Filled 2014-01-26: qty 28
  Filled 2014-01-26 (×6): qty 1
  Filled 2014-01-26: qty 28
  Filled 2014-01-26: qty 1

## 2014-01-26 MED ORDER — HYDROXYZINE HCL 25 MG PO TABS
25.0000 mg | ORAL_TABLET | Freq: Four times a day (QID) | ORAL | Status: DC | PRN
Start: 2014-01-26 — End: 2014-01-29
  Administered 2014-01-27 – 2014-01-29 (×2): 25 mg via ORAL
  Filled 2014-01-26 (×2): qty 1

## 2014-01-26 MED ORDER — MAGNESIUM HYDROXIDE 400 MG/5ML PO SUSP: 30.0000 mL | Freq: Every day | ORAL | Status: DC | PRN

## 2014-01-26 MED ORDER — LORAZEPAM 1 MG PO TABS: 1.0000 mg | ORAL_TABLET | Freq: Three times a day (TID) | ORAL | Status: DC | PRN

## 2014-01-26 MED ORDER — ACETAMINOPHEN 325 MG PO TABS: 650.0000 mg | ORAL_TABLET | ORAL | Status: DC | PRN

## 2014-01-26 MED ORDER — CHLORDIAZEPOXIDE HCL 25 MG PO CAPS
25.0000 mg | ORAL_CAPSULE | ORAL | Status: AC
  Administered 2014-01-28 (×2): 25 mg via ORAL
  Filled 2014-01-26 (×2): qty 1

## 2014-01-26 MED ORDER — ALBUTEROL SULFATE HFA 108 (90 BASE) MCG/ACT IN AERS: 2.0000 | INHALATION_SPRAY | Freq: Two times a day (BID) | RESPIRATORY_TRACT | Status: DC | PRN

## 2014-01-26 MED ORDER — ONDANSETRON HCL 4 MG PO TABS: 4.0000 mg | ORAL_TABLET | Freq: Three times a day (TID) | ORAL | Status: DC | PRN

## 2014-01-26 MED ORDER — CLONIDINE HCL 0.1 MG PO TABS
0.1000 mg | ORAL_TABLET | Freq: Four times a day (QID) | ORAL | Status: AC
  Administered 2014-01-26 – 2014-01-27 (×7): 0.1 mg via ORAL
  Filled 2014-01-26 (×8): qty 1

## 2014-01-26 MED ORDER — CHLORDIAZEPOXIDE HCL 25 MG PO CAPS
25.0000 mg | ORAL_CAPSULE | Freq: Every day | ORAL | Status: AC
  Administered 2014-01-29: 25 mg via ORAL
  Filled 2014-01-26: qty 1

## 2014-01-26 MED ORDER — CHLORDIAZEPOXIDE HCL 25 MG PO CAPS
25.0000 mg | ORAL_CAPSULE | Freq: Four times a day (QID) | ORAL | Status: AC
  Administered 2014-01-26 (×3): 25 mg via ORAL
  Filled 2014-01-26 (×3): qty 1

## 2014-01-26 MED ORDER — IBUPROFEN 800 MG PO TABS: 800.0000 mg | ORAL_TABLET | Freq: Three times a day (TID) | ORAL | Status: DC | PRN

## 2014-01-26 MED ORDER — VITAMIN B-1 100 MG PO TABS
100.0000 mg | ORAL_TABLET | Freq: Every day | ORAL | Status: DC
  Administered 2014-01-27 – 2014-01-29 (×3): 100 mg via ORAL
  Filled 2014-01-26 (×5): qty 1

## 2014-01-26 MED ORDER — ALUM & MAG HYDROXIDE-SIMETH 200-200-20 MG/5ML PO SUSP: 30.0000 mL | ORAL | Status: DC | PRN

## 2014-01-26 MED ORDER — AMPHETAMINE-DEXTROAMPHET ER 20 MG PO CP24: 20.0000 mg | ORAL_CAPSULE | Freq: Every morning | ORAL | Status: DC

## 2014-01-26 MED ORDER — METHOCARBAMOL 500 MG PO TABS
500.0000 mg | ORAL_TABLET | Freq: Three times a day (TID) | ORAL | Status: DC | PRN
  Administered 2014-01-26 – 2014-01-28 (×9): 500 mg via ORAL
  Filled 2014-01-26 (×9): qty 1
  Filled 2014-01-26: qty 30

## 2014-01-26 MED ORDER — ZOLPIDEM TARTRATE 10 MG PO TABS
10.0000 mg | ORAL_TABLET | Freq: Every evening | ORAL | Status: DC | PRN
  Administered 2014-01-26 – 2014-01-28 (×3): 10 mg via ORAL
  Filled 2014-01-26 (×3): qty 1

## 2014-01-26 MED ORDER — LOPERAMIDE HCL 2 MG PO CAPS
2.0000 mg | ORAL_CAPSULE | ORAL | Status: DC | PRN
  Administered 2014-01-27: 4 mg via ORAL
  Filled 2014-01-26: qty 1

## 2014-01-26 MED ORDER — AMPHETAMINE-DEXTROAMPHET ER 10 MG PO CP24
20.0000 mg | ORAL_CAPSULE | Freq: Every day | ORAL | Status: DC
  Administered 2014-01-26 – 2014-01-29 (×4): 20 mg via ORAL
  Filled 2014-01-26 (×4): qty 2
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
## 2014-01-26 NOTE — Discharge Instructions (Signed)
Chemical Dependency °Chemical dependency is an addiction to drugs or alcohol. It is characterized by the repeated behavior of seeking out and using drugs and alcohol despite harmful consequences to the health and safety of ones self and others.  °RISK FACTORS °There are certain situations or behaviors that increase a person's risk for chemical dependency. These include: °· A family history of chemical dependency. °· A history of mental health issues, including depression and anxiety. °· A home environment where drugs and alcohol are easily available to you. °· Drug or alcohol use at a young age. °SYMPTOMS  °The following symptoms can indicate chemical dependency: °· Inability to limit the use of drugs or alcohol. °· Nausea, sweating, shakiness, and anxiety that occurs when alcohol or drugs are not being used. °· An increase in amount of drugs or alcohol that is necessary to get drunk or high. °People who experience these symptoms can assess their use of drugs and alcohol by asking themselves the following questions: °· Have you been told by friends or family that they are worried about your use of alcohol or drugs? °· Do friends and family ever tell you about things you did while drinking alcohol or using drugs that you do not remember? °· Do you lie about using alcohol or drugs or about the amounts you use? °· Do you have difficulty completing daily tasks unless you use alcohol or drugs? °· Is the level of your work or school performance lower because of your drug or alcohol use? °· Do you get sick from using drugs or alcohol but keep using anyway? °· Do you feel uncomfortable in social situations unless you use alcohol or drugs? °· Do you use drugs or alcohol to help forget problems?  °An answer of yes to any of these questions may indicate chemical dependency. Professional evaluation is suggested. °Document Released: 09/15/2001 Document Revised: 12/14/2011 Document Reviewed: 11/27/2010 °ExitCare® Patient  Information ©2014 [employer]. ° ° ° ° ° °Emergency Department Resource Guide °1) Find a Doctor and Pay Out of Pocket °Although you won't have to find out who is covered by your insurance plan, it is a good idea to ask around and get recommendations. You will then need to call the office and see if the doctor you have chosen will accept you as a new patient and what types of options they offer for patients who are self-pay. Some doctors offer discounts or will set up payment plans for their patients who do not have insurance, but you will need to ask so you aren't surprised when you get to your appointment. ° °2) Contact Your Local Health Department °Not all health departments have doctors that can see patients for sick visits, but many do, so it is worth a call to see if yours does. If you don't know where your local health department is, you can check in your phone book. The CDC also has a tool to help you locate your state's health department, and many state websites also have listings of all of their local health departments. ° °3) Find a Walk-in Clinic °If your illness is not likely to be very severe or complicated, you may want to try a walk in clinic. These are popping up all over the country in pharmacies, drugstores, and shopping centers. They're usually staffed by nurse practitioners or physician assistants that have been trained to treat common illnesses and complaints. They're usually fairly quick and inexpensive. However, if you have serious medical issues or chronic medical problems, these   are probably not your best option. ° °No Primary Care Doctor: °- Call [facility] at  832-8000 - they can help you locate a primary care doctor that  accepts your insurance, provides certain services, etc. °- Physician Referral Service- 1-800-533-3463 ° °Chronic Pain Problems: °Organization         Address  Phone   Notes  °[facility]  (336) 297-2271 Patients need to be referred by their  primary care doctor.  ° °Medication Assistance: °Organization         Address  Phone   Notes  °[place] [facility] [address] °[place], [place] [address] (336) 641-8030 --Must be a resident of [place] °-- Must have NO insurance coverage whatsoever (no Medicaid/ Medicare, etc.) °-- The pt. MUST have a primary care doctor that directs their care regularly and follows them in the community °  °[facility]  (866) 331-1348   °[employer]  (888) 892-1162   ° °Agencies that provide inexpensive medical care: °Organization         Address  Phone   Notes  °[facility]  (336) 832-8035   °[facility]    (336) 832-7272   °[facility] [address] °[place], [place] [address] (336) 832-4777   °[facility] [address], °[place] (336) 271-4999   °[facility]    (336) 373-0678   °[facility]    (336) 272-1050   °[facility] ° [address], [place] Phone:  (336) 832-4444, Fax:  (336) 832-4440 Hours of Operation:  9 am - 6 pm, M-F.  Also accepts Medicaid/Medicare and self-pay.  °[facility] ° [address], [place] Phone: (336) [phone], Fax: (336) [phone]. Hours of Operation:  8:30 am - 5:30 pm, M-F.  Also accepts Medicaid and self-pay.  °[facility] [place] [address], [place] Phone: (336) 878-6027   °[facility] [address], [place], [place] (336)723-1848, Ext. 123 Mondays & Thursdays: 7-9 AM.  First 15 patients are seen on a first come, first serve basis. °  ° °Medicaid-accepting [place] Providers: ° °Organization         Address  Phone   Notes  °[facility] [address], [place] (336) 641-2100 Also accepts self-pay patients.  °[facility] [address], [place] ° (336) 856-9996   °[facility] [address], [place] (336) 288-8857   °[facility] [address] [place] [address], [place] (336) 299-7000   °Veita Bland [address], [place]  ° (336) 373-1557 Only accepts [place] Access Medicaid patients after they have their name applied to their card.  ° °Self-Pay (no insurance) in [place]: ° °Organization         Address  Phone   Notes  °Sickle Cell Patients, [facility] [address], [place] (336) 832-1970   °[facility] [address], [place] (336) 832-4400   °[facility] [place] ° [address], [place] (336) 992-4800   °[facility]/Dr. Osei-Bonsu ° [address] [place] [address], [place] or [address], [place] (336) 841-8500 Phone number for both [place] and [place] locations is the same.  °[facility] [address], [place] (336) 299-0000   °[facility] [address] [place] [address], [place] or [address] (336) 852-7530 °(336) 878-2260   °[facility] [address], [place] (336) 350-1642, phone; (336) 294-5005, fax Sees patients 1st and 3rd Saturday of every month.  Must not qualify for public or private insurance (i.e. Medicaid, Medicare, [place] Health Choice, Veterans' Benefits) • Household income should be no more than 200% of the poverty level •The clinic cannot treat you if you are pregnant or think you are pregnant • Sexually transmitted diseases are not treated at the clinic.  ° ° °Dental Care: °Organization         Address  Phone  Notes  °[facility] [address], [place] (336) 641-6152 Accepts children up to age 21 who are enrolled in Medicaid or [place] Health Choice; pregnant women with a Medicaid card; and children who have applied for Medicaid or [place] Health Choice, but were declined, whose parents can pay a reduced fee  at time of service.  °[facility] [place]  [address], [place] (336) 641-7733 Accepts children up to age 21 who are enrolled in Medicaid or [place] Health Choice; pregnant women with a Medicaid card; and children who have applied for Medicaid or [place] Health Choice, but were declined, whose parents can pay a reduced fee at time of service.  °[facility] ° [address], [place] (336) 641-4533 Patients are seen by appointment only. Walk-ins are not accepted. [facility] will see patients 18 years of age and older. °Monday - Tuesday (8am-5pm) °Most Wednesdays (8:30-5pm) °$30 per visit, cash only  °[facility] ° [address], [place] (336) 641-4533 Patients are seen by appointment only. Walk-ins are not accepted. [facility] will see patients 18 years of age and older. °One Wednesday Evening (Monthly: Volunteer Based).  $30 per visit, cash only  °[facility]  (919) 537-3737 for adults; Children under age 4, call Graduate Pediatric Dentistry at ([phone]) [phone]. Children aged 4-14, please call ([phone]) [phone] to request a pediatric application. ° Dental services are provided in all areas of dental care including fillings, crowns and bridges, complete and partial dentures, implants, gum treatment, root canals, and extractions. Preventive care is also provided. Treatment is provided to both adults and children. °Patients are selected via a lottery and there is often a waiting list. °  °[facility] [address], °[place] ° (336) 763-8833 [email or web address] °  °[facility] [address], [place], [place] (336)723-1848, Ext. 123 Second and Fourth Thursday of each month, opens at 6:30 AM; Clinic ends at 9 AM.  Patients are seen on a first-come first-served basis, and a limited number are seen during each clinic.  ° °[facility] ° [address],  [place], [place] (336) 723-7904   Eligibility Requirements °You must have lived in [place], [place], or [place] counties for at least the last three months. °  You cannot be eligible for state or federal sponsored healthcare insurance, including Veterans Administration, Medicaid, or Medicare. °  You generally cannot be eligible for healthcare insurance through your employer.  °  How to apply: °Eligibility screenings are held every Tuesday and Wednesday afternoon from 1:00 pm until 4:00 pm. You do not need an appointment for the interview!  °[facility] [address], [place], [place] 336-631-2330   °[facility]  336-342-8273   °[facility]  336-703-3100   °[facility]  336-570-6415   ° °Behavioral Health Resources in the Community: °Intensive Outpatient Programs °Organization         Address  Phone  Notes  °[facility] [address], [place], [place] 336-878-6098   °[facility] [address], [place], [place] 336-832-9800   °[facility] [address], [place], [place] ° 336-882-2125   °[facility] [address],  °[place], [place] 1-800-853-5163 or 336-641-4981   °Substance Abuse Resources °Organization         Address  Phone  Notes  °[facility]  336-882-2125   °[facility]  336-784-9470   °[facility]  336-285-9073   °[facility]  336-845-3988   °Residential & Outpatient Substance Abuse Program  1-800-659-3381   °Psychological Services °Organization         Address  Phone  Notes  ° Health  336- 832-9600   °[facility]  336- 378-7881   °[facility] [address], [place] 1-800-853-5163 or 336-641-4981   ° °Mobile Crisis Teams °Organization         Address  Phone  Notes  °[facility], Mobile Crisis Care Unit  1-877-626-1772   °Assertive °Psychotherapeutic  Services ° [address] [place], [place] 336-834-9664   °Sharon DeEsch [address] °[place] 336-554-5454   ° °Self-Help/Support Groups °Organization         Address  Phone             Notes  °[facility] [place] - variety of support groups  336- 373-1402 Call for more information  °Narcotics Anonymous (NA), [facility] [address], °[place]  2 meetings at this location  ° °Residential Treatment Programs °Organization         Address  Phone  Notes  °[facility] [address],    °[place]  1-866-801-8205   °[facility] ° [address], [place], [place] 704-293-8524   °[facility] [address], [place] 336-845-3988 Admissions: 8am-3pm M-F  °[facility] [address]    °[place], [place] 336-841-1104   °[facility] [address], [place], [place] 336-379-7146   °[facility] [address]  °[place], [place] 336-285-9073   °[facility] - Intensive Outpatient [address], [place], [place] 336-852-3033   °[facility] [address]  °[place], [place] 1-877-615-2722 or 336-784-9470   °[facility] [address] [place], [place] 336-227-7417 Accepts Medicaid  °[facility] [address]  ° [place] 1-800-659-3381 Substance Abuse/Addiction Treatment  ° °[place] Behavioral Health Resources °Organization         Address  Phone  Notes  °[facility]  (888) 581-9988   °Julie Brannon, PhD [address] [place], [place]   (336) 349-5553 or (336) 951-0000   °[facility]   [address] °[place], [place] (336) 349-4454   °[facility] [address], [place],  (336) 342-8316 Insurance/Medicaid/sponsorship through [facility]  °[facility] [address]                                      [place], [place] (336)  342-8316 Therapy/tele-psych/case  °[facility] [address]  ° [place], [place] (336) 349-2233    °Dr. Arfeen  (336) 349-4544   °[facility] [place]  [employer] [place] [facility] 1) [address],  °2) [address], [place] °3)  [address], [place] (336) 349-3220 °(336) 342-7768 ° °(336) 342-8140   °[place] [facility] (336) 342-1394 or (336) 342-3537 (After Hours)    ° ° ° °

## 2014-01-26 NOTE — H&P (Signed)
Psychiatric Admission Assessment Adult  Patient Identification:  Brittany Cortez  Date of Evaluation:  01/26/2014  Chief Complaint:  Domenic Moras Dependence MDD  History of Present Illness: Brittany Cortez is 28 years old, Caucasian female. She reports, "I admitted myself to the [facility] last night because I needed detox treatment from alcohol and drugs. I have been using drugs for years, some prescribed, others I bought from the streets. My drug use worsened over the last 6 years. I use because I like the way drugs/alcohol make me feel. I forget all about my problems when I use. I had quit alcohol about a month ago because I was charged with DUI. Then, I relapsed and started drinking again yesterday. I drink Malt beverages. I have never been to any treatment programs. My longest sobriety is a couple of months, and that was 2 years ago. I don't know if I want any long term treatment yet. I'm on depression medicine".   Elements:  Location:  Opioid dependence, Alcohol dependence. Quality:  body aches, restless leg, high anxiety levels, nausea. Severity:  Severe. Timing:  I've been using substances for years. Duration:  Chronic. Context:  "I use drugs & alcohol because i like how they make me feel".  Associated Signs/Synptoms:  Depression Symptoms:  depressed mood, feelings of worthlessness/guilt, hopelessness, anxiety, insomnia, loss of energy/fatigue,  (Hypo) Manic Symptoms:  Impulsivity,  Anxiety Symptoms:  Excessive Worry,  Psychotic Symptoms:  Hallucinations: None  PTSD Symptoms: Had a traumatic exposure:  Denies  Total Time spent with patient: 1 hour  Psychiatric Specialty Exam: Physical Exam  Skin:       ROS  Blood pressure 98/64, pulse 93, temperature 98.1 F (36.7 C), temperature source Oral, resp. rate 18, height 5' 5" (1.651 m), weight 66.679 kg (147 lb), last menstrual period 12/25/2013.Body mass index is 24.46 kg/(m^2).  General Appearance: Disheveled  Eye Contact::   Poor  Speech:  Clear and Coherent  Volume:  Normal  Mood:  Depressed and Worthless  Affect:  Congruent and Flat  Thought Process:  Coherent  Orientation:  Full (Time, Place, and Person)  Thought Content:  Rumination  Suicidal Thoughts:  No  Homicidal Thoughts:  No  Memory:  Immediate;   Good Recent;   Good Remote;   Good  Judgement:  Fair  Insight:  Fair  Psychomotor Activity:  Restlessness and Tremor  Concentration:  Fair  Recall:  Good  Fund of Knowledge:Fair  Language: Good  Akathisia:  No  Handed:  Right  AIMS (if indicated):     Assets:  Desire for Improvement  Sleep:       Musculoskeletal: Strength & Muscle Tone: within normal limits Gait & Station: normal Patient leans: N/A  Past Psychiatric History: Diagnosis: Alcohol dependence, Opioid dependence, Major depressive disorder, recurrent.  Hospitalizations: [facility] adult unit  Outpatient Care: None reported  Substance Abuse Care: None reported  Self-Mutilation: Denies  Suicidal Attempts: Denies attempts and or thoughts  Violent Behaviors: NA   Past Medical History:   Past Medical History  Diagnosis Date  . Asthma   . Headache(784.0)   . Depression   . Anxiety   . Narcotic abuse     history of   . ADHD (attention deficit hyperactivity disorder)    Cardiac History:  Asthma  Allergies:   Allergies  Allergen Reactions  . Darvocet [Propoxyphene N-Acetaminophen] Nausea And Vomiting  . Shellfish Allergy     Sneezing, wheezing    PTA Medications: Prescriptions prior to admission  Medication Sig Dispense Refill  . acetaminophen (TYLENOL) 500 MG tablet Take 1,000 mg by mouth daily as needed. For pain      . albuterol (PROAIR HFA) 108 (90 BASE) MCG/ACT inhaler Inhale 2 puffs into the lungs 2 (two) times daily as needed for wheezing.  1 Inhaler  2  . ALPRAZolam (XANAX) 1 MG tablet Take 1 mg by mouth 3 (three) times daily as needed for sleep.      Marland Kitchen amoxicillin-clavulanate (AUGMENTIN) 875-125 MG per tablet Take  1 tablet by mouth 2 (two) times daily. For 10 days  20 tablet  0  . amphetamine-dextroamphetamine (ADDERALL XR) 20 MG 24 hr capsule Take 20 mg by mouth every morning.      . Fluticasone-Salmeterol (ADVAIR) 100-50 MCG/DOSE AEPB Inhale 1 puff into the lungs daily.       Marland Kitchen ibuprofen (ADVIL,MOTRIN) 800 MG tablet Take 800 mg by mouth every 8 (eight) hours as needed for pain.      Marland Kitchen oxyCODONE-acetaminophen (PERCOCET/ROXICET) 5-325 MG per tablet Take 1 tablet by mouth every 4 (four) hours as needed for severe pain.  24 tablet  0  . silver sulfADIAZINE (SILVADENE) 1 % cream Apply 1 application topically daily.  50 g  2  . traMADol (ULTRAM) 50 MG tablet Take 1 tablet (50 mg total) by mouth every 6 (six) hours as needed.  15 tablet  0   Previous Psychotropic Medications: Medication/Dose  See medication lists               Substance Abuse History in the last 12 months:  yes  Consequences of Substance Abuse: Medical Consequences:  Liver damage, Possible death by overdose Legal Consequences:  Arrests, jail time, Loss of driving privilege. Family Consequences:  Family discord, divorce and or separation.  Social History:  reports that she has been smoking Cigarettes.  She has been smoking about 0.50 packs per day. She uses smokeless tobacco. She reports that she drinks alcohol. She reports that she does not use illicit drugs. 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  Legal History: DUI charge a month ago  Hobbies/Interests: NA  Family History:   Family History  Problem Relation Age of Onset  . Heart attack  Mother   . Diabetes Father   . Cancer Maternal Grandfather     lung    Results for orders placed during the hospital encounter of 01/25/14 (from the past 72 hour(s))  PREGNANCY, URINE     Status: None   Collection Time    01/26/14 12:30 AM      Result Value Ref Range   Preg Test, Ur NEGATIVE  NEGATIVE   Comment:            THE SENSITIVITY OF THIS     METHODOLOGY IS >20 mIU/mL.  URINE RAPID DRUG SCREEN (HOSP PERFORMED)     Status: Abnormal   Collection Time    01/26/14 12:30 AM      Result Value Ref Range   Opiates NONE DETECTED  NONE DETECTED   Cocaine NONE DETECTED  NONE DETECTED   Benzodiazepines POSITIVE (*) NONE DETECTED   Amphetamines NONE DETECTED  NONE DETECTED  Tetrahydrocannabinol NONE DETECTED  NONE DETECTED   Barbiturates NONE DETECTED  NONE DETECTED   Comment:            DRUG SCREEN FOR MEDICAL PURPOSES     ONLY.  IF CONFIRMATION IS NEEDED     FOR ANY PURPOSE, NOTIFY LAB     WITHIN 5 DAYS.                LOWEST DETECTABLE LIMITS     FOR URINE DRUG SCREEN     Drug Class       Cutoff (ng/mL)     Amphetamine      1000     Barbiturate      200     Benzodiazepine   684     Tricyclics       050     Opiates          300     Cocaine          300     THC              50  ETHANOL     Status: Abnormal   Collection Time    01/26/14 12:33 AM      Result Value Ref Range   Alcohol, Ethyl (B) 267 (*) 0 - 11 mg/dL   Comment:            LOWEST DETECTABLE LIMIT FOR     SERUM ALCOHOL IS 11 mg/dL     FOR MEDICAL PURPOSES ONLY  CBC WITH DIFFERENTIAL     Status: Abnormal   Collection Time    01/26/14 12:33 AM      Result Value Ref Range   WBC 11.4 (*) 4.0 - 10.5 K/uL   RBC 4.50  3.87 - 5.11 MIL/uL   Hemoglobin 14.4  12.0 - 15.0 g/dL   HCT 43.3  36.0 - 46.0 %   MCV 96.2  78.0 - 100.0 fL   MCH 32.0  26.0 - 34.0 pg   MCHC 33.3  30.0 - 36.0 g/dL   RDW 13.0  11.5 - 15.5 %   Platelets 457 (*) 150 - 400 K/uL   Neutrophils Relative % 71  43 - 77 %   Neutro Abs 8.1 (*)  1.7 - 7.7 K/uL   Lymphocytes Relative 21  12 - 46 %   Lymphs Abs 2.4  0.7 - 4.0 K/uL   Monocytes Relative 5  3 - 12 %   Monocytes Absolute 0.6  0.1 - 1.0 K/uL   Eosinophils Relative 2  0 - 5 %   Eosinophils Absolute 0.3  0.0 - 0.7 K/uL   Basophils Relative 1  0 - 1 %   Basophils Absolute 0.1  0.0 - 0.1 K/uL  BASIC METABOLIC PANEL     Status: Abnormal   Collection Time    01/26/14 12:33 AM      Result Value Ref Range   Sodium 146  137 - 147 mEq/L   Potassium 4.7  3.7 - 5.3 mEq/L   Chloride 107  96 - 112 mEq/L   CO2 30  19 - 32 mEq/L   Glucose, Bld 107 (*) 70 - 99 mg/dL   BUN 11  6 - 23 mg/dL   Creatinine, Ser 0.73  0.50 - 1.10 mg/dL   Calcium 9.6  8.4 - 10.5 mg/dL   GFR calc non Af Amer >90  >90 mL/min   GFR calc Af Amer >90  >90 mL/min  Comment: (NOTE)     The eGFR has been calculated using the CKD EPI equation.     This calculation has not been validated in all clinical situations.     eGFR's persistently <90 mL/min signify possible Chronic Kidney     Disease.  ACETAMINOPHEN LEVEL     Status: None   Collection Time    01/26/14  2:14 AM      Result Value Ref Range   Acetaminophen (Tylenol), Serum <15.0  10 - 30 ug/mL   Comment:            THERAPEUTIC CONCENTRATIONS VARY     SIGNIFICANTLY. A RANGE OF 10-30     ug/mL MAY BE AN EFFECTIVE     CONCENTRATION FOR MANY PATIENTS.     HOWEVER, SOME ARE BEST TREATED     AT CONCENTRATIONS OUTSIDE THIS     RANGE.     ACETAMINOPHEN CONCENTRATIONS     >150 ug/mL AT 4 HOURS AFTER     INGESTION AND >50 ug/mL AT 12     HOURS AFTER INGESTION ARE     OFTEN ASSOCIATED WITH TOXIC     REACTIONS.   Psychological Evaluations:  Assessment:   DSM5: Schizophrenia Disorders:  NA Obsessive-Compulsive Disorders:  NA Trauma-Stressor Disorders:  NA Substance/Addictive Disorders:  Alcohol Related Disorder - Severe (303.90) and Opioid Disorder - Severe (304.00) Depressive Disorders:  Major depressive disorder,  AXIS I:  Alcohol dependence,  Opioid dependence, Major depressive disorder, recurrent AXIS II:  Deferred AXIS III:   Past Medical History  Diagnosis Date  . Asthma   . Headache(784.0)   . Depression   . Anxiety   . Narcotic abuse     history of   . ADHD (attention deficit hyperactivity disorder)    AXIS IV:  other psychosocial or environmental problems and Polysubstance dependence AXIS V:  41-50 serious symptoms  Treatment Plan/Recommendations: 1. Admit for crisis management and stabilization, estimated length of stay 3-5 days.  2. Medication management to reduce current symptoms to base line and improve the patient's overall level of functioning  3. Treat health problems as indicated.  4. Develop treatment plan to decrease risk of relapse upon discharge and the need for readmission.  5. Psycho-social education regarding relapse prevention and self care.  6. Health care follow up as needed for medical problems.  7. Review, reconcile, and reinstate any pertinent home medications for other health issues where appropriate. 8. Call for consults with hospitalist for any additional specialty patient care services as needed.  Treatment Plan Summary: Daily contact with patient to assess and evaluate symptoms and progress in treatment Medication management  Current Medications:  Current Facility-Administered Medications  Medication Dose Route Frequency Provider Last Rate Last Dose  . albuterol (PROVENTIL HFA;VENTOLIN HFA) 108 (90 BASE) MCG/ACT inhaler 2 puff  2 puff Inhalation BID PRN Kristeen Mans, NP      . alum & mag hydroxide-simeth (MAALOX/MYLANTA) 200-200-20 MG/5ML suspension 30 mL  30 mL Oral Q4H PRN Kristeen Mans, NP      . amphetamine-dextroamphetamine (ADDERALL XR) 24 hr capsule 20 mg  20 mg Oral Daily Kristeen Mans, NP      . chlordiazePOXIDE (LIBRIUM) capsule 25 mg  25 mg Oral Q6H PRN Kristeen Mans, NP      . chlordiazePOXIDE (LIBRIUM) capsule 25 mg  25 mg Oral QID Kristeen Mans, NP       Followed by  .  [START ON 01/27/2014] chlordiazePOXIDE (LIBRIUM) capsule 25  mg  25 mg Oral TID Lurena Nida, NP       Followed by  . [START ON 01/28/2014] chlordiazePOXIDE (LIBRIUM) capsule 25 mg  25 mg Oral BH-qamhs Lurena Nida, NP       Followed by  . [START ON 01/29/2014] chlordiazePOXIDE (LIBRIUM) capsule 25 mg  25 mg Oral Daily Lurena Nida, NP      . cloNIDine (CATAPRES) tablet 0.1 mg  0.1 mg Oral QID Lurena Nida, NP       Followed by  . [START ON 01/28/2014] cloNIDine (CATAPRES) tablet 0.1 mg  0.1 mg Oral BH-qamhs Lurena Nida, NP       Followed by  . [START ON 01/30/2014] cloNIDine (CATAPRES) tablet 0.1 mg  0.1 mg Oral QAC breakfast Lurena Nida, NP      . dicyclomine (BENTYL) tablet 20 mg  20 mg Oral Q6H PRN Lurena Nida, NP      . hydrOXYzine (ATARAX/VISTARIL) tablet 25 mg  25 mg Oral Q6H PRN Lurena Nida, NP      . loperamide (IMODIUM) capsule 2-4 mg  2-4 mg Oral PRN Lurena Nida, NP      . magnesium hydroxide (MILK OF MAGNESIA) suspension 30 mL  30 mL Oral Daily PRN Lurena Nida, NP      . methocarbamol (ROBAXIN) tablet 500 mg  500 mg Oral Q8H PRN Lurena Nida, NP   500 mg at 01/26/14 0558  . mometasone-formoterol (DULERA) 100-5 MCG/ACT inhaler 2 puff  2 puff Inhalation BID Lurena Nida, NP      . multivitamin with minerals tablet 1 tablet  1 tablet Oral Daily Lurena Nida, NP      . naproxen (NAPROSYN) tablet 500 mg  500 mg Oral BID PRN Lurena Nida, NP   500 mg at 01/26/14 0558  . ondansetron (ZOFRAN-ODT) disintegrating tablet 4 mg  4 mg Oral Q6H PRN Lurena Nida, NP      . thiamine (B-1) injection 100 mg  100 mg Intramuscular Once Lurena Nida, NP      . Derrill Memo ON 01/27/2014] thiamine (VITAMIN B-1) tablet 100 mg  100 mg Oral Daily Lurena Nida, NP      . traMADol Veatrice Bourbon) tablet 50 mg  50 mg Oral Q6H PRN Lurena Nida, NP      . traZODone (DESYREL) tablet 50 mg  50 mg Oral QHS PRN Lurena Nida, NP        Observation Level/Precautions:  15 minute checks  Laboratory:  Per ED   Psychotherapy:  Group sessions, AA/NA meetings  Medications:  See medication lists  Consultations: As needed   Discharge Concerns:  Maintaining sobriety  Estimated LOS: 2-4 days  Other:     I certify that inpatient services furnished can reasonably be expected to improve the patient's condition.   Encarnacion Slates, PMHNP, FNP-BC 4/24/20159:54 AM Personally evaluated the patient reviewed the physical exam and agree with assessment and plan Geralyn Flash A. Sabra Heck, M.D.

## 2014-01-26 NOTE — Progress Notes (Signed)
NUTRITION ASSESSMENT  Pt identified as at risk on the Malnutrition Screen Tool  INTERVENTION: 1. Educated patient on the importance of nutrition and encouraged intake of food and beverages. 2. Encouraged pt to consume >50% of meals and snacks  NUTRITION DIAGNOSIS: Unintentional weight loss related to sub-optimal intake as evidenced by pt report.   Goal: Pt to meet >/= 90% of their estimated nutrition needs.  Monitor:  PO intake  Assessment:  Pt is 28 years old, Caucasian female. She reports, "I admitted myself to the [facility] last night because I needed detox treatment from alcohol and drugs. I have been using drugs for years, some prescribed, others I bought from the streets. My drug use worsened over the last 6 years. I use because I like the way drugs/alcohol make me feel. I forget all about my problems when I use. I had quit alcohol about a month ago because I was charged with DUI. Then, I relapsed and started drinking again yesterday. I drink Malt beverages. I have never been to any treatment programs. My longest sobriety is a couple of months, and that was 2 years ago. I don't know if I want any long term treatment yet. I'm on depression medicine".   Met with pt who kept eyes closed during most of visit, lying in bed. She reports poor appetite awhile. Eats things like bagels during the day. Had some goldfish crackers this morning for breakfast. Reports weighing 170 pounds 6 months ago, now weighs 147 pounds. Does not want any nutritional supplements.    28 y.o. female  Height: Ht Readings from Last 1 Encounters:  01/26/14 5' 5"  (1.651 m)    Weight: Wt Readings from Last 1 Encounters:  01/26/14 147 lb (66.679 kg)    Weight Hx: Wt Readings from Last 10 Encounters:  01/26/14 147 lb (66.679 kg)  01/25/14 150 lb (68.04 kg)  12/15/13 145 lb (65.772 kg)  05/31/13 152 lb (68.947 kg)  03/26/13 153 lb (69.4 kg)  01/19/13 158 lb (71.668 kg)  10/18/12 173 lb 9.6 oz  (78.744 kg)  12/16/11 190 lb (86.183 kg)  12/16/11 190 lb (86.183 kg)  12/10/11 190 lb (86.183 kg)    BMI:  Body mass index is 24.46 kg/(m^2). Pt meets criteria for normal weight based on current BMI.  Estimated Nutritional Needs: Kcal: 25-30 kcal/kg Protein: > 1 gram protein/kg Fluid: 1 ml/kcal  Diet Order: General Pt is also offered choice of unit snacks mid-morning and mid-afternoon.  Pt is eating as desired.   Lab results and medications reviewed. Getting multivitamin and thiamine.   Mikey College MS, [place], [place] Pager [phone] After Hours Pager

## 2014-01-26 NOTE — [facility] Group Notes (Signed)
[facility] LCSW Group Therapy  01/26/2014 2:42 PM  Type of Therapy:  Group Therapy  Participation Level:  Active  Participation Quality:  Attentive and Supportive  Affect:  Flat  Cognitive:  Alert and Oriented  Insight:  Developing/Improving  Engagement in Therapy:  Engaged  Modes of Intervention:  Discussion, Education, Exploration and Support  Summary of Progress/Problems:  Group today consisted of processing and education around the stages of change: precontemplation, contemplation, preparation, Action, Maintenance, and relapse.  Group first was educated about the different stages within the cycle and then processed barriers to change along with triggers associated with relapse.  Brittany Cortez attended group processing for the first time today. She was attentive and listened AEB nodding head and supporting other members validating her experience as it relates to others.  Brittany Cortez was quiet mostly, however appeared to be invested in discussion and process of change as she did ask questions and gave some personal experience.  Brittany Cortez 01/26/2014, 2:42 PM

## 2014-01-26 NOTE — ED Notes (Signed)
Pt states she hurts all over but mainly in her back and legs. Percocet given to pt per EDP request.

## 2014-01-26 NOTE — ED Notes (Signed)
Pt having telepsych at this time.

## 2014-01-26 NOTE — [facility] Suicide Risk Assessment (Signed)
Suicide Risk Assessment  Admission Assessment     Nursing information obtained from:  Patient;Review of record Demographic factors:  Adolescent or young adult;Divorced or widowed;Caucasian;Unemployed Current Mental Status:   (per pt, NO SI x 6 months) Loss Factors:  Legal issues Historical Factors:  Family history of mental illness or substance abuse;Impulsivity Risk Reduction Factors:  Responsible for children under 26 years of age;Sense of responsibility to family;Living with another person, especially a relative;Positive therapeutic relationship Total Time spent with patient: 1 hour  CLINICAL FACTORS:   Depression:   Comorbid alcohol abuse/dependence Impulsivity Alcohol/Substance Abuse/Dependencies  Psychiatric Specialty Exam:     Blood pressure 98/64, pulse 93, temperature 98.1 F (36.7 C), temperature source Oral, resp. rate 18, height 5\' 5"  (1.651 m), weight 66.679 kg (147 lb), last menstrual period 12/25/2013.Body mass index is 24.46 kg/(m^2).  General Appearance: Fairly Groomed  Patent attorneyye Contact::  Fair  Speech:  Clear and Coherent  Volume:  Normal  Mood:  Anxious and Depressed  Affect:  anxious, depressed  Thought Process:  Coherent and Goal Directed  Orientation:  Full (Time, Place, and Person)  Thought Content:  symtpoms, worries, concerns  Suicidal Thoughts:  No  Homicidal Thoughts:  No  Memory:  Immediate;   Fair Recent;   Fair Remote;   Fair  Judgement:  Fair  Insight:  Present and Shallow  Psychomotor Activity:  Restlessness  Concentration:  Fair  Recall:  [employer]is[employer]Fair  Fund of Knowledge:NA  Language: Fair  Akathisia:  No  Handed:    AIMS (if indicated):     Assets:  Desire for Improvement Housing Social Support  Sleep:      Musculoskeletal: Strength & Muscle Tone: within normal limits Gait & Station: normal Patient leans: N/A  COGNITIVE FEATURES THAT CONTRIBUTE TO RISK:  Closed-mindedness Polarized thinking Thought constriction (tunnel vision)     SUICIDE RISK:   Moderate:    PLAN OF CARE: Supportive approach/copign skills/relapse prevention                               Clonidine Detox                               Reassess and address the co morbidities I certify that inpatient services furnished can reasonably be expected to improve the patient's condition.  Rachael Feerving A Rahmah Mccamy 01/26/2014, 4:33 PM

## 2014-01-26 NOTE — ED Notes (Signed)
Pt's mom states pt needs something to help her sleep or the pt would leave the facility, EDP notified.

## 2014-01-26 NOTE — ED Provider Notes (Signed)
CSN: [id number]     Arrival date & time 01/25/14  2324 History   First MD Initiated Contact with Patient 01/25/14 2349     Chief Complaint  Patient presents with  . Detox      (Consider location/radiation/quality/duration/timing/severity/associated sxs/prior Treatment) The history is provided by the patient.   28 year old female comes to the emergency department with help with alcohol abuse. She drinks every day but she is very vague on the amount. She apparently did have some bleeding today but she will not tell me exactly when her last drink was. She also has a prescription medications which she states that she takes only as they are prescribed. She is on Adderall and alprazolam. She had been taking oxycodone and acetaminophen for leg pain secondary to restless leg syndrome, but states that she ran out about 4 days ago. She has used marijuana occasionally to help her sleep but denies any illicit drug use other than that. She does admit to depression and has constitutional symptoms including crying spells, early morning awakening, and anhedonia. She has had suicidal thoughts and didn't have a plan of driving her car off the road, but states that she has had feelings like that about 3 months ago. She denies hallucinations.  Past Medical History  Diagnosis Date  . Asthma   . Headache(784.0)   . Depression   . Anxiety   . Narcotic abuse     history of   . ADHD (attention deficit hyperactivity disorder)    Past Surgical History  Procedure Laterality Date  . No past surgeries    . Cesarean section  12/16/2011    Procedure: CESAREAN SECTION;  Surgeon: Lazaro Arms, MD;  Location: [facility];  Service: Gynecology;  Laterality: N/A;  . Ovarian cyst drainage  2013   Family History  Problem Relation Age of Onset  . Heart attack Mother   . Diabetes Father   . Cancer Maternal Grandfather     lung   History  Substance Use Topics  . Smoking status: Current Every Day Smoker -- 0.50 packs/day   Types: Cigarettes  . Smokeless tobacco: Current User  . Alcohol Use: Yes     Comment: heavily   OB History   Grav Para Term Preterm Abortions TAB SAB Ect Mult Living   1 1 1       1      Review of Systems  All other systems reviewed and are negative.     Allergies  Darvocet  Home Medications   Prior to Admission medications   Medication Sig Start Date End Date Taking? Authorizing Provider  acetaminophen (TYLENOL) 500 MG tablet Take 1,000 mg by mouth daily as needed. For pain    Historical Provider, MD  albuterol (PROAIR HFA) 108 (90 BASE) MCG/ACT inhaler Inhale 2 puffs into the lungs 2 (two) times daily as needed for wheezing. 08/10/13   Lazaro Arms, MD  ALPRAZolam Prudy Feeler) 1 MG tablet Take 1 mg by mouth 3 (three) times daily as needed for sleep.    Historical Provider, MD  amoxicillin-clavulanate (AUGMENTIN) 875-125 MG per tablet Take 1 tablet by mouth 2 (two) times daily. For 10 days 12/15/13   Tammy L. Triplett, PA-C  amphetamine-dextroamphetamine (ADDERALL XR) 20 MG 24 hr capsule Take 20 mg by mouth every morning.    Historical Provider, MD  Fluticasone-Salmeterol (ADVAIR) 100-50 MCG/DOSE AEPB Inhale 1 puff into the lungs daily.     Historical Provider, MD  ibuprofen (ADVIL,MOTRIN) 800 MG tablet Take 800  mg by mouth every 8 (eight) hours as needed for pain.    Historical Provider, MD  oxyCODONE-acetaminophen (PERCOCET/ROXICET) 5-325 MG per tablet Take 1 tablet by mouth every 4 (four) hours as needed for severe pain. 12/15/13   Tammy L. Triplett, PA-C  silver sulfADIAZINE (SILVADENE) 1 % cream Apply 1 application topically daily. 12/14/13   Lauren Doretha ImusM Parker, PA-C  traMADol (ULTRAM) 50 MG tablet Take 1 tablet (50 mg total) by mouth every 6 (six) hours as needed. 12/14/13   Lauren Doretha ImusM Parker, PA-C   BP 111/70  Pulse 94  Temp(Src) 98.8 F (37.1 C) (Oral)  Resp 16  Ht 5\' 7"  (1.702 m)  Wt 150 lb (68.04 kg)  BMI 23.49 kg/m2  SpO2 100%  LMP 12/25/2013 Physical Exam  Nursing note and  vitals reviewed.  28 year old female, resting comfortably and in no acute distress. Vital signs are normal. Oxygen saturation is 100%, which is normal. Head is normocephalic and atraumatic. PERRLA, EOMI. Oropharynx is clear. Neck is nontender and supple without adenopathy or JVD. Back is nontender and there is no CVA tenderness. Lungs are clear without rales, wheezes, or rhonchi. Chest is nontender. Heart has regular rate and rhythm without murmur. Abdomen is soft, flat, nontender without masses or hepatosplenomegaly and peristalsis is normoactive. Extremities have no cyanosis or edema, full range of motion is present. Skin is warm and dry without rash. Neurologic: She is clinically intoxicated and emotionally labile, cranial nerves are intact, there are no motor or sensory deficits. ED Course  Procedures (including critical care time) Labs Review Results for orders placed during the hospital encounter of 01/25/14  PREGNANCY, URINE      Result Value Ref Range   Preg Test, Ur NEGATIVE  NEGATIVE  ETHANOL      Result Value Ref Range   Alcohol, Ethyl (B) 267 (*) 0 - 11 mg/dL  URINE RAPID DRUG SCREEN (HOSP PERFORMED)      Result Value Ref Range   Opiates NONE DETECTED  NONE DETECTED   Cocaine NONE DETECTED  NONE DETECTED   Benzodiazepines POSITIVE (*) NONE DETECTED   Amphetamines NONE DETECTED  NONE DETECTED   Tetrahydrocannabinol NONE DETECTED  NONE DETECTED   Barbiturates NONE DETECTED  NONE DETECTED  CBC WITH DIFFERENTIAL      Result Value Ref Range   WBC 11.4 (*) 4.0 - 10.5 K/uL   RBC 4.50  3.87 - 5.11 MIL/uL   Hemoglobin 14.4  12.0 - 15.0 g/dL   HCT 14.743.3  82.936.0 - 56.246.0 %   MCV 96.2  78.0 - 100.0 fL   MCH 32.0  26.0 - 34.0 pg   MCHC 33.3  30.0 - 36.0 g/dL   RDW 13.013.0  86.511.5 - 78.415.5 %   Platelets 457 (*) 150 - 400 K/uL   Neutrophils Relative % 71  43 - 77 %   Neutro Abs 8.1 (*) 1.7 - 7.7 K/uL   Lymphocytes Relative 21  12 - 46 %   Lymphs Abs 2.4  0.7 - 4.0 K/uL   Monocytes  Relative 5  3 - 12 %   Monocytes Absolute 0.6  0.1 - 1.0 K/uL   Eosinophils Relative 2  0 - 5 %   Eosinophils Absolute 0.3  0.0 - 0.7 K/uL   Basophils Relative 1  0 - 1 %   Basophils Absolute 0.1  0.0 - 0.1 K/uL  BASIC METABOLIC PANEL      Result Value Ref Range   Sodium 146  137 -  147 mEq/L   Potassium 4.7  3.7 - 5.3 mEq/L   Chloride 107  96 - 112 mEq/L   CO2 30  19 - 32 mEq/L   Glucose, Bld 107 (*) 70 - 99 mg/dL   BUN 11  6 - 23 mg/dL   Creatinine, Ser 1.610.73  0.50 - 1.10 mg/dL   Calcium 9.6  8.4 - 09.610.5 mg/dL   GFR calc non Af Amer >90  >90 mL/min   GFR calc Af Amer >90  >90 mL/min  ACETAMINOPHEN LEVEL      Result Value Ref Range   Acetaminophen (Tylenol), Serum <15.0  10 - 30 ug/mL   MDM   Final diagnoses:  Alcohol abuse    Alcohol abuse. Of note, patient's mother also states that she is abusing prescription drugs. She does have prescriptions for alprazolam and Adderall which would be expected to show up in her drug screen is positive for amphetamines and benzodiazepines. She does have legitimate prescriptions for oxycodone-acetaminophen which she takes for restless leg syndrome. Patient initially was refusing to be seen in no was to leave and actually was prepared to discharge her when she changed her mind and states that she was willing to stay to get referred for detox. Her [place] the [place] [employer] controlled substance reporting website was reviewed and her last prescription for oxycodone-acetaminophen was March 26 when she had 30 tablets filled. She also had 90 tablets filled on March 18 and 24 tablets filled on March 13. If her prescription did run out 4 days ago, this would correspond to taking 4 tablets a day on average. Drug screen we'll not be able to differentiate between legitimate use of prescription narcotic and abuse. Although that are reviewed I do not see any relevant past visits.  Drug screen has come back negative for amphetamines and when told about this, she  states that she stopped taking her Adderall several weeks ago to try and save money. She is also negative for opiates which is consistent with her history that she last took her opiate prescription 4 days ago. TTS has been consulted and the patient has been [facility] Accepting physician is serving [place] Ac[place] M.D.  Dione Boozeavid Angles Trevizo, MD 01/26/14 [phone]

## 2014-01-26 NOTE — [facility] Group Notes (Signed)
[facility]   01/26/2014 12:10 PM  Participation Quality:    Did not attend  Brittany SorrowHannah N Eros Cortez
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
## 2014-01-26 NOTE — Tx Team (Signed)
Initial Interdisciplinary Treatment Plan  PATIENT STRENGTHS: (choose at least two) Average or above average intelligence Communication skills General fund of knowledge Physical Health Supportive family/friends  PATIENT STRESSORS: Legal issue Marital or family conflict Substance abuse   PROBLEM LIST: Problem List/Patient Goals Date to be addressed Date deferred Reason deferred Estimated date of resolution  Opiate dependence 01/26/14     ETOH abuse 01/26/14                                                DISCHARGE CRITERIA:  Improved stabilization in mood, thinking, and/or behavior Motivation to continue treatment in a less acute level of care Need for constant or close observation no longer present Reduction of life-threatening or endangering symptoms to within safe limits Safe-care adequate arrangements made Verbal commitment to aftercare and medication compliance Withdrawal symptoms are absent or subacute and managed without 24-hour nursing intervention  PRELIMINARY DISCHARGE PLAN: Attend aftercare/continuing care group Attend 12-step recovery group Outpatient therapy Return to previous living arrangement  PATIENT/FAMIILY INVOLVEMENT: This treatment plan has been presented to and reviewed with the patient, Brittany Cortez, and/or family member.  The patient and family have been given the opportunity to ask questions and make suggestions.  Brittany Cortez 01/26/2014, 6:56 AM
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
## 2014-01-26 NOTE — ED Notes (Signed)
I went in to room to explain to pt that she has to let lab get her blood if she wants detox. Pt denies and states " i don't give a damn I am leaving." I informed her mother that it was nothing I could do if she wanted to leave she could go.

## 2014-01-26 NOTE — Progress Notes (Signed)
D Brittany Cortez stayed in her bed...with the covers over her head, until noon. Refusing to come out from under the covers, she refused to wake up as well.     A  She did get OOB and take her meds at 1200 and has been OOB ever since. She has been asked to complete her morning self inventory but has yet to do so at this point. She demonstrates little engagemetn into her recovery and focuses on ' when I get to go home...'   R Safety is in place and poc maintained. Pt is given effexor today, per dr Dub Mikeslugo.

## 2014-01-27 DIAGNOSIS — F1910 Other psychoactive substance abuse, uncomplicated: Secondary | ICD-10-CM

## 2014-01-27 DIAGNOSIS — F1010 Alcohol abuse, uncomplicated: Secondary | ICD-10-CM
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
## 2014-01-27 NOTE — Progress Notes (Signed)
D Rolando has done a good job staying focused on her recovery today. She has attended her groups, she completed and submitted her assessment this morning and writes : her depression and hopelessness are "10/1", respectively, she denies having SI within the past 24 hrs and she writes her DC plan is to : " change the people I hang with".     A She makes good eye contact. She is overheard using a " little girl "  Voice  With her  peers. Shanita   Has attended her groups today, has taken notes, and has  completed her assisgnments in her patient handbook today.She takes her medications as ordered  And requested and was  Given immodium for diarrhea .   R Safety is in place and poc cont.
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
## 2014-01-27 NOTE — Progress Notes (Signed)
Patient attended AA meeting. Tonight we had a speaker meeting. They actively listened. Brittany Cortez [name] 11:44 PM

## 2014-01-27 NOTE — [facility] Group Notes (Signed)
[facility] LCSW Group Therapy Note  01/27/2014/10 AM  Type of Therapy and Topic:  Group Therapy: Avoiding Self-Sabotaging and Enabling Behaviors  Participation Level:  Did Not Attend      Catherine C Harrill, LCSW    

## 2014-01-28 MED ORDER — NICOTINE 21 MG/24HR TD PT24
MEDICATED_PATCH | TRANSDERMAL | Status: AC
  Administered 2014-01-28: 08:00:00
  Filled 2014-01-28: qty 1

## 2014-01-28 NOTE — Progress Notes (Signed)
[facility] Group Notes:  (Nursing/MHT/Case Management/Adjunct)  Date:  01/28/2014  Time:  6:26 PM  Type of Therapy:  Therapeutic Activity  Participation Level:  Active  Participation Quality:  Appropriate, Attentive and Supportive  Affect:  Appropriate  Cognitive:  Appropriate  Insight:  Appropriate  Engagement in Group:  Engaged and Supportive  Modes of Intervention:  Activity  Summary of Progress/Problems: Pts played an activity using the Therapeutic Ball. Pt was engaged.  Calton Harshfield C Aubryn Spinola 01/28/2014, 6:26 PM 
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
## 2014-01-28 NOTE — Progress Notes (Signed)
Patient ID: Brittany LeverKatie W Mossberg, female   DOB: [birth year], 28 y.o.   MRN: [id number] D)  Has been brighter today, pleasant, cooperative, and seems focused on making changes in her life so that she can be successful for her daughter.  Attended AA group this evening.  When environmental rounds were made, an e-cig was found under the heater in her room.. Was confronted about it after group, pt stated it was her mother's and to just throw it away.  Stated she didn't want to use it, that her mother had brought it in for her.  Stated she is feeling better about detox, other than feeling some anxiety.   A)  Will continue to monitor for safety, continue POC R)  Safety maintained.

## 2014-01-28 NOTE — [facility] Group Notes (Signed)
[facility] LCSW Group Therapy Note   01/28/2014 / 10:00 AM  Type of Therapy and Topic: Group Therapy: Feelings Around Returning Home & Establishing a Supportive Framework and Activity to Identify signs of Improvement or Decompensation   Participation Level:  Active  Mood:  Appropriate  Description of Group:  Patients first processed thoughts and feelings about up coming discharge. These included fears of upcoming changes, lack of change, new living environments, judgements and expectations from others and overall stigma of MH issues. We then discussed what is a supportive framework? What does it look like feel like and how do I discern it from and unhealthy non-supportive network? Learn how to cope when supports are not helpful and don't support you. Discuss what to do when your family/friends are not supportive.   Therapeutic Goals Addressed in Processing Group:  1. Patient will identify one healthy supportive network that they can use at discharge. 2. Patient will identify one factor of a supportive framework and how to tell it from an unhealthy network. 3. Patient able to identify one coping skill to use when they do not have positive supports from others. 4. Patient will demonstrate ability to communicate their needs through discussion and/or role plays.  Summary of Patient Progress:  Pt engaged easily during group session as others processed their anxiety about discharge and described healthy supports. Pt shared that both her parents are in recovery and are mostly supportive yet they can irritate her when they don't express trust in her. Pt also has friends in recovery (AA, NA) to turn to yetis honest that it will be difficult to give up some friends who are not safe for her right now.  Patient chose a visual to represent improvement as being out side and free and decompensation as being locked up and unable to see her precious 26 YO.   Carney Bernatherine C Harrill, LCSW

## 2014-01-28 NOTE — Progress Notes (Signed)
D Alda cont to handle her detox / withdrawal  Well. She is seen [facility] on the 300 hall today...tolerated quite well . She attends her groups as planned, she takes her meds and she is able to articulate her feelings and her needs  ie "jonesing vs. Anxiety".   A She completes her morning self inventory and on it she writes she was positive for  SI  " before I got here" but contracts for safety since being here.Marland Kitchen.Marland Kitchen.She rates her depression and hopelessness "10/1" and says her DC plans are " people, places and things".   R Safety is in place and poc maintained.

## 2014-01-28 NOTE — Progress Notes (Signed)
Patient attended AA group meeting, left in the middle to shower and returned for the end of group.

## 2014-01-29 MED ORDER — HYDROXYZINE HCL 25 MG PO TABS: ORAL_TABLET | ORAL | Status: DC

## 2014-01-29 MED ORDER — METHOCARBAMOL 500 MG PO TABS: 500.0000 mg | ORAL_TABLET | Freq: Three times a day (TID) | ORAL | Status: DC | PRN

## 2014-01-29 MED ORDER — HYDROXYZINE HCL 50 MG PO TABS
25.0000 mg | ORAL_TABLET | Freq: Three times a day (TID) | ORAL | Status: DC | PRN
  Filled 2014-01-29: qty 15

## 2014-01-29 MED ORDER — VENLAFAXINE HCL ER 150 MG PO CP24: 150.0000 mg | ORAL_CAPSULE | Freq: Two times a day (BID) | ORAL | Status: DC

## 2014-01-29 MED ORDER — AMPHETAMINE-DEXTROAMPHETAMINE 20 MG PO TABS: 20.0000 mg | ORAL_TABLET | Freq: Three times a day (TID) | ORAL | Status: DC

## 2014-01-29 MED ORDER — FLUTICASONE-SALMETEROL 250-50 MCG/DOSE IN AEPB: 1.0000 | INHALATION_SPRAY | Freq: Every day | RESPIRATORY_TRACT | Status: DC

## 2014-01-29 MED ORDER — ALBUTEROL SULFATE HFA 108 (90 BASE) MCG/ACT IN AERS: 2.0000 | INHALATION_SPRAY | Freq: Two times a day (BID) | RESPIRATORY_TRACT | Status: DC | PRN

## 2014-01-29 MED ORDER — HYDROXYZINE HCL 25 MG PO TABS
25.0000 mg | ORAL_TABLET | Freq: Three times a day (TID) | ORAL | Status: DC | PRN
  Filled 2014-01-29: qty 42

## 2014-01-29 MED ORDER — ZOLPIDEM TARTRATE 10 MG PO TABS: 10.0000 mg | ORAL_TABLET | Freq: Every evening | ORAL | Status: AC | PRN

## 2014-01-29 NOTE — [facility] Suicide Risk Assessment (Signed)
Suicide Risk Assessment  Discharge Assessment     Demographic Factors:  Adolescent or young adult and Caucasian  Total Time spent with patient: 45 minutes  Psychiatric Specialty Exam:     Blood pressure 98/66, pulse 88, temperature 98.3 F (36.8 C), temperature source Oral, resp. rate 16, height 5\' 5"  (1.651 m), weight 66.679 kg (147 lb), last menstrual period 12/25/2013, SpO2 100.00%.Body mass index is 24.46 kg/(m^2).  General Appearance: Fairly Groomed  Patent attorneyye Contact::  Fair  Speech:  Clear and Coherent  Volume:  Normal  Mood:  Anxious and worried  Affect:  Appropriate  Thought Process:  Coherent and Goal Directed  Orientation:  Full (Time, Place, and Person)  Thought Content:  relapse prevention plan  Suicidal Thoughts:  No  Homicidal Thoughts:  No  Memory:  Immediate;   Fair Recent;   Fair Remote;   Fair  Judgement:  Fair  Insight:  Present  Psychomotor Activity:  Normal  Concentration:  Fair  Recall:  FiservFair  Fund of Knowledge:NA  Language: Fair  Akathisia:  No  Handed:    AIMS (if indicated):     Assets:  Desire for Improvement Housing Social Support  Sleep:  Number of Hours: 3.75    Musculoskeletal: Strength & Muscle Tone: within normal limits Gait & Station: normal Patient leans: N/A   Mental Status Per Nursing Assessment::   On Admission:   (per pt, NO SI x 6 months)  Current Mental Status by Physician: in full contact with reality. There are no active S/S of withdrawal. She is committed to abstain from using opioids. States she feels well and will not pursue the Suboxone. Plans to find a part time job. Taking care of her daughter is a main priority   Loss Factors: NA  Historical Factors: NA  Risk Reduction Factors:   Responsible for children under 26 years of age, Sense of responsibility to family, Living with another person, especially a relative and Positive social support  Continued Clinical Symptoms:  Alcohol/Substance  Abuse/Dependencies  Cognitive Features That Contribute To Risk:  Closed-mindedness Polarized thinking Thought constriction (tunnel vision)    Suicide Risk:  Minimal: No identifiable suicidal ideation.  Patients presenting with no risk factors but with morbid ruminations; may be classified as minimal risk based on the severity of the depressive symptoms  Discharge Diagnoses:   AXIS I:  Opioid Dependence, GAD, ADHD, substance induced mood disorder AXIS II:  No diagnosis AXIS III:   Past Medical History  Diagnosis Date  . Asthma   . Headache(784.0)   . Depression   . Anxiety   . Narcotic abuse     history of   . ADHD (attention deficit hyperactivity disorder)    AXIS IV:  other psychosocial or environmental problems AXIS V:  61-70 mild symptoms  Plan Of Care/Follow-up recommendations:  Activity:  as tolerated Diet:  regular Follow up outpatient basis/AA Is patient on multiple antipsychotic therapies at discharge:  No   Has Patient had three or more failed trials of antipsychotic monotherapy by history:  No  Recommended Plan for Multiple Antipsychotic Therapies: NA    Brittany Cortez 01/29/2014, 11:17 AM

## 2014-01-29 NOTE — Progress Notes (Signed)
Patient ID: Brittany LeverKatie W Rispoli, female   DOB: 04-15-1986, 28 y.o.   MRN: [id number] She has been discharged home and was picked up by her boyfriend.  She voiced understanding of discharge instruction and of the follow plan. She denies thoughts of SI and all her belongings were taken home with her.

## 2014-01-29 NOTE — Progress Notes (Signed)
Patient ID: Brittany Cortez, female   DOB: 1986-09-07, 28 y.o.   MRN: [id number] D)  Has been out and about on the hall this evening, interacting appropriately with staff and select peers,  attended aa group but left before the end and went to her room with her roommate where they were found to be having a snack.  Still c/o some anxiety and still getting muscle spasms and cramping, , otherwise feels is improving, feeling better, doesn't want to relapse and go through this again. A)  Will continue to monitor for safety, continue POC R)  Safety maintained.

## 2014-01-29 NOTE — Tx Team (Signed)
Interdisciplinary Treatment Plan Update (Adult)  Date: 01/29/2014   Time Reviewed: 11:03 AM  Progress in Treatment:  Attending groups: Yes  Participating in groups:  Yes  Taking medication as prescribed: Yes  Tolerating medication: Yes  Family/Significant othe contact made: No. SPE not required for this pt.   Patient understands diagnosis: Yes, AEB seeking treatment for ETOH detox, med management, and mood stabilization.  Discussing patient identified problems/goals with staff: Yes  Medical problems stabilized or resolved: Yes  Denies suicidal/homicidal ideation: Yes  Patient has not harmed self or Others: Yes  New problem(s) identified:  Discharge Plan or Barriers: [facility] and plans to return home at d/c.  Additional comments: Brittany AddisonKatie is 28 years old, Caucasian female. She reports, "I admitted myself to [facility] last night because I needed detox treatment from alcohol and drugs. I have been using drugs for years, some prescribed, others I bought from the streets. My drug use worsened over the last 6 years. I use because I like the way drugs/alcohol make me feel. I forget all about my problems when I use. I had quit alcohol about a month ago because I was charged with DUI. Then, I relapsed and started drinking again yesterday. I drink Malt beverages. I have never been to any treatment programs. My longest sobriety is a couple of months, and that was 2 years ago. I don't know if I want any long term treatment yet. I'm on depression medicine".  Reason for Continuation of Hospitalization: d/c today  Estimated length of stay:  D/c today  For review of initial/current patient goals, please see plan of care.  Attendees:  Patient:    Family:    Physician: Geoffery LyonsIrving Lugo MD 01/29/2014 11:02 AM   Nursing: Lupita Leashonna RN 01/29/2014 11:02 AM   Clinical Social Worker Caroljean Monsivais Smart, LCSWA  01/29/2014 11:02 AM   Other: Christa RN  01/29/2014 11:02 AM   Other:    Other:     Other:    Scribe for Treatment Team:  The Sherwin-WilliamsHeather Smart LCSWA 01/29/2014 11:02 AM

## 2014-01-29 NOTE — Progress Notes (Signed)
Patient ID: Brittany LeverKatie W Ambriz, female   DOB: [birth year], 28 y.o.   MRN: [id number]

## 2014-01-29 NOTE — [facility] Group Notes (Signed)
[facility]   01/29/2014 11:01 AM  Participation Quality:  Appropriate   Mood/Affect:  Appropriate  Depression Rating:  0  Anxiety Rating:  2-3  Thoughts of Suicide:  No Will you contract for safety?   NA  Current AVH:  No  Plan for Discharge/Comments:  Pt reports that she decided to no longer take suboxine and was hoping for referral to [facility]-message left by CSW. Pt aware that either CSW or [facility] will call her with appts when they are scheduled (therapy and med management).   Transportation Means: friend   Supports: friends and some family supports identified   Teaching laboratory technicianHeather Smart LCSWA

## 2014-01-29 NOTE — Discharge Summary (Signed)
Physician Discharge Summary Note  Patient:  Brittany LeverKatie W Allbritton is an 28 y.o., female MRN:  [id number] DOB:  1986/07/16 Patient phone:  [phone] (home)  Patient address:   [address] [place] [id number],  Total Time spent with patient: Greater than 30 minutes  Date of Admission:  01/26/2014 Date of Discharge: 01/29/14  Reason for Admission: Opioid detox  Discharge Diagnoses: Active Problems:   Severe alcohol use disorder   Opioid dependence   ADHD (attention deficit hyperactivity disorder)   Substance induced mood disorder  General Appearance: Fairly Groomed   Patent attorneyye Contact:: Fair   Speech: Clear and Coherent   Volume: Normal   Mood: Anxious and worried   Affect: Appropriate   Thought Process: Coherent and Goal Directed   Orientation: Full (Time, Place, and Person)   Thought Content: relapse prevention plan   Suicidal Thoughts: No   Homicidal Thoughts: No   Memory: Immediate; Fair  Recent; Fair  Remote; Fair   Judgement: Fair   Insight: Present   Psychomotor Activity: Normal   Concentration: Fair   Recall: East[employer]akFair   Fund of Knowledge:NA   Language: Fair   Akathisia: No   Handed:   AIMS (if indicated):   Assets: Desire for Improvement  Housing  Social Support   Sleep: Number of Hours: 3.75    Psychiatric Specialty Exam: Physical Exam  Psychiatric: Her speech is normal and behavior is normal. Judgment and thought content normal. Her mood appears not anxious. Her affect is not angry, not blunt, not labile and not inappropriate. Cognition and memory are normal. She does not exhibit a depressed mood.    Review of Systems  Constitutional: Negative.   HENT: Negative.   Eyes: Negative.   Respiratory: Negative.   Cardiovascular: Negative.   Gastrointestinal: Negative.   Genitourinary: Negative.   Musculoskeletal: Negative.   Skin: Negative.   Neurological: Negative.   Endo/Heme/Allergies: Negative.   Psychiatric/Behavioral: Positive for substance abuse  (Opioid dependence). Negative for depression, suicidal ideas, hallucinations and memory loss. The patient has insomnia (Stable). The patient is not nervous/anxious.     Blood pressure 98/66, pulse 88, temperature 98.3 F (36.8 C), temperature source Oral, resp. rate 16, height 5\' 5"  (1.651 m), weight 66.679 kg (147 lb), last menstrual period 12/25/2013, SpO2 100.00%.Body mass index is 24.46 kg/(m^2).   Past Psychiatric History: Diagnosis: Opioid Disorder - Severe (304.00)  [facility]  [facility] [place], [place]  Substance Abuse Care: [facility] [place], [place]  Self-Mutilation: [place]  Suicidal Attempts: NA  Violent Behaviors: NA   Musculoskeletal: Strength & Muscle Tone: within normal limits Gait & Station: normal Patient leans: N/A  DSM5: Schizophrenia Disorders:  NA Obsessive-Compulsive Disorders:  NA Trauma-Stressor Disorders:  NA Substance/Addictive Disorders:  Opioid Disorder - Severe (304.00) Depressive Disorders:  NA  Axis Diagnosis:   AXIS I:  Opioid Disorder - Severe (304.00) AXIS II:  Deferred AXIS III:   Past Medical History  Diagnosis Date  . Asthma   . Headache(784.0)   . Depression   . Anxiety   . Narcotic abuse     history of   . ADHD (attention deficit hyperactivity disorder)    AXIS IV:  other psychosocial or environmental problems and Opioid dependence AXIS V:  62  Level of Care:  OP  Hospital Course:  "I admitted myself to the [facility] last night because I needed detox treatment from alcohol and drugs. I have been using drugs for years, some  prescribed, others I bought from the streets. My drug use worsened over the last 6 years. I use because I like the way drugs/alcohol make me feel. I forget all about my problems when I use. I had quit alcohol about a month ago because I was charged with DUI. Then, I relapsed and started drinking again yesterday. I drink Malt beverages. I have never been to any  treatment programs. My longest sobriety is a couple of months, and that was 2 years ago.  Florentina AddisonKatie was admitted to the hospital with her toxicology reports showing blood alcohol level of 267 and UDS reports positive for benzodiazepine. She was going through harsh withdrawal symptoms on arrival. Florentina AddisonKatie needed detoxification treatments. She received both Librium and Clonidine detox protocols. She was also ordered and received Adderrall 20 mg three times daily for ADHD, Effexor XR 150 mg two times daily for depression, Hydroxyzine 25 mg three times daily for anxiety and Ambien 10 mg Q bedtime for sleep. She was enrolled in the group counseling sessions and AA/NA meetings being offered and held on this unit. She learned coping skills. Florentina AddisonKatie was resumed on all her pertinent home medications for her other medical issues. She tolerated her treatment regimen without any significant adverse effects and or reactions.  Florentina AddisonKatie has completed detox treatment and her mood is stable. She is currently being discharged to continue treatment and counseling sessions at the [facility] [place], [place]. She has been provided with all the necessary information required to make this appointment without problems. Upon discharge, Florentina AddisonKatie adamantly denies any SIHI, AVH, delusional thoughts, paranoia and or withdrawal symptoms. She was provided with 14 days worth supply samples [facility]. [facility] in no distress. Transportation per family.  Consults:  psychiatry  Significant Diagnostic Studies:  labs: CBC with diff, CMP, UDS, Toxicology tetsts, U/A  Discharge Vitals:   Blood pressure 98/66, pulse 88, temperature 98.3 F (36.8 C), temperature source Oral, resp. rate 16, height 5\' 5"  (1.651 m), weight 66.679 kg (147 lb), last menstrual period 12/25/2013, SpO2 100.00%. Body mass index is 24.46 kg/(m^2). Lab Results:   No results found for this or any previous visit (from the  past 72 hour(s)).  Physical Findings: AIMS: Facial and Oral Movements Muscles of Facial Expression: None, normal Lips and Perioral Area: None, normal Jaw: None, normal Tongue: None, normal,Extremity Movements Upper (arms, wrists, hands, fingers): None, normal Lower (legs, knees, ankles, toes): None, normal, Trunk Movements Neck, shoulders, hips: None, normal, Overall Severity Severity of abnormal movements (highest score from questions above): None, normal Incapacitation due to abnormal movements: None, normal Patient's awareness of abnormal movements (rate only patient's report): No Awareness, Dental Status Current problems with teeth and/or dentures?: No Does patient usually wear dentures?: No  CIWA:  CIWA-Ar Total: 1 COWS:  COWS Total Score: 5  Psychiatric Specialty Exam: See Psychiatric Specialty Exam and Suicide Risk Assessment completed by Attending Physician prior to discharge.  Discharge destination:  Home  Is patient on multiple antipsychotic therapies at discharge:  No   Has Patient had three or more failed trials of antipsychotic monotherapy by history:  No  Recommended Plan for Multiple Antipsychotic Therapies: NA     Medication List    STOP taking these medications       acetaminophen 500 MG tablet  Commonly known as:  TYLENOL     ALPRAZolam 1 MG tablet  Commonly known as:  XANAX     ibuprofen 800  MG tablet  Commonly known as:  ADVIL,MOTRIN     oxyCODONE-acetaminophen 5-325 MG per tablet  Commonly known as:  PERCOCET/ROXICET      TAKE these medications     Indication   albuterol 108 (90 BASE) MCG/ACT inhaler  Commonly known as:  PROAIR HFA  Inhale 2 puffs into the lungs 2 (two) times daily as needed for wheezing or shortness of breath.   Indication:  Asthma     amphetamine-dextroamphetamine 20 MG tablet  Commonly known as:  ADDERALL  Take 1 tablet (20 mg total) by mouth 3 (three) times daily. For ADHD   Indication:  Attention Deficit Disorder      Fluticasone-Salmeterol 250-50 MCG/DOSE Aepb  Commonly known as:  ADVAIR  Inhale 1 puff into the lungs daily. For Asthma   Indication:  Asthma     hydrOXYzine 25 MG tablet  Commonly known as:  ATARAX/VISTARIL  Take 1 tablet (25 mg) three times daily as needed   Indication:  Tension     methocarbamol 500 MG tablet  Commonly known as:  ROBAXIN  Take 1 tablet (500 mg total) by mouth every 8 (eight) hours as needed for muscle spasms.   Indication:  Musculoskeletal Pain     venlafaxine XR 150 MG 24 hr capsule  Commonly known as:  EFFEXOR-XR  Take 1 capsule (150 mg total) by mouth 2 (two) times daily. For depression   Indication:  Major Depressive Disorder     zolpidem 10 MG tablet  Commonly known as:  AMBIEN  Take 1 tablet (10 mg total) by mouth at bedtime as needed for sleep.   Indication:  Trouble Sleeping       Follow-up Information   Follow up with [facility]. [name] Ane Payment from [facility] will call you to schedule appt for medication management (message left for her this morning).)    Contact information:   [address] [place], [place] [address] Phone: [phone] Fax: [phone]      Follow up with [facility] . [name] Ane Payment from [facility] will call you to schedule appt for therapy (message left for her this morning).)    Contact information:   [address] [place], [place] [address] Phone: [phone] Fax: [phone]     Follow-up recommendations:  Activity:  As tolerated Diet: As recommended by your primary care doctor. Keep all scheduled follow-up appointments as recommended.  Comments: Take all your medications as prescribed by your mental healthcare provider. Report any adverse effects and or reactions from your medicines to your outpatient provider promptly. Patient is instructed and cautioned to not engage in alcohol and or illegal drug use while on prescription medicines. In the event of worsening symptoms, patient  is instructed to call the crisis hotline, 911 and or go to the nearest ED for appropriate evaluation and treatment of symptoms. Follow-up with your primary care provider for your other medical issues, concerns and or health care needs.    Total Discharge Time:  Greater than 30 minutes.  Signed: Sanjuana Kava, PMHNp-BC 01/29/2014, 10:03 AM Personally evaluated the patient and agree with assessment and plan Madie Reno A. Hula Tasso,M.D.

## 2014-01-29 NOTE — Progress Notes (Signed)
Adult Psychoeducational Group Note  Date:  01/29/2014 Time:  1:06 PM  Group Topic/Focus:  Self Care:   The focus of this group is to help patients understand the importance of self-care in order to improve or restore emotional, physical, spiritual, interpersonal, and financial health.  Participation Level:  Active  Participation Quality:  Appropriate, Attentive, Sharing and Supportive  Affect:  Appropriate  Cognitive:  Appropriate  Insight: Appropriate and Good  Engagement in Group:  Engaged and Supportive  Modes of Intervention:  Discussion, Socialization and Support  Additional Comments:  Pt stated she needed to work on relationships to improve her self-care. Pt stated she could do this by spending more quality time with her daughter and build relationships with her family.  Caswell CorwinDana C Leib Elahi 01/29/2014, 1:06 PM

## 2014-01-29 NOTE — Progress Notes (Signed)
[facility] Discharge Plan :  Will you be returning to the same living situation after discharge: Yes,  home At discharge, do you have transportation home?:Yes,  friend Do you have the ability to pay for your medications:Yes,  [place] Medicaid  Release of information consent forms completed and submitted by CSW to Medical Records.   Patient to Follow up at: Follow-up Information   Follow up with [facility]. [name]([name] from [facility] will call you to schedule appt for medication management (message left for her this morning).)    Contact information:   [address] [place], [place] [address] Phone: [phone] Fax: [phone]      Follow up with [facility] . [name]([name] from [facility] will call you to schedule appt for therapy (message left for her this morning).)    Contact information:   [address] [place], [place] [address] Phone: [phone] Fax: [phone]      Patient denies SI/HI:   Yes,  during admission, group, and self report.     Safety Planning and Suicide Prevention discussed:  Yes,  SPE not required for this pt. SPI pamphlet provided to pt and she was encouraged to share information with support network, ask questions, and talk about any concerns relating to [employer]>  Brittany Cortez LCSWA  01/29/2014, 9:41 AM

## 2014-01-29 NOTE — [facility] Suicide Risk Assessment (Signed)
[facility] INPATIENT: Family/Significant Other Suicide Prevention Education  Suicide Prevention Education:  Education Completed; No one has been identified by the patient as the family member/significant other with whom the patient will be residing, and identified as the person(s) who will aid the patient in the event of a mental health crisis (suicidal ideations/suicide attempt).   Pt did not c/o SI at admission, nor have they endorsed SI during their stay here. SPE not required. SPI pamphlet provided to pt and she was encouraged to share information with support network, ask questions, and talk about any concerns relating to SPE.  The Sherwin-WilliamsHeather Smart, LCSWA 01/29/2014 9:37 AM

## 2014-02-02 NOTE — Progress Notes (Signed)
Patient Discharge Instructions:  After Visit Summary (AVS):   Faxed to:  02/02/14 Discharge Summary Note:   Faxed to:  02/02/14 Psychiatric Admission Assessment Note:   Faxed to:  02/02/14 Suicide Risk Assessment - Discharge Assessment:   Faxed to:  02/02/14 Faxed/Sent to the Next Level Care provider:  02/02/14 Faxed to [facility] @ [phone]  Jerelene ReddenSheena E [address], 02/02/2014, 1:44 PM

## 2014-08-06 ENCOUNTER — Encounter (HOSPITAL_COMMUNITY): Payer: Self-pay | Admitting: *Deleted

## 2015-05-07 ENCOUNTER — Encounter (HOSPITAL_COMMUNITY): Payer: Self-pay | Admitting: Neurology

## 2015-05-07 ENCOUNTER — Inpatient Hospital Stay (HOSPITAL_COMMUNITY)
Admission: EM | Admit: 2015-05-07 | Discharge: 2015-05-09 | DRG: 552 | Disposition: A | Discharge status: Home / Self Care | Payer: Medicaid Other | Attending: General Surgery | Admitting: General Surgery

## 2015-05-07 ENCOUNTER — Emergency Department (HOSPITAL_COMMUNITY): Payer: Medicaid Other

## 2015-05-07 DIAGNOSIS — F17210 Nicotine dependence, cigarettes, uncomplicated: Secondary | ICD-10-CM | POA: Diagnosis present

## 2015-05-07 DIAGNOSIS — F329 Major depressive disorder, single episode, unspecified: Secondary | ICD-10-CM | POA: Diagnosis present

## 2015-05-07 DIAGNOSIS — F419 Anxiety disorder, unspecified: Secondary | ICD-10-CM | POA: Diagnosis present

## 2015-05-07 DIAGNOSIS — Z7951 Long term (current) use of inhaled steroids: Secondary | ICD-10-CM | POA: Diagnosis not present

## 2015-05-07 DIAGNOSIS — S0083XA Contusion of other part of head, initial encounter: Secondary | ICD-10-CM | POA: Diagnosis present

## 2015-05-07 DIAGNOSIS — J939 Pneumothorax, unspecified: Secondary | ICD-10-CM

## 2015-05-07 DIAGNOSIS — S0181XA Laceration without foreign body of other part of head, initial encounter: Secondary | ICD-10-CM | POA: Diagnosis present

## 2015-05-07 DIAGNOSIS — T07XXXA Unspecified multiple injuries, initial encounter: Secondary | ICD-10-CM

## 2015-05-07 DIAGNOSIS — Y92410 Unspecified street and highway as the place of occurrence of the external cause: Secondary | ICD-10-CM

## 2015-05-07 DIAGNOSIS — M549 Dorsalgia, unspecified: Secondary | ICD-10-CM | POA: Diagnosis present

## 2015-05-07 DIAGNOSIS — F909 Attention-deficit hyperactivity disorder, unspecified type: Secondary | ICD-10-CM | POA: Diagnosis present

## 2015-05-07 DIAGNOSIS — S22029A Unspecified fracture of second thoracic vertebra, initial encounter for closed fracture: Secondary | ICD-10-CM | POA: Diagnosis present

## 2015-05-07 DIAGNOSIS — J45909 Unspecified asthma, uncomplicated: Secondary | ICD-10-CM | POA: Diagnosis present

## 2015-05-07 DIAGNOSIS — Z79899 Other long term (current) drug therapy: Secondary | ICD-10-CM

## 2015-05-07 DIAGNOSIS — S22009A Unspecified fracture of unspecified thoracic vertebra, initial encounter for closed fracture: Secondary | ICD-10-CM | POA: Diagnosis present

## 2015-05-07 LAB — COMPREHENSIVE METABOLIC PANEL
ALK PHOS: 72 U/L (ref 38–126)
ALT: 61 U/L — AB (ref 14–54)
ANION GAP: 10 (ref 5–15)
AST: 68 U/L — ABNORMAL HIGH (ref 15–41)
Albumin: 3.7 g/dL (ref 3.5–5.0)
BUN: 11 mg/dL (ref 6–20)
CO2: 22 mmol/L (ref 22–32)
Calcium: 8.7 mg/dL — ABNORMAL LOW (ref 8.9–10.3)
Chloride: 106 mmol/L (ref 101–111)
Creatinine, Ser: 0.95 mg/dL (ref 0.44–1.00)
GFR calc Af Amer: >60 mL/min (ref >60)
GFR calc non Af Amer: >60 mL/min (ref >60)
GLUCOSE: 84 mg/dL (ref 65–99)
Potassium: 3.4 mmol/L — ABNORMAL LOW (ref 3.5–5.1)
Sodium: 138 mmol/L (ref 135–145)
TOTAL PROTEIN: 6.6 g/dL (ref 6.5–8.1)
Total Bilirubin: 0.6 mg/dL (ref 0.3–1.2)

## 2015-05-07 LAB — RAPID URINE DRUG SCREEN, HOSP PERFORMED
Amphetamines: POSITIVE — AB
BARBITURATES: NOT DETECTED
Benzodiazepines: POSITIVE — AB
Cocaine: POSITIVE — AB
Opiates: POSITIVE — AB
Tetrahydrocannabinol: NOT DETECTED

## 2015-05-07 LAB — PREPARE FRESH FROZEN PLASMA
Unit division: 0
Unit division: 0

## 2015-05-07 LAB — URINALYSIS, ROUTINE W REFLEX MICROSCOPIC
Bilirubin Urine: NEGATIVE
Glucose, UA: NEGATIVE mg/dL
KETONES UR: 15 mg/dL — AB
Nitrite: POSITIVE — AB
Protein, ur: NEGATIVE mg/dL
Specific Gravity, Urine: 1.037 — ABNORMAL HIGH (ref 1.005–1.030)
Urobilinogen, UA: 1 mg/dL (ref 0.0–1.0)
pH: 6 (ref 5.0–8.0)

## 2015-05-07 LAB — CBC
HEMATOCRIT: 37.7 % (ref 36.0–46.0)
HEMOGLOBIN: 12.7 g/dL (ref 12.0–15.0)
MCH: 32.6 pg (ref 26.0–34.0)
MCHC: 33.7 g/dL (ref 30.0–36.0)
MCV: 96.9 fL (ref 78.0–100.0)
PLATELETS: 328 10*3/uL (ref 150–400)
RBC: 3.89 MIL/uL (ref 3.87–5.11)
RDW: 13 % (ref 11.5–15.5)
WBC: 19.6 10*3/uL — ABNORMAL HIGH (ref 4.0–10.5)

## 2015-05-07 LAB — URINE MICROSCOPIC-ADD ON

## 2015-05-07 LAB — CDS SEROLOGY

## 2015-05-07 LAB — PROTIME-INR
INR: 1.15 (ref 0.00–1.49)
Prothrombin Time: 14.9 seconds (ref 11.6–15.2)

## 2015-05-07 LAB — ABO/RH: ABO/RH(D): O NEG

## 2015-05-07 LAB — ETHANOL: Alcohol, Ethyl (B): 130 mg/dL — ABNORMAL HIGH (ref <5)

## 2015-05-07 MED ORDER — ONDANSETRON HCL 4 MG PO TABS: 4.0000 mg | ORAL_TABLET | Freq: Four times a day (QID) | ORAL | Status: DC | PRN

## 2015-05-07 MED ORDER — LORAZEPAM 2 MG/ML IJ SOLN
2.0000 mg | INTRAMUSCULAR | Status: DC | PRN
  Administered 2015-05-08: 2 mg via INTRAVENOUS
  Filled 2015-05-07: qty 1

## 2015-05-07 MED ORDER — HYDROMORPHONE HCL 1 MG/ML IJ SOLN
1.0000 mg | INTRAMUSCULAR | Status: DC | PRN
  Administered 2015-05-07 – 2015-05-08 (×6): 1 mg via INTRAVENOUS
  Filled 2015-05-07 (×6): qty 1

## 2015-05-07 MED ORDER — ENOXAPARIN SODIUM 40 MG/0.4ML ~~LOC~~ SOLN
40.0000 mg | SUBCUTANEOUS | Status: DC
  Administered 2015-05-08: 40 mg via SUBCUTANEOUS
  Filled 2015-05-07 (×2): qty 0.4

## 2015-05-07 MED ORDER — LIDOCAINE-EPINEPHRINE 1 %-1:100000 IJ SOLN
20.0000 mL | Freq: Once | INTRAMUSCULAR | Status: DC
  Filled 2015-05-07: qty 1

## 2015-05-07 MED ORDER — MIDAZOLAM HCL 2 MG/2ML IJ SOLN
INTRAMUSCULAR | Status: AC
  Filled 2015-05-07: qty 2

## 2015-05-07 MED ORDER — ALBUTEROL SULFATE (2.5 MG/3ML) 0.083% IN NEBU
2.5000 mg | INHALATION_SOLUTION | Freq: Two times a day (BID) | RESPIRATORY_TRACT | Status: DC | PRN
  Administered 2015-05-09: 2.5 mg via RESPIRATORY_TRACT
  Filled 2015-05-07: qty 3

## 2015-05-07 MED ORDER — BACITRACIN-NEOMYCIN-POLYMYXIN OINTMENT TUBE
TOPICAL_OINTMENT | CUTANEOUS | Status: DC | PRN
  Administered 2015-05-08: 22:00:00 via TOPICAL
  Filled 2015-05-07 (×2): qty 15

## 2015-05-07 MED ORDER — MIDAZOLAM HCL 2 MG/2ML IJ SOLN
2.0000 mg | Freq: Once | INTRAMUSCULAR | Status: AC
  Administered 2015-05-07: 2 mg via INTRAVENOUS

## 2015-05-07 MED ORDER — SODIUM CHLORIDE 0.9 % IV SOLN
INTRAVENOUS | Status: DC
  Administered 2015-05-07: 23:00:00 via INTRAVENOUS
  Administered 2015-05-08: 100 mL/h via INTRAVENOUS

## 2015-05-07 MED ORDER — PANTOPRAZOLE SODIUM 40 MG PO TBEC
40.0000 mg | DELAYED_RELEASE_TABLET | Freq: Every day | ORAL | Status: DC
  Administered 2015-05-09: 40 mg via ORAL
  Filled 2015-05-07: qty 1

## 2015-05-07 MED ORDER — FENTANYL CITRATE (PF) 100 MCG/2ML IJ SOLN
INTRAMUSCULAR | Status: AC
  Filled 2015-05-07: qty 2

## 2015-05-07 MED ORDER — ONDANSETRON HCL 4 MG/2ML IJ SOLN
4.0000 mg | Freq: Four times a day (QID) | INTRAMUSCULAR | Status: DC | PRN
  Administered 2015-05-09: 4 mg via INTRAVENOUS
  Filled 2015-05-07: qty 2

## 2015-05-07 MED ORDER — PANTOPRAZOLE SODIUM 40 MG IV SOLR
40.0000 mg | Freq: Every day | INTRAVENOUS | Status: DC
  Administered 2015-05-07: 40 mg via INTRAVENOUS
  Filled 2015-05-07 (×2): qty 40

## 2015-05-07 MED ORDER — NICOTINE 14 MG/24HR TD PT24
14.0000 mg | MEDICATED_PATCH | Freq: Every day | TRANSDERMAL | Status: DC
  Administered 2015-05-08 – 2015-05-09 (×2): 14 mg via TRANSDERMAL
  Filled 2015-05-07 (×3): qty 1

## 2015-05-07 MED ORDER — IOHEXOL 300 MG/ML  SOLN
100.0000 mL | Freq: Once | INTRAMUSCULAR | Status: AC | PRN
  Administered 2015-05-07: 100 mL via INTRAVENOUS

## 2015-05-07 MED ORDER — ALBUTEROL SULFATE HFA 108 (90 BASE) MCG/ACT IN AERS: 2.0000 | INHALATION_SPRAY | Freq: Two times a day (BID) | RESPIRATORY_TRACT | Status: DC | PRN

## 2015-05-07 MED ORDER — FENTANYL CITRATE (PF) 100 MCG/2ML IJ SOLN
50.0000 ug | Freq: Once | INTRAMUSCULAR | Status: AC
  Administered 2015-05-07: 50 ug via INTRAVENOUS
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
## 2015-05-07 NOTE — ED Notes (Signed)
Pt refusing blood work until [employer]" arrives and she is given pain medication because she doesn't handle pain well.
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
## 2015-05-07 NOTE — ED Notes (Signed)
Pt threatening to leave AMA, MD aware.
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
## 2015-05-07 NOTE — ED Notes (Signed)
Pt refuses to allow MD to suture face.

## 2015-05-07 NOTE — ED Notes (Signed)
All pt belongings given to pts boyfriend Thayer Ohm.

## 2015-05-07 NOTE — Progress Notes (Addendum)
Pt admitted into room 3S02 from ED. Pt alert and oriented X4. Pt is noncompliant. She is refusing to lay flat and log roll. Pt educated about rationale and continues to refuse to lay flat. [facility] and CMT notified about admission. Pt's significant other is at the bedside. Pt states she had a bladder infection prior to being admitted and is requesting neosporin ointment and a nicotine patch. Pt also may aware that she is suppose to lay flat and log roll. She refuses. Dr. Corliss Skains notified. New orders received to collect a UA, place nicotine patch, and put neosporin on abrasions. Will carry out orders and continue to monitor.
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
## 2015-05-07 NOTE — ED Notes (Signed)
Pt refused any blood work or another IV until "Thayer Ohm" gets here.

## 2015-05-07 NOTE — Progress Notes (Signed)
Family bedside.   No further support needed.   Chaplain signing off.   Gala Romney, Chaplain 05/07/2015 8:00 PM

## 2015-05-07 NOTE — ED Notes (Signed)
pts boyfriend at bedside. Pt agreeing to give blood at this time. Pt tearful.

## 2015-05-07 NOTE — Progress Notes (Signed)
Pt requested significant other Chris.   Thayer Ohm is bedside.   Gala Romney, Chaplain 05/07/2015 7:20 PM

## 2015-05-08 ENCOUNTER — Inpatient Hospital Stay (HOSPITAL_COMMUNITY): Payer: Medicaid Other

## 2015-05-08 LAB — BLOOD PRODUCT ORDER (VERBAL) VERIFICATION

## 2015-05-08 LAB — TYPE AND SCREEN
ABO/RH(D): O NEG
Antibody Screen: NEGATIVE
UNIT DIVISION: 0
UNIT DIVISION: 0

## 2015-05-08 LAB — CBC
HCT: 33 % — ABNORMAL LOW (ref 36.0–46.0)
HEMOGLOBIN: 11 g/dL — AB (ref 12.0–15.0)
MCH: 33.2 pg (ref 26.0–34.0)
MCHC: 33.3 g/dL (ref 30.0–36.0)
MCV: 99.7 fL (ref 78.0–100.0)
PLATELETS: 256 10*3/uL (ref 150–400)
RBC: 3.31 MIL/uL — ABNORMAL LOW (ref 3.87–5.11)
RDW: 13.2 % (ref 11.5–15.5)
WBC: 7.3 10*3/uL (ref 4.0–10.5)

## 2015-05-08 LAB — COMPREHENSIVE METABOLIC PANEL
ALT: 51 U/L (ref 14–54)
AST: 47 U/L — ABNORMAL HIGH (ref 15–41)
Albumin: 3 g/dL — ABNORMAL LOW (ref 3.5–5.0)
Alkaline Phosphatase: 64 U/L (ref 38–126)
Anion gap: 6 (ref 5–15)
BUN: 12 mg/dL (ref 6–20)
CALCIUM: 8.4 mg/dL — AB (ref 8.9–10.3)
CHLORIDE: 109 mmol/L (ref 101–111)
CO2: 25 mmol/L (ref 22–32)
CREATININE: 0.94 mg/dL (ref 0.44–1.00)
GFR calc non Af Amer: >60 mL/min (ref >60)
Glucose, Bld: 92 mg/dL (ref 65–99)
POTASSIUM: 3.5 mmol/L (ref 3.5–5.1)
SODIUM: 140 mmol/L (ref 135–145)
Total Bilirubin: 1.3 mg/dL — ABNORMAL HIGH (ref 0.3–1.2)
Total Protein: 5.5 g/dL — ABNORMAL LOW (ref 6.5–8.1)

## 2015-05-08 LAB — MRSA PCR SCREENING: MRSA by PCR: NEGATIVE

## 2015-05-08 MED ORDER — OXYCODONE HCL 5 MG PO TABS
5.0000 mg | ORAL_TABLET | ORAL | Status: DC | PRN
  Administered 2015-05-08 – 2015-05-09 (×5): 10 mg via ORAL
  Filled 2015-05-08 (×5): qty 2

## 2015-05-08 MED ORDER — HYDROMORPHONE HCL 1 MG/ML IJ SOLN
0.5000 mg | INTRAMUSCULAR | Status: DC | PRN
  Administered 2015-05-09 (×2): 0.5 mg via INTRAVENOUS
  Filled 2015-05-08 (×2): qty 1

## 2015-05-08 NOTE — ED Notes (Signed)
Pt refusing to lay flat on stretcher. Pt continues to grab at c-collar in an attempt to take it off. Pt continues to threaten to leave and makes attempts to get out of bed.
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
## 2015-05-08 NOTE — Progress Notes (Signed)
Pt is transferred from 3300 to 4N13. Admission vital sign is stable
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
## 2015-05-08 NOTE — Progress Notes (Signed)
Orthopedic Tech Progress Note Patient Details:  Brittany Cortez September 30, 1986 [id number]  Patient ID: [name], female   DOB: 01-08-1986, 29 y.o.   MRN: [id number] Called in bio-tech brace order; spoke with Richardean Chimera, Marley Charlot 05/08/2015, 1:21 PM

## 2015-05-08 NOTE — Progress Notes (Signed)
Upon assessment of pt this am, pt had crackers at bedside. Pt educated about importance of following diet. Pt refusing to lay flat and log roll. Education given. Pt stating she wants to get a in a wheelchair because she is claustrophobic. Reinforced the importance of awaiting for TSLO brace. Will continue to monitor.

## 2015-05-08 NOTE — Progress Notes (Signed)
Orthopedic Tech Progress Note Patient Details:  Brittany Cortez Nov 17, 1985 [id number] Brace order completed by bio-tech vendor. Patient ID: Brittany Cortez, female   DOB: [birth year], 29 y.o.   MRN: [id number]   [name] 05/08/2015, 7:00 PM

## 2015-05-09 DIAGNOSIS — T07XXXA Unspecified multiple injuries, initial encounter: Secondary | ICD-10-CM

## 2015-05-09 MED ORDER — MOMETASONE FURO-FORMOTEROL FUM 100-5 MCG/ACT IN AERO
2.0000 | INHALATION_SPRAY | Freq: Two times a day (BID) | RESPIRATORY_TRACT | Status: DC
  Filled 2015-05-09: qty 8.8

## 2015-05-09 MED ORDER — OXYCODONE-ACETAMINOPHEN 7.5-325 MG PO TABS: 1.0000 | ORAL_TABLET | ORAL | Status: DC | PRN

## 2015-05-09 MED ORDER — OXYCODONE HCL 5 MG PO TABS
5.0000 mg | ORAL_TABLET | ORAL | Status: DC | PRN
  Administered 2015-05-09: 15 mg via ORAL
  Filled 2015-05-09: qty 3

## 2015-05-09 MED ORDER — AMPHETAMINE-DEXTROAMPHETAMINE 10 MG PO TABS
20.0000 mg | ORAL_TABLET | Freq: Three times a day (TID) | ORAL | Status: DC
  Administered 2015-05-09 (×2): 20 mg via ORAL
  Filled 2015-05-09 (×2): qty 2

## 2015-05-09 MED ORDER — ALPRAZOLAM 0.5 MG PO TABS
1.0000 mg | ORAL_TABLET | Freq: Three times a day (TID) | ORAL | Status: DC
  Administered 2015-05-09 (×2): 1 mg via ORAL
  Filled 2015-05-09 (×2): qty 2

## 2015-05-09 MED ORDER — CYCLOBENZAPRINE HCL 10 MG PO TABS: 10.0000 mg | ORAL_TABLET | Freq: Three times a day (TID) | ORAL | Status: DC | PRN

## 2015-05-09 MED ORDER — HYDROMORPHONE HCL 1 MG/ML IJ SOLN: 0.5000 mg | INTRAMUSCULAR | Status: DC | PRN

## 2015-05-09 MED ORDER — ZOLPIDEM TARTRATE 5 MG PO TABS: 5.0000 mg | ORAL_TABLET | Freq: Every day | ORAL | Status: DC

## 2015-05-09 MED ORDER — BACITRACIN-NEOMYCIN-POLYMYXIN OINTMENT TUBE
TOPICAL_OINTMENT | Freq: Two times a day (BID) | CUTANEOUS | Status: DC
  Administered 2015-05-09: 1 via TOPICAL
  Filled 2015-05-09: qty 15

## 2015-05-09 MED ORDER — VENLAFAXINE HCL ER 75 MG PO CP24
75.0000 mg | ORAL_CAPSULE | Freq: Two times a day (BID) | ORAL | Status: DC
  Administered 2015-05-09: 75 mg via ORAL
  Filled 2015-05-09: qty 1
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
## 2015-05-09 NOTE — Care Management Note (Addendum)
Case Management Note  Patient Details  Name: Brittany Cortez MRN: [id number] Date of Birth: 1986/04/02  Subjective/Objective:                    Action/Plan:   Expected Discharge Date:                  Expected Discharge Plan:  Home/Self Care  In-House Referral:     Discharge planning Services  CM Consult  Post Acute Care Choice:  Durable Medical Equipment Choice offered to:     DME Arranged:  Walker rolling, 3 in 1  DME Agency:  [facility]  HH Arranged:    [facility] Agency:     Status of Service:  Completed, signed off  Medicare Important Message Given:    Date Medicare IM Given:    Medicare IM give by:    Date Additional Medicare IM Given:    Additional Medicare Important Message give by:     If discussed at Long Length of Stay Meetings, dates discussed:    Additional Comments:  Kingsley Plan, RN 05/09/2015, 1:15 PM

## 2015-05-09 NOTE — Progress Notes (Signed)
Brittany Cortez to be D/C'd Home per MD order.  Discussed with the patient and all questions fully answered.  VSS, Skin clean, dry and intact without evidence of skin break down, no evidence of skin tears noted. IV catheter discontinued intact. Site without signs and symptoms of complications. Dressing and pressure applied.  An After Visit Summary was printed and given to the patient. Patient received prescription.  D/c education completed with patient/family including follow up instructions, medication list, d/c activities limitations if indicated, with other d/c instructions as indicated by MD - patient able to verbalize understanding, all questions fully answered.   Patient instructed to return to ED, call 911, or call MD for any changes in condition.   Patient escorted via WC, and D/C home via private auto.  Brittany Cortez D 05/09/2015 2:54 PM
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
## 2015-05-09 NOTE — Progress Notes (Signed)
Pt left without pain prescription. Called and left message on home phone. Attemtped to reach her on cell phone with no answer.
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
## 2015-05-09 NOTE — Discharge Instructions (Signed)
Wash wounds daily in shower with soap and water. Do not soak. Apply antibiotic ointment (e.g. Neosporin) twice daily and as needed to keep moist. Cover with dry dressing.  No driving while taking oxycodone.  Keep brace on when sitting for standing.
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
## 2015-10-25 ENCOUNTER — Ambulatory Visit: Payer: Self-pay | Admitting: Adult Health

## 2015-10-31 ENCOUNTER — Encounter: Payer: Self-pay | Admitting: Adult Health

## 2015-10-31 ENCOUNTER — Ambulatory Visit (INDEPENDENT_AMBULATORY_CARE_PROVIDER_SITE_OTHER): Payer: Medicaid Other | Admitting: Adult Health

## 2015-10-31 VITALS — BP 120/70 | HR 104 | Ht 67.0 in | Wt 155.0 lb

## 2015-10-31 DIAGNOSIS — Z3490 Encounter for supervision of normal pregnancy, unspecified, unspecified trimester: Secondary | ICD-10-CM

## 2015-10-31 DIAGNOSIS — F909 Attention-deficit hyperactivity disorder, unspecified type: Secondary | ICD-10-CM

## 2015-10-31 DIAGNOSIS — Z3201 Encounter for pregnancy test, result positive: Secondary | ICD-10-CM | POA: Diagnosis not present

## 2015-10-31 DIAGNOSIS — F1990 Other psychoactive substance use, unspecified, uncomplicated: Secondary | ICD-10-CM

## 2015-10-31 DIAGNOSIS — O3680X1 Pregnancy with inconclusive fetal viability, fetus 1: Secondary | ICD-10-CM

## 2015-10-31 DIAGNOSIS — N926 Irregular menstruation, unspecified: Secondary | ICD-10-CM

## 2015-10-31 DIAGNOSIS — F17200 Nicotine dependence, unspecified, uncomplicated: Secondary | ICD-10-CM

## 2015-10-31 HISTORY — DX: Encounter for supervision of normal pregnancy, unspecified, unspecified trimester: Z34.90

## 2015-10-31 HISTORY — DX: Nicotine dependence, unspecified, uncomplicated: F17.200

## 2015-10-31 LAB — POCT URINE PREGNANCY: PREG TEST UR: POSITIVE — AB

## 2015-10-31 MED ORDER — PRENATAL PLUS 27-1 MG PO TABS
1.0000 | ORAL_TABLET | Freq: Every day | ORAL | Status: DC
Start: 2015-10-31 — End: 2015-12-06
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
## 2015-10-31 NOTE — Patient Instructions (Addendum)
Decrease cigarettes Take prenatal vitamins Return in 1 week for dating USFirst Trimester of Pregnancy The first trimester of pregnancy is from week 1 until the end of week 12 (months 1 through 3). A week after a sperm fertilizes an egg, the egg will implant on the wall of the uterus. This embryo will begin to develop into a baby. Genes from you and your partner are forming the baby. The female genes determine whether the baby is a boy or a girl. At 6-8 weeks, the eyes and face are formed, and the heartbeat can be seen on ultrasound. At the end of 12 weeks, all the baby's organs are formed.  Now that you are pregnant, you will want to do everything you can to have a healthy baby. Two of the most important things are to get good prenatal care and to follow your health care provider's instructions. Prenatal care is all the medical care you receive before the baby's birth. This care will help prevent, find, and treat any problems during the pregnancy and childbirth. BODY CHANGES Your body goes through many changes during pregnancy. The changes vary from woman to woman.   You may gain or lose a couple of pounds at first.  You may feel sick to your stomach (nauseous) and throw up (vomit). If the vomiting is uncontrollable, call your health care provider.  You may tire easily.  You may develop headaches that can be relieved by medicines approved by your health care provider.  You may urinate more often. Painful urination may mean you have a bladder infection.  You may develop heartburn as a result of your pregnancy.  You may develop constipation because certain hormones are causing the muscles that push waste through your intestines to slow down.  You may develop hemorrhoids or swollen, bulging veins (varicose veins).  Your breasts may begin to grow larger and become tender. Your nipples may stick out more, and the tissue that surrounds them (areola) may become darker.  Your gums may bleed and may  be sensitive to brushing and flossing.  Dark spots or blotches (chloasma, mask of pregnancy) may develop on your face. This will likely fade after the baby is born.  Your menstrual periods will stop.  You may have a loss of appetite.  You may develop cravings for certain kinds of food.  You may have changes in your emotions from day to day, such as being excited to be pregnant or being concerned that something may go wrong with the pregnancy and baby.  You may have more vivid and strange dreams.  You may have changes in your hair. These can include thickening of your hair, rapid growth, and changes in texture. Some women also have hair loss during or after pregnancy, or hair that feels dry or thin. Your hair will most likely return to normal after your baby is born. WHAT TO EXPECT AT YOUR PRENATAL VISITS During a routine prenatal visit:  You will be weighed to make sure you and the baby are growing normally.  Your blood pressure will be taken.  Your abdomen will be measured to track your baby's growth.  The fetal heartbeat will be listened to starting around week 10 or 12 of your pregnancy.  Test results from any previous visits will be discussed. Your health care provider may ask you:  How you are feeling.  If you are feeling the baby move.  If you have had any abnormal symptoms, such as leaking fluid, bleeding, severe headaches,  or abdominal cramping.  If you are using any tobacco products, including cigarettes, chewing tobacco, and electronic cigarettes.  If you have any questions. Other tests that may be performed during your first trimester include:  Blood tests to find your blood type and to check for the presence of any previous infections. They will also be used to check for low iron levels (anemia) and Rh antibodies. Later in the pregnancy, blood tests for diabetes will be done along with other tests if problems develop.  Urine tests to check for infections,  diabetes, or protein in the urine.  An ultrasound to confirm the proper growth and development of the baby.  An amniocentesis to check for possible genetic problems.  Fetal screens for spina bifida and Down syndrome.  You may need other tests to make sure you and the baby are doing well.  HIV (human immunodeficiency virus) testing. Routine prenatal testing includes screening for HIV, unless you choose not to have this test. HOME CARE INSTRUCTIONS  Medicines  Follow your health care provider's instructions regarding medicine use. Specific medicines may be either safe or unsafe to take during pregnancy.  Take your prenatal vitamins as directed.  If you develop constipation, try taking a stool softener if your health care provider approves. Diet  Eat regular, well-balanced meals. Choose a variety of foods, such as meat or vegetable-based protein, fish, milk and low-fat dairy products, vegetables, fruits, and whole grain breads and cereals. Your health care provider will help you determine the amount of weight gain that is right for you.  Avoid raw meat and uncooked cheese. These carry germs that can cause birth defects in the baby.  Eating four or five small meals rather than three large meals a day may help relieve nausea and vomiting. If you start to feel nauseous, eating a few soda crackers can be helpful. Drinking liquids between meals instead of during meals also seems to help nausea and vomiting.  If you develop constipation, eat more high-fiber foods, such as fresh vegetables or fruit and whole grains. Drink enough fluids to keep your urine clear or pale yellow. Activity and Exercise  Exercise only as directed by your health care provider. Exercising will help you:  Control your weight.  Stay in shape.  Be prepared for labor and delivery.  Experiencing pain or cramping in the lower abdomen or low back is a good sign that you should stop exercising. Check with your health  care provider before continuing normal exercises.  Try to avoid standing for long periods of time. Move your legs often if you must stand in one place for a long time.  Avoid heavy lifting.  Wear low-heeled shoes, and practice good posture.  You may continue to have sex unless your health care provider directs you otherwise. Relief of Pain or Discomfort  Wear a good support bra for breast tenderness.   Take warm sitz baths to soothe any pain or discomfort caused by hemorrhoids. Use hemorrhoid cream if your health care provider approves.   Rest with your legs elevated if you have leg cramps or low back pain.  If you develop varicose veins in your legs, wear support hose. Elevate your feet for 15 minutes, 3-4 times a day. Limit salt in your diet. Prenatal Care  Schedule your prenatal visits by the twelfth week of pregnancy. They are usually scheduled monthly at first, then more often in the last 2 months before delivery.  Write down your questions. Take them to your prenatal  visits.  Keep all your prenatal visits as directed by your health care provider. Safety  Wear your seat belt at all times when driving.  Make a list of emergency phone numbers, including numbers for family, friends, the hospital, and police and fire departments. General Tips  Ask your health care provider for a referral to a local prenatal education class. Begin classes no later than at the beginning of month 6 of your pregnancy.  Ask for help if you have counseling or nutritional needs during pregnancy. Your health care provider can offer advice or refer you to specialists for help with various needs.  Do not use hot tubs, steam rooms, or saunas.  Do not douche or use tampons or scented sanitary pads.  Do not cross your legs for long periods of time.  Avoid cat litter boxes and soil used by cats. These carry germs that can cause birth defects in the baby and possibly loss of the fetus by miscarriage or  stillbirth.  Avoid all smoking, herbs, alcohol, and medicines not prescribed by your health care provider. Chemicals in these affect the formation and growth of the baby.  Do not use any tobacco products, including cigarettes, chewing tobacco, and electronic cigarettes. If you need help quitting, ask your health care provider. You may receive counseling support and other resources to help you quit.  Schedule a dentist appointment. At home, brush your teeth with a soft toothbrush and be gentle when you floss. SEEK MEDICAL CARE IF:   You have dizziness.  You have mild pelvic cramps, pelvic pressure, or nagging pain in the abdominal area.  You have persistent nausea, vomiting, or diarrhea.  You have a bad smelling vaginal discharge.  You have pain with urination.  You notice increased swelling in your face, hands, legs, or ankles. SEEK IMMEDIATE MEDICAL CARE IF:   You have a fever.  You are leaking fluid from your vagina.  You have spotting or bleeding from your vagina.  You have severe abdominal cramping or pain.  You have rapid weight gain or loss.  You vomit blood or material that looks like coffee grounds.  You are exposed to [place] measles and have never had them.  You are exposed to fifth disease or chickenpox.  You develop a severe headache.  You have shortness of breath.  You have any kind of trauma, such as from a fall or a car accident.   This information is not intended to replace advice given to you by your health care provider. Make sure you discuss any questions you have with your health care provider.   Document Released: 09/15/2001 Document Revised: 10/12/2014 Document Reviewed: 08/01/2013 Elsevier Interactive Patient Education [employer] Try not to take percocet

## 2015-11-06 ENCOUNTER — Ambulatory Visit (INDEPENDENT_AMBULATORY_CARE_PROVIDER_SITE_OTHER): Payer: Medicaid Other

## 2015-11-06 DIAGNOSIS — O3680X1 Pregnancy with inconclusive fetal viability, fetus 1: Secondary | ICD-10-CM

## 2015-11-06 DIAGNOSIS — Z3A09 9 weeks gestation of pregnancy: Secondary | ICD-10-CM

## 2015-11-06 NOTE — Progress Notes (Signed)
[place] 8+4wks,single IUP w/ys, pos fht 173 bpm,normal ov's bilat,crl 21.59mm

## 2015-11-19 ENCOUNTER — Encounter: Payer: Medicaid Other | Admitting: Advanced Practice Midwife

## 2015-11-29 ENCOUNTER — Encounter: Payer: Medicaid Other | Admitting: Adult Health

## 2015-12-06 ENCOUNTER — Encounter: Payer: Self-pay | Admitting: Adult Health

## 2015-12-06 ENCOUNTER — Other Ambulatory Visit (HOSPITAL_COMMUNITY)
Admission: RE | Admit: 2015-12-06 | Discharge: 2015-12-06 | Disposition: A | Discharge status: Home / Self Care | Payer: Medicaid Other | Source: Ambulatory Visit | Attending: Adult Health | Admitting: Adult Health

## 2015-12-06 ENCOUNTER — Ambulatory Visit (INDEPENDENT_AMBULATORY_CARE_PROVIDER_SITE_OTHER): Payer: Medicaid Other | Admitting: Adult Health

## 2015-12-06 VITALS — BP 90/52 | HR 84 | Wt 150.0 lb

## 2015-12-06 DIAGNOSIS — Z3482 Encounter for supervision of other normal pregnancy, second trimester: Secondary | ICD-10-CM

## 2015-12-06 DIAGNOSIS — Z113 Encounter for screening for infections with a predominantly sexual mode of transmission: Secondary | ICD-10-CM | POA: Diagnosis present

## 2015-12-06 DIAGNOSIS — F909 Attention-deficit hyperactivity disorder, unspecified type: Secondary | ICD-10-CM

## 2015-12-06 DIAGNOSIS — Z369 Encounter for antenatal screening, unspecified: Secondary | ICD-10-CM

## 2015-12-06 DIAGNOSIS — Z0283 Encounter for blood-alcohol and blood-drug test: Secondary | ICD-10-CM

## 2015-12-06 DIAGNOSIS — Z3490 Encounter for supervision of normal pregnancy, unspecified, unspecified trimester: Secondary | ICD-10-CM | POA: Insufficient documentation

## 2015-12-06 DIAGNOSIS — Z01419 Encounter for gynecological examination (general) (routine) without abnormal findings: Secondary | ICD-10-CM | POA: Diagnosis not present

## 2015-12-06 DIAGNOSIS — Z331 Pregnant state, incidental: Secondary | ICD-10-CM

## 2015-12-06 DIAGNOSIS — N390 Urinary tract infection, site not specified: Secondary | ICD-10-CM

## 2015-12-06 DIAGNOSIS — F17200 Nicotine dependence, unspecified, uncomplicated: Secondary | ICD-10-CM

## 2015-12-06 DIAGNOSIS — F1990 Other psychoactive substance use, unspecified, uncomplicated: Secondary | ICD-10-CM

## 2015-12-06 DIAGNOSIS — Z1389 Encounter for screening for other disorder: Secondary | ICD-10-CM

## 2015-12-06 HISTORY — DX: Urinary tract infection, site not specified: N39.0

## 2015-12-06 LAB — POCT URINALYSIS DIPSTICK
Glucose, UA: NEGATIVE
KETONES UA: NEGATIVE
Nitrite, UA: POSITIVE
PROTEIN UA: NEGATIVE

## 2015-12-06 MED ORDER — PROMETHAZINE HCL 25 MG PO TABS: 25.0000 mg | ORAL_TABLET | Freq: Four times a day (QID) | ORAL | Status: DC | PRN

## 2015-12-06 MED ORDER — NITROFURANTOIN MONOHYD MACRO 100 MG PO CAPS: 100.0000 mg | ORAL_CAPSULE | Freq: Two times a day (BID) | ORAL | Status: DC
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
## 2015-12-06 NOTE — Patient Instructions (Signed)

## 2015-12-07 LAB — PMP SCREEN PROFILE (10S), URINE

## 2015-12-09 ENCOUNTER — Other Ambulatory Visit: Payer: Self-pay | Admitting: Obstetrics and Gynecology

## 2015-12-09 DIAGNOSIS — Z3682 Encounter for antenatal screening for nuchal translucency: Secondary | ICD-10-CM

## 2015-12-09 LAB — CBC
HEMATOCRIT: 40.8 % (ref 34.0–46.6)
Hemoglobin: 13.4 g/dL (ref 11.1–15.9)
MCH: 30.8 pg (ref 26.6–33.0)
MCHC: 32.8 g/dL (ref 31.5–35.7)
MCV: 94 fL (ref 79–97)
Platelets: 394 10*3/uL — ABNORMAL HIGH (ref 150–379)
RBC: 4.35 x10E6/uL (ref 3.77–5.28)
RDW: 12.3 % (ref 12.3–15.4)
WBC: 9 10*3/uL (ref 3.4–10.8)

## 2015-12-09 LAB — RPR: RPR: NONREACTIVE

## 2015-12-09 LAB — RUBELLA SCREEN: Rubella Antibodies, IGG: <0.9 index — ABNORMAL LOW (ref >0.99)

## 2015-12-09 LAB — HEPATITIS B SURFACE ANTIGEN: Hepatitis B Surface Ag: NEGATIVE

## 2015-12-09 LAB — HIV ANTIBODY (ROUTINE TESTING W REFLEX): HIV Screen 4th Generation wRfx: NONREACTIVE

## 2015-12-09 LAB — ANTIBODY SCREEN: ANTIBODY SCREEN: NEGATIVE

## 2015-12-09 LAB — ABO/RH: RH TYPE: NEGATIVE

## 2015-12-09 LAB — VARICELLA ZOSTER ANTIBODY, IGG: Varicella zoster IgG: 2820 index (ref >165)

## 2015-12-10 ENCOUNTER — Ambulatory Visit (INDEPENDENT_AMBULATORY_CARE_PROVIDER_SITE_OTHER): Payer: Medicaid Other

## 2015-12-10 ENCOUNTER — Other Ambulatory Visit: Payer: Medicaid Other

## 2015-12-10 DIAGNOSIS — Z36 Encounter for antenatal screening of mother: Secondary | ICD-10-CM | POA: Diagnosis not present

## 2015-12-10 DIAGNOSIS — Z3A14 14 weeks gestation of pregnancy: Secondary | ICD-10-CM

## 2015-12-10 DIAGNOSIS — Z369 Encounter for antenatal screening, unspecified: Secondary | ICD-10-CM

## 2015-12-10 DIAGNOSIS — Z3682 Encounter for antenatal screening for nuchal translucency: Secondary | ICD-10-CM

## 2015-12-10 LAB — CYTOLOGY - PAP

## 2015-12-10 NOTE — Progress Notes (Signed)
US 13+3 wks,measurements c/w dates,normal ov's bilat,fhr 136 bpm,post pl gr 0, NT 2 mm,NB present,crl 80mm

## 2015-12-11 LAB — URINE CULTURE

## 2015-12-12 LAB — MATERNAL SCREEN, INTEGRATED #1
Crown Rump Length: 80 mm
GEST. AGE ON COLLECTION DATE: 13.3 wk
Maternal Age at EDD: 28.3 years
NUCHAL TRANSLUCENCY (NT): 2 mm
Number of Fetuses: 1
PAPP-A Value: 1108.1 ng/mL
Weight: 150 [lb_av]

## 2016-01-03 ENCOUNTER — Encounter: Payer: Medicaid Other | Admitting: Obstetrics & Gynecology

## 2016-01-10 ENCOUNTER — Encounter: Payer: Medicaid Other | Admitting: Obstetrics & Gynecology

## 2016-01-10 ENCOUNTER — Encounter: Payer: Self-pay | Admitting: Obstetrics & Gynecology

## 2016-01-24 ENCOUNTER — Encounter: Payer: Medicaid Other | Admitting: Obstetrics & Gynecology

## 2016-01-28 ENCOUNTER — Encounter: Payer: Self-pay | Admitting: Obstetrics & Gynecology

## 2016-01-28 ENCOUNTER — Ambulatory Visit (INDEPENDENT_AMBULATORY_CARE_PROVIDER_SITE_OTHER): Payer: Medicaid Other | Admitting: Obstetrics & Gynecology

## 2016-01-28 VITALS — BP 90/60 | HR 80 | Wt 135.4 lb

## 2016-01-28 DIAGNOSIS — Z369 Encounter for antenatal screening, unspecified: Secondary | ICD-10-CM

## 2016-01-28 DIAGNOSIS — Z3A21 21 weeks gestation of pregnancy: Secondary | ICD-10-CM

## 2016-01-28 DIAGNOSIS — Z3482 Encounter for supervision of other normal pregnancy, second trimester: Secondary | ICD-10-CM

## 2016-01-28 MED ORDER — ALPRAZOLAM 0.5 MG PO TABS: 0.5000 mg | ORAL_TABLET | Freq: Three times a day (TID) | ORAL | Status: DC | PRN

## 2016-01-28 MED ORDER — ESCITALOPRAM OXALATE 10 MG PO TABS: 10.0000 mg | ORAL_TABLET | Freq: Every day | ORAL | Status: DC

## 2016-01-28 MED ORDER — ALBUTEROL SULFATE HFA 108 (90 BASE) MCG/ACT IN AERS: 2.0000 | INHALATION_SPRAY | Freq: Two times a day (BID) | RESPIRATORY_TRACT | Status: DC | PRN

## 2016-01-28 MED ORDER — FLUTICASONE-SALMETEROL 250-50 MCG/DOSE IN AEPB: 1.0000 | INHALATION_SPRAY | Freq: Every day | RESPIRATORY_TRACT | Status: DC

## 2016-01-28 NOTE — Addendum Note (Signed)
Addended by: Federico FlakeNES, Alegandro Macnaughton A on: 01/28/2016 04:52 PM   Modules accepted: Orders

## 2016-01-28 NOTE — Progress Notes (Signed)
G2P1001 3473w3d Estimated Date of Delivery: 06/13/16  Blood pressure 90/60, pulse 80, weight 135 lb 6.4 oz (61.417 kg), last menstrual period 08/10/2015.   BP weight and urine results all reviewed and noted.  Please refer to the obstetrical flow sheet for the fundal height and fetal heart rate documentation:  Patient reports good fetal movement, denies any bleeding and no rupture of membranes symptoms or regular contractions. Patient is without complaints. All questions were answered.  Orders Placed This Encounter  Procedures  . US OB Comp + 14 Wk    Plan:  Continued routine obstetrical care,   Current Meds  Medication Sig  . albuterol (PROAIR HFA) 108 (90 Base) MCG/ACT inhaler Inhale 2 puffs into the lungs 2 (two) times daily as needed for wheezing or shortness of breath.  . ALPRAZolam (XANAX) 1 MG tablet Take 1 mg by mouth 2 (two) times daily as needed.   Marland Kitchen. amphetamine-dextroamphetamine (ADDERALL) 20 MG tablet Take 1 tablet (20 mg total) by mouth 3 (three) times daily. For ADHD (Patient taking differently: Take 20 mg by mouth 2 (two) times daily. For ADHD)  . Fluticasone-Salmeterol (ADVAIR) 250-50 MCG/DOSE AEPB Inhale 1 puff into the lungs daily. For Asthma  . Pediatric Multiple Vit-C-FA (FLINSTONES GUMMIES OMEGA-3 DHA) CHEW Chew by mouth. Takes 2 daily  . promethazine (PHENERGAN) 25 MG tablet Take 1 tablet (25 mg total) by mouth every 6 (six) hours as needed for nausea or vomiting.  . [DISCONTINUED] albuterol (PROAIR HFA) 108 (90 BASE) MCG/ACT inhaler Inhale 2 puffs into the lungs 2 (two) times daily as needed for wheezing or shortness of breath.  . [DISCONTINUED] Fluticasone-Salmeterol (ADVAIR) 250-50 MCG/DOSE AEPB Inhale 1 puff into the lungs daily. For Asthma     Return in about 1 week (around 02/04/2016) for 20 week sono, [facility], with Dr Despina HiddenEure.

## 2016-02-03 ENCOUNTER — Other Ambulatory Visit: Payer: Self-pay | Admitting: Obstetrics & Gynecology

## 2016-02-03 DIAGNOSIS — Z1389 Encounter for screening for other disorder: Secondary | ICD-10-CM

## 2016-02-04 ENCOUNTER — Ambulatory Visit (INDEPENDENT_AMBULATORY_CARE_PROVIDER_SITE_OTHER): Payer: Medicaid Other | Admitting: Obstetrics & Gynecology

## 2016-02-04 ENCOUNTER — Ambulatory Visit (INDEPENDENT_AMBULATORY_CARE_PROVIDER_SITE_OTHER): Payer: Medicaid Other

## 2016-02-04 VITALS — BP 90/54 | HR 88 | Wt 140.0 lb

## 2016-02-04 DIAGNOSIS — Z36 Encounter for antenatal screening of mother: Secondary | ICD-10-CM

## 2016-02-04 DIAGNOSIS — Z331 Pregnant state, incidental: Secondary | ICD-10-CM

## 2016-02-04 DIAGNOSIS — Z1389 Encounter for screening for other disorder: Secondary | ICD-10-CM

## 2016-02-04 DIAGNOSIS — Z3A22 22 weeks gestation of pregnancy: Secondary | ICD-10-CM

## 2016-02-04 DIAGNOSIS — Z3482 Encounter for supervision of other normal pregnancy, second trimester: Secondary | ICD-10-CM

## 2016-02-04 LAB — POCT URINALYSIS DIPSTICK
GLUCOSE UA: NEGATIVE
KETONES UA: NEGATIVE
Leukocytes, UA: NEGATIVE
Nitrite, UA: NEGATIVE
RBC UA: NEGATIVE

## 2016-02-04 MED ORDER — HYDROCODONE-ACETAMINOPHEN 5-325 MG PO TABS: 1.0000 | ORAL_TABLET | Freq: Four times a day (QID) | ORAL | Status: DC | PRN
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
## 2016-02-04 NOTE — Progress Notes (Signed)
US 21+3 wks,cephalic,cx 3.3 cm,normal ov's bilat,post pl gr 0, fhr 155 bpm,svp of fluid 5.9 cm,anatomy complete no obvious abnormalities seen,measurements c/w dates

## 2016-02-06 LAB — MATERNAL SCREEN, INTEGRATED #2
AFP MARKER: 75.8 ng/mL
AFP MOM: 1.2
Crown Rump Length: 80 mm
DIA MoM: 2.27
DIA Value: 561.1 pg/mL
Estriol, Unconjugated: 1.71 ng/mL
GEST. AGE ON COLLECTION DATE: 13.3 wk
Gestational Age: 21.3 weeks
HCG VALUE: 26.6 [IU]/mL
MATERNAL AGE AT EDD: 28.3 a
Nuchal Translucency (NT): 2 mm
Nuchal Translucency MoM: 1.01
Number of Fetuses: 1
PAPP-A MOM: 0.83
PAPP-A VALUE: 1108.1 ng/mL
Test Results:: NEGATIVE
WEIGHT: 150 [lb_av]
Weight: 150 [lb_av]
hCG MoM: 1.27
uE3 MoM: 0.67

## 2016-02-27 ENCOUNTER — Encounter: Payer: Medicaid Other | Admitting: Advanced Practice Midwife

## 2016-03-04 ENCOUNTER — Encounter: Payer: Medicaid Other | Admitting: Obstetrics & Gynecology

## 2016-03-23 ENCOUNTER — Ambulatory Visit (INDEPENDENT_AMBULATORY_CARE_PROVIDER_SITE_OTHER): Payer: Medicaid Other | Admitting: Obstetrics & Gynecology

## 2016-03-23 ENCOUNTER — Encounter: Payer: Self-pay | Admitting: Obstetrics & Gynecology

## 2016-03-23 VITALS — BP 100/70 | HR 78 | Wt 140.0 lb

## 2016-03-23 DIAGNOSIS — Z1389 Encounter for screening for other disorder: Secondary | ICD-10-CM

## 2016-03-23 DIAGNOSIS — Z3A29 29 weeks gestation of pregnancy: Secondary | ICD-10-CM

## 2016-03-23 DIAGNOSIS — Z3483 Encounter for supervision of other normal pregnancy, third trimester: Secondary | ICD-10-CM

## 2016-03-23 DIAGNOSIS — Z331 Pregnant state, incidental: Secondary | ICD-10-CM

## 2016-03-23 LAB — POCT URINALYSIS DIPSTICK
Blood, UA: NEGATIVE
Glucose, UA: NEGATIVE
KETONES UA: NEGATIVE
Nitrite, UA: NEGATIVE
PROTEIN UA: 1

## 2016-03-23 MED ORDER — ESCITALOPRAM OXALATE 20 MG PO TABS: 10.0000 mg | ORAL_TABLET | Freq: Every day | ORAL | Status: DC

## 2016-03-23 NOTE — Progress Notes (Signed)
G2P1001 4953w2d Estimated Date of Delivery: 06/13/16  Blood pressure 100/70, pulse 78, weight 140 lb (63.504 kg), last menstrual period 08/10/2015.   BP weight and urine results all reviewed and noted.  Please refer to the obstetrical flow sheet for the fundal height and fetal heart rate documentation:  Patient reports good fetal movement, denies any bleeding and no rupture of membranes symptoms or regular contractions. Patient is without complaints. All questions were answered.  Orders Placed This Encounter  Procedures  . POCT urinalysis dipstick    Plan:  Continued routine obstetrical care, increase lexapro to 20 mg daily  No Follow-up on file.

## 2016-03-26 ENCOUNTER — Other Ambulatory Visit: Payer: Self-pay | Admitting: Obstetrics & Gynecology

## 2016-03-30 ENCOUNTER — Other Ambulatory Visit: Payer: Medicaid Other

## 2016-03-30 ENCOUNTER — Ambulatory Visit (INDEPENDENT_AMBULATORY_CARE_PROVIDER_SITE_OTHER): Payer: Medicaid Other | Admitting: Obstetrics & Gynecology

## 2016-03-30 ENCOUNTER — Encounter: Payer: Self-pay | Admitting: Obstetrics & Gynecology

## 2016-03-30 VITALS — BP 100/70 | HR 78 | Wt 142.0 lb

## 2016-03-30 DIAGNOSIS — Z3A30 30 weeks gestation of pregnancy: Secondary | ICD-10-CM

## 2016-03-30 DIAGNOSIS — Z3483 Encounter for supervision of other normal pregnancy, third trimester: Secondary | ICD-10-CM

## 2016-03-30 DIAGNOSIS — Z331 Pregnant state, incidental: Secondary | ICD-10-CM

## 2016-03-30 DIAGNOSIS — Z131 Encounter for screening for diabetes mellitus: Secondary | ICD-10-CM

## 2016-03-30 DIAGNOSIS — Z369 Encounter for antenatal screening, unspecified: Secondary | ICD-10-CM

## 2016-03-30 DIAGNOSIS — Z1389 Encounter for screening for other disorder: Secondary | ICD-10-CM

## 2016-03-30 LAB — POCT URINALYSIS DIPSTICK
GLUCOSE UA: NEGATIVE
KETONES UA: NEGATIVE
Leukocytes, UA: NEGATIVE
NITRITE UA: NEGATIVE
Protein, UA: NEGATIVE
RBC UA: NEGATIVE

## 2016-03-30 MED ORDER — ESCITALOPRAM OXALATE 20 MG PO TABS: 20.0000 mg | ORAL_TABLET | Freq: Every day | ORAL | Status: DC

## 2016-03-30 NOTE — Addendum Note (Signed)
Addended by: Lazaro ArmsEURE, Makara Lanzo H on: 03/30/2016 09:56 AM   Modules accepted: Orders

## 2016-03-30 NOTE — Progress Notes (Signed)
G2P1001 2780w2d Estimated Date of Delivery: 06/13/16  Blood pressure 100/70, pulse 78, weight 142 lb (64.411 kg), last menstrual period 08/10/2015.   BP weight and urine results all reviewed and noted.  Please refer to the obstetrical flow sheet for the fundal height and fetal heart rate documentation:  Patient reports good fetal movement, denies any bleeding and no rupture of membranes symptoms or regular contractions. Patient is without complaints. All questions were answered.  Orders Placed This Encounter  Procedures  . POCT urinalysis dipstick    Plan:  Continued routine obstetrical care,   Xanax 0.5 mg refilled BID, #33, 2 week supply   No Follow-up on file.

## 2016-03-31 LAB — GLUCOSE TOLERANCE, 2 HOURS W/ 1HR
GLUCOSE, 1 HOUR: 85 mg/dL (ref 65–179)
GLUCOSE, FASTING: 72 mg/dL (ref 65–91)
Glucose, 2 hour: 68 mg/dL (ref 65–152)

## 2016-03-31 LAB — CBC
HEMATOCRIT: 34.9 % (ref 34.0–46.6)
Hemoglobin: 11.7 g/dL (ref 11.1–15.9)
MCH: 29.3 pg (ref 26.6–33.0)
MCHC: 33.5 g/dL (ref 31.5–35.7)
MCV: 88 fL (ref 79–97)
PLATELETS: 424 10*3/uL — AB (ref 150–379)
RBC: 3.99 x10E6/uL (ref 3.77–5.28)
RDW: 13.5 % (ref 12.3–15.4)
WBC: 8.1 10*3/uL (ref 3.4–10.8)

## 2016-03-31 LAB — HIV ANTIBODY (ROUTINE TESTING W REFLEX): HIV Screen 4th Generation wRfx: NONREACTIVE

## 2016-03-31 LAB — RPR: RPR Ser Ql: NONREACTIVE

## 2016-03-31 LAB — ANTIBODY SCREEN: Antibody Screen: NEGATIVE

## 2016-04-08 ENCOUNTER — Emergency Department (HOSPITAL_COMMUNITY)
Admission: EM | Admit: 2016-04-08 | Discharge: 2016-04-08 | Disposition: A | Discharge status: Home / Self Care | Payer: Medicaid Other | Attending: Emergency Medicine | Admitting: Emergency Medicine

## 2016-04-08 ENCOUNTER — Encounter (HOSPITAL_COMMUNITY): Payer: Self-pay | Admitting: *Deleted

## 2016-04-08 DIAGNOSIS — F17210 Nicotine dependence, cigarettes, uncomplicated: Secondary | ICD-10-CM | POA: Diagnosis not present

## 2016-04-08 DIAGNOSIS — O26893 Other specified pregnancy related conditions, third trimester: Secondary | ICD-10-CM | POA: Diagnosis present

## 2016-04-08 DIAGNOSIS — R1013 Epigastric pain: Secondary | ICD-10-CM | POA: Diagnosis not present

## 2016-04-08 DIAGNOSIS — O99333 Smoking (tobacco) complicating pregnancy, third trimester: Secondary | ICD-10-CM | POA: Insufficient documentation

## 2016-04-08 DIAGNOSIS — Z3483 Encounter for supervision of other normal pregnancy, third trimester: Secondary | ICD-10-CM

## 2016-04-08 DIAGNOSIS — J45909 Unspecified asthma, uncomplicated: Secondary | ICD-10-CM | POA: Insufficient documentation

## 2016-04-08 MED ORDER — METOCLOPRAMIDE HCL 5 MG/ML IJ SOLN: 10.0000 mg | Freq: Once | INTRAMUSCULAR | Status: DC

## 2016-04-08 NOTE — ED Notes (Signed)
OB rapid response at bedside.  Pt on fetal monitor at this time

## 2016-04-08 NOTE — ED Notes (Signed)
OB Rapid Response paged

## 2016-04-08 NOTE — Progress Notes (Addendum)
1610  Arrived to evaluate this 30 yo G2P1 @ 30.[redacted] wks GA in with complaint of upper abdominal pain that was intermittent but is now resolving.  She denies vaginal bleeding or leaking of fluid, and reports good fetal movement. [facility]  She has a history of a C/S for breech with first baby. She also has a history of substance abuse.  Admits marijuana during this pregnancy but denies other substance use.  However, in [facility] she has admitted to use of xanax, adderal, and percocet with and without prescription.  FHR reassuring 150's with 10 X 10's.  Rare UC. 1710 [facility] FearFerguson.  Provider requests call back in 10 min after sign out. 1720 Dr. Emelda FearFerguson will call [facility] back.  1740  Dr. Emelda FearFerguson informed of above.  Patient can be OB cleared with instructions to hydrate and to call her OB with concerns and [facility].

## 2016-04-08 NOTE — ED Notes (Signed)
Pt in c/o abdominal cramping that started a few hours ago, pt is [redacted] weeks pregnant, G2 P1, reports normal fetal movement, denies vaginal discharge, denies n/v, pain is to top of stomach

## 2016-04-08 NOTE — ED Notes (Signed)
Pt ambulatory to bathroom without any problems.  Pt given PO fluids.

## 2016-04-08 NOTE — ED Provider Notes (Signed)
CSN: [id number]     Arrival date & time 04/08/16  1540 History   First MD Initiated Contact with Patient 04/08/16 1548     Chief Complaint  Patient presents with  . Abdominal Pain  . Pregnant      (Consider location/radiation/quality/duration/timing/severity/associated sxs/prior Treatment) Patient is a 30 y.o. female presenting with abdominal pain. The history is provided by the patient.  Abdominal Pain Pain location:  Epigastric Pain quality: cramping   Pain radiates to:  Does not radiate Pain severity:  Moderate Onset quality:  Gradual Duration:  1 day Timing:  Intermittent Progression:  Partially resolved Relieved by:  Nothing Worsened by:  Nothing tried Ineffective treatments:  None tried Associated symptoms: anorexia   Associated symptoms: no chest pain, no chills, no constipation, no cough, no diarrhea, no dysuria, no fever, no nausea, no shortness of breath, no vaginal discharge and no vomiting     Past Medical History  Diagnosis Date  . Asthma   . Headache(784.0)   . Depression   . Anxiety   . Narcotic abuse     history of   . ADHD (attention deficit hyperactivity disorder)   . Pregnant 10/31/2015  . Smoker 10/31/2015  . UTI (lower urinary tract infection) 12/06/2015   Past Surgical History  Procedure Laterality Date  . No past surgeries    . Cesarean section  12/16/2011    Procedure: CESAREAN SECTION;  Surgeon: Lazaro ArmsLuther H Eure, MD;  Location: [facility];  Service: Gynecology;  Laterality: N/A;  . Ovarian cyst drainage  2013   Family History  Problem Relation Age of Onset  . Heart attack Mother   . Diabetes Father   . Cancer Maternal Grandfather     lung   Social History  Substance Use Topics  . Smoking status: Current Every Day Smoker -- 0.50 packs/day for 10 years    Types: Cigarettes  . Smokeless tobacco: Never Used  . Alcohol Use: No   OB History    Gravida Para Term Preterm AB TAB SAB Ectopic Multiple Living   2 1 1  0 0 0 0 0 0 1     Review of  Systems  Constitutional: Negative for fever and chills.  Respiratory: Negative for cough and shortness of breath.   Cardiovascular: Negative for chest pain.  Gastrointestinal: Positive for abdominal pain and anorexia. Negative for nausea, vomiting, diarrhea and constipation.  Genitourinary: Negative for dysuria and vaginal discharge.      Allergies  Darvocet and Shellfish allergy  Home Medications   Prior to Admission medications   Medication Sig Start Date End Date Taking? Authorizing Provider  albuterol (PROAIR HFA) 108 (90 Base) MCG/ACT inhaler Inhale 2 puffs into the lungs 2 (two) times daily as needed for wheezing or shortness of breath. Patient not taking: Reported on 03/30/2016 01/28/16   Lazaro ArmsLuther H Eure, MD  ALPRAZolam Prudy Feeler(XANAX) 0.5 MG tablet TAKE 1 TABLET BY MOUTH THREE TIMES DAILY AS NEEDED. 03/30/16   Lazaro ArmsLuther H Eure, MD  escitalopram (LEXAPRO) 20 MG tablet Take 1 tablet (20 mg total) by mouth daily. 03/30/16   Lazaro ArmsLuther H Eure, MD  Fluticasone-Salmeterol (ADVAIR) 250-50 MCG/DOSE AEPB Inhale 1 puff into the lungs daily. For Asthma 01/28/16   Lazaro ArmsLuther H Eure, MD  HYDROcodone-acetaminophen (NORCO/VICODIN) 5-325 MG tablet Take 1 tablet by mouth every 6 (six) hours as needed. Patient not taking: Reported on 03/30/2016 02/04/16   Lazaro ArmsLuther H Eure, MD  Pediatric Multiple Vit-C-FA (FLINSTONES GUMMIES OMEGA-3 DHA) CHEW Chew by mouth. Takes 2 daily  Historical Provider, MD  promethazine (PHENERGAN) 25 MG tablet Take 1 tablet (25 mg total) by mouth every 6 (six) hours as needed for nausea or vomiting. Patient not taking: Reported on 03/30/2016 12/06/15   Adline PotterJennifer A Griffin, NP   BP 99/52 mmHg  Pulse 77  Temp(Src) 98.2 F (36.8 C) (Oral)  Resp 18  Ht 5\' 7"  (1.702 m)  Wt 64.411 kg  BMI 22.24 kg/m2  SpO2 99%  LMP 08/10/2015 Physical Exam  Constitutional: She is oriented to person, place, and time. She appears well-developed and well-nourished. No distress.  HENT:  Head: Normocephalic and atraumatic.   Eyes: Conjunctivae are normal.  Cardiovascular: Normal rate and normal heart sounds.   No murmur heard. Pulmonary/Chest: Effort normal and breath sounds normal. She has no wheezes. She has no rales.  Abdominal: Soft. There is no tenderness. There is no rebound and no guarding.  Gravid.   Musculoskeletal: She exhibits no edema.  Neurological: She is alert and oriented to person, place, and time.  Skin: Skin is warm. She is not diaphoretic.  Psychiatric: She has a normal mood and affect. Her behavior is normal.  Nursing note and vitals reviewed.   ED Course  Procedures (including critical care time) Labs Review Labs Reviewed - No data to display  Imaging Review No results found. I have personally reviewed and evaluated these images and lab results as part of my medical decision-making.   EKG Interpretation None      MDM   Final diagnoses:  Supervision of normal pregnancy, antepartum, third trimester  Epigastric abdominal pain   Patient 30 weeks 4 days pregnant presenting with intermittent 5 out of 10 epigastric abdominal pain as crampy in nature. She is not experiencing any vaginal bleeding, decreased fetal movemen, vaginal discharge, rush of fluids, or contractions.   Patient cleared by OB after reassuring strip with rare intermittent contractions. Abdominal exam benign with little concern for appendicitis or cholecystitis. Patient discharged home in good condition with plan for follow-up with OB as needed.   Levora AngelEric Naevia Unterreiner, MD 04/09/16 [id number]  Azalia BilisKevin Campos, MD 04/09/16 [phone]

## 2016-04-10 ENCOUNTER — Other Ambulatory Visit: Payer: Self-pay | Admitting: Obstetrics & Gynecology

## 2016-04-13 ENCOUNTER — Encounter: Payer: Medicaid Other | Admitting: Obstetrics & Gynecology

## 2016-04-14 ENCOUNTER — Encounter: Payer: Self-pay | Admitting: Obstetrics & Gynecology

## 2016-04-14 ENCOUNTER — Ambulatory Visit (INDEPENDENT_AMBULATORY_CARE_PROVIDER_SITE_OTHER): Payer: Medicaid Other | Admitting: Obstetrics & Gynecology

## 2016-04-14 VITALS — BP 98/60 | HR 94 | Wt 153.0 lb

## 2016-04-14 DIAGNOSIS — O360120 Maternal care for anti-D [Rh] antibodies, second trimester, not applicable or unspecified: Secondary | ICD-10-CM | POA: Diagnosis not present

## 2016-04-14 DIAGNOSIS — Z3483 Encounter for supervision of other normal pregnancy, third trimester: Secondary | ICD-10-CM

## 2016-04-14 DIAGNOSIS — Z331 Pregnant state, incidental: Secondary | ICD-10-CM

## 2016-04-14 DIAGNOSIS — Z1389 Encounter for screening for other disorder: Secondary | ICD-10-CM

## 2016-04-14 DIAGNOSIS — Z3A32 32 weeks gestation of pregnancy: Secondary | ICD-10-CM

## 2016-04-14 LAB — POCT URINALYSIS DIPSTICK
GLUCOSE UA: NEGATIVE
Ketones, UA: NEGATIVE
Leukocytes, UA: NEGATIVE
NITRITE UA: NEGATIVE
PROTEIN UA: NEGATIVE
RBC UA: NEGATIVE

## 2016-04-14 MED ORDER — RHO D IMMUNE GLOBULIN 1500 UNIT/2ML IJ SOSY
300.0000 ug | PREFILLED_SYRINGE | Freq: Once | INTRAMUSCULAR | Status: AC
  Administered 2016-04-14: 300 ug via INTRAMUSCULAR

## 2016-04-14 NOTE — Addendum Note (Signed)
Addended by: Federico FlakeNES, Juandaniel Manfredo A on: 04/14/2016 03:47 PM   Modules accepted: Orders

## 2016-04-14 NOTE — Progress Notes (Signed)
G2P1001 4349w3d Estimated Date of Delivery: 06/13/16  Blood pressure 98/60, pulse 94, weight 153 lb (69.4 kg), last menstrual period 08/10/2015.   BP weight and urine results all reviewed and noted.  Please refer to the obstetrical flow sheet for the fundal height and fetal heart rate documentation:  Patient reports good fetal movement, denies any bleeding and no rupture of membranes symptoms or regular contractions. Patient is without complaints. All questions were answered.  Orders Placed This Encounter  Procedures  . POCT urinalysis dipstick    Plan:  Continued routine obstetrical care, rhogam  No Follow-up on file.

## 2016-04-28 ENCOUNTER — Encounter: Payer: Self-pay | Admitting: Obstetrics & Gynecology

## 2016-04-28 ENCOUNTER — Encounter: Payer: Medicaid Other | Admitting: Obstetrics & Gynecology

## 2016-05-07 ENCOUNTER — Encounter: Payer: Self-pay | Admitting: Obstetrics & Gynecology

## 2016-05-07 ENCOUNTER — Ambulatory Visit (INDEPENDENT_AMBULATORY_CARE_PROVIDER_SITE_OTHER): Payer: Medicaid Other | Admitting: Obstetrics & Gynecology

## 2016-05-07 VITALS — BP 90/50 | HR 94 | Wt 158.0 lb

## 2016-05-07 DIAGNOSIS — Z3483 Encounter for supervision of other normal pregnancy, third trimester: Secondary | ICD-10-CM

## 2016-05-07 DIAGNOSIS — Z3A35 35 weeks gestation of pregnancy: Secondary | ICD-10-CM

## 2016-05-07 DIAGNOSIS — Z331 Pregnant state, incidental: Secondary | ICD-10-CM

## 2016-05-07 DIAGNOSIS — O34219 Maternal care for unspecified type scar from previous cesarean delivery: Secondary | ICD-10-CM

## 2016-05-07 DIAGNOSIS — Z1389 Encounter for screening for other disorder: Secondary | ICD-10-CM

## 2016-05-07 DIAGNOSIS — O99323 Drug use complicating pregnancy, third trimester: Secondary | ICD-10-CM

## 2016-05-07 LAB — POCT URINALYSIS DIPSTICK
Blood, UA: NEGATIVE
GLUCOSE UA: NEGATIVE
KETONES UA: NEGATIVE
LEUKOCYTES UA: NEGATIVE
NITRITE UA: NEGATIVE
Protein, UA: NEGATIVE

## 2016-05-07 NOTE — Progress Notes (Signed)
Pt denies any problems or concerns at this time.  

## 2016-05-07 NOTE — Progress Notes (Signed)
G2P1001 [redacted]w[redacted]d Estimated Date of Delivery: 06/13/16  Blood pressure (!) 90/50, pulse 94, weight 158 lb (71.7 kg), last menstrual period 08/10/2015.   BP weight and urine results all reviewed and noted.  Please refer to the obstetrical flow sheet for the fundal height and fetal heart rate documentation:  Patient reports good fetal movement, denies any bleeding and no rupture of membranes symptoms or regular contractions. Patient is without complaints. All questions were answered.  Orders Placed This Encounter  Procedures  . US OB Follow Up  . POCT urinalysis dipstick    Plan:  Continued routine obstetrical care, pt is planning to do a repeat Caesarean section 9/7 or 9/8  Return in about 1 week (around 05/14/2016) for sonogram EFW, LROB, with Dr Despina Hidden.

## 2016-05-15 ENCOUNTER — Ambulatory Visit (INDEPENDENT_AMBULATORY_CARE_PROVIDER_SITE_OTHER): Payer: Medicaid Other | Admitting: Obstetrics & Gynecology

## 2016-05-15 ENCOUNTER — Ambulatory Visit (INDEPENDENT_AMBULATORY_CARE_PROVIDER_SITE_OTHER): Payer: Medicaid Other

## 2016-05-15 ENCOUNTER — Encounter: Payer: Self-pay | Admitting: Obstetrics & Gynecology

## 2016-05-15 VITALS — BP 110/80 | HR 80 | Wt 157.0 lb

## 2016-05-15 DIAGNOSIS — O99323 Drug use complicating pregnancy, third trimester: Secondary | ICD-10-CM

## 2016-05-15 DIAGNOSIS — Z331 Pregnant state, incidental: Secondary | ICD-10-CM

## 2016-05-15 DIAGNOSIS — Z3A36 36 weeks gestation of pregnancy: Secondary | ICD-10-CM | POA: Diagnosis not present

## 2016-05-15 DIAGNOSIS — Z3483 Encounter for supervision of other normal pregnancy, third trimester: Secondary | ICD-10-CM

## 2016-05-15 DIAGNOSIS — O34219 Maternal care for unspecified type scar from previous cesarean delivery: Secondary | ICD-10-CM

## 2016-05-15 DIAGNOSIS — Z1389 Encounter for screening for other disorder: Secondary | ICD-10-CM

## 2016-05-15 MED ORDER — HYDROCODONE-ACETAMINOPHEN 5-325 MG PO TABS: 1.0000 | ORAL_TABLET | Freq: Four times a day (QID) | ORAL | 0 refills | Status: DC | PRN

## 2016-05-15 NOTE — Progress Notes (Signed)
G2P1001 5553w6d Estimated Date of Delivery: 06/13/16  Blood pressure 110/80, pulse 80, weight 157 lb (71.2 kg), last menstrual period 08/10/2015.   BP weight and urine results all reviewed and noted.  Please refer to the obstetrical flow sheet for the fundal height and fetal heart rate documentation:  Patient reports good fetal movement, denies any bleeding and no rupture of membranes symptoms or regular contractions. Patient is without complaints. All questions were answered.  No orders of the defined types were placed in this encounter.   Plan:  Continued routine obstetrical care, repeat section scheduled for 06/12/2016 @ 1:30 pm Dr Despina HiddenEure  Return in about 1 week (around 05/22/2016) for LROB, with Dr Despina HiddenEure.

## 2016-05-15 NOTE — Progress Notes (Signed)
US 35+6 wks,cephalic,post pl gr 2,normal ov's bilat,afi 11cm,fhr 153 bpm,efw 2609 g 34%

## 2016-05-21 ENCOUNTER — Ambulatory Visit (INDEPENDENT_AMBULATORY_CARE_PROVIDER_SITE_OTHER): Payer: Medicaid Other | Admitting: Obstetrics & Gynecology

## 2016-05-21 ENCOUNTER — Encounter: Payer: Self-pay | Admitting: Obstetrics & Gynecology

## 2016-05-21 VITALS — BP 102/60 | HR 76 | Wt 158.0 lb

## 2016-05-21 DIAGNOSIS — Z1159 Encounter for screening for other viral diseases: Secondary | ICD-10-CM

## 2016-05-21 DIAGNOSIS — Z118 Encounter for screening for other infectious and parasitic diseases: Secondary | ICD-10-CM

## 2016-05-21 DIAGNOSIS — Z3A37 37 weeks gestation of pregnancy: Secondary | ICD-10-CM

## 2016-05-21 DIAGNOSIS — O99323 Drug use complicating pregnancy, third trimester: Secondary | ICD-10-CM | POA: Diagnosis not present

## 2016-05-21 DIAGNOSIS — Z331 Pregnant state, incidental: Secondary | ICD-10-CM

## 2016-05-21 DIAGNOSIS — Z1389 Encounter for screening for other disorder: Secondary | ICD-10-CM

## 2016-05-21 DIAGNOSIS — O34219 Maternal care for unspecified type scar from previous cesarean delivery: Secondary | ICD-10-CM

## 2016-05-21 DIAGNOSIS — Z3483 Encounter for supervision of other normal pregnancy, third trimester: Secondary | ICD-10-CM

## 2016-05-21 DIAGNOSIS — Z3685 Encounter for antenatal screening for Streptococcus B: Secondary | ICD-10-CM

## 2016-05-21 DIAGNOSIS — Z0283 Encounter for blood-alcohol and blood-drug test: Secondary | ICD-10-CM

## 2016-05-21 MED ORDER — ALPRAZOLAM 0.5 MG PO TABS: 0.5000 mg | ORAL_TABLET | Freq: Three times a day (TID) | ORAL | 2 refills | Status: DC | PRN

## 2016-05-21 NOTE — Progress Notes (Signed)
G2P1001 1063w5d Estimated Date of Delivery: 06/13/16  Blood pressure 102/60, pulse 76, weight 158 lb (71.7 kg), last menstrual period 08/10/2015.   BP weight and urine results all reviewed and noted.  Please refer to the obstetrical flow sheet for the fundal height and fetal heart rate documentation:  Patient reports good fetal movement, denies any bleeding and no rupture of membranes symptoms or regular contractions. Patient is without complaints. All questions were answered.  Orders Placed This Encounter  Procedures  . Strep Gp B NAA  . GC/Chlamydia Probe Amp  . Urinalysis, Routine w reflex microscopic ([facility])  . Pain Management Screening Profile (10S)  . POCT urinalysis dipstick    Plan:  Continued routine obstetrical care, repeat Caesarean section 06/12/16@1330  [facility]  Return in about 1 week (around 05/28/2016) for LROB.  Meds ordered this encounter  Medications  . ALPRAZolam (XANAX) 0.5 MG tablet    Sig: Take 1 tablet (0.5 mg total) by mouth 3 (three) times daily as needed.    Dispense:  33 tablet    Refill:  2

## 2016-05-23 LAB — STREP GP B NAA: Strep Gp B NAA: NEGATIVE

## 2016-05-23 LAB — GC/CHLAMYDIA PROBE AMP
CHLAMYDIA, DNA PROBE: NEGATIVE
NEISSERIA GONORRHOEAE BY PCR: NEGATIVE

## 2016-05-25 ENCOUNTER — Observation Stay (HOSPITAL_COMMUNITY)
Admission: AD | Admit: 2016-05-25 | Discharge: 2016-05-26 | Disposition: A | Discharge status: Home / Self Care | Payer: Medicaid Other | Source: Ambulatory Visit | Attending: Family Medicine | Admitting: Family Medicine

## 2016-05-25 ENCOUNTER — Encounter (HOSPITAL_COMMUNITY): Payer: Self-pay | Admitting: *Deleted

## 2016-05-25 DIAGNOSIS — F17210 Nicotine dependence, cigarettes, uncomplicated: Secondary | ICD-10-CM | POA: Insufficient documentation

## 2016-05-25 DIAGNOSIS — O99335 Smoking (tobacco) complicating the puerperium: Secondary | ICD-10-CM | POA: Diagnosis not present

## 2016-05-25 LAB — CBC WITH DIFFERENTIAL/PLATELET
Basophils Absolute: 0 10*3/uL (ref 0.0–0.1)
Basophils Relative: 0 %
EOS PCT: 0 %
Eosinophils Absolute: 0.1 10*3/uL (ref 0.0–0.7)
HEMATOCRIT: 31.9 % — AB (ref 36.0–46.0)
Hemoglobin: 10.9 g/dL — ABNORMAL LOW (ref 12.0–15.0)
LYMPHS ABS: 1.8 10*3/uL (ref 0.7–4.0)
LYMPHS PCT: 8 %
MCH: 28.8 pg (ref 26.0–34.0)
MCHC: 34.2 g/dL (ref 30.0–36.0)
MCV: 84.2 fL (ref 78.0–100.0)
MONO ABS: 0.9 10*3/uL (ref 0.1–1.0)
MONOS PCT: 4 %
NEUTROS ABS: 19.8 10*3/uL — AB (ref 1.7–7.7)
Neutrophils Relative %: 88 %
Platelets: 338 10*3/uL (ref 150–400)
RBC: 3.79 MIL/uL — ABNORMAL LOW (ref 3.87–5.11)
RDW: 13.9 % (ref 11.5–15.5)
WBC: 22.5 10*3/uL — ABNORMAL HIGH (ref 4.0–10.5)

## 2016-05-25 LAB — RPR: RPR: NONREACTIVE

## 2016-05-25 LAB — RAPID URINE DRUG SCREEN, HOSP PERFORMED
Amphetamines: NOT DETECTED
Barbiturates: NOT DETECTED
Benzodiazepines: NOT DETECTED
COCAINE: NOT DETECTED
OPIATES: NOT DETECTED
Tetrahydrocannabinol: POSITIVE — AB

## 2016-05-25 MED ORDER — ZOLPIDEM TARTRATE 5 MG PO TABS: 5.0000 mg | ORAL_TABLET | Freq: Every evening | ORAL | Status: DC | PRN

## 2016-05-25 MED ORDER — SODIUM CHLORIDE 0.9 % IV SOLN: 250.0000 mL | INTRAVENOUS | Status: DC | PRN

## 2016-05-25 MED ORDER — SIMETHICONE 80 MG PO CHEW
80.0000 mg | CHEWABLE_TABLET | ORAL | Status: DC | PRN
  Filled 2016-05-25: qty 1

## 2016-05-25 MED ORDER — SODIUM CHLORIDE 0.9% FLUSH
3.0000 mL | Freq: Two times a day (BID) | INTRAVENOUS | Status: DC
  Administered 2016-05-25: 3 mL via INTRAVENOUS

## 2016-05-25 MED ORDER — DIPHENHYDRAMINE HCL 25 MG PO CAPS: 25.0000 mg | ORAL_CAPSULE | Freq: Four times a day (QID) | ORAL | Status: DC | PRN

## 2016-05-25 MED ORDER — RHO D IMMUNE GLOBULIN 1500 UNIT/2ML IJ SOSY
300.0000 ug | PREFILLED_SYRINGE | Freq: Once | INTRAMUSCULAR | Status: AC
  Administered 2016-05-25: 300 ug via INTRAVENOUS
  Filled 2016-05-25: qty 2

## 2016-05-25 MED ORDER — TETANUS-DIPHTH-ACELL PERTUSSIS 5-2.5-18.5 LF-MCG/0.5 IM SUSP
0.5000 mL | Freq: Once | INTRAMUSCULAR | Status: AC
  Administered 2016-05-26: 0.5 mL via INTRAMUSCULAR
  Filled 2016-05-25: qty 0.5

## 2016-05-25 MED ORDER — ESCITALOPRAM OXALATE 20 MG PO TABS
20.0000 mg | ORAL_TABLET | Freq: Every day | ORAL | Status: DC
  Administered 2016-05-25 – 2016-05-26 (×2): 20 mg via ORAL
  Filled 2016-05-25 (×2): qty 1

## 2016-05-25 MED ORDER — ACETAMINOPHEN 325 MG PO TABS
650.0000 mg | ORAL_TABLET | ORAL | Status: DC | PRN
  Administered 2016-05-25 – 2016-05-26 (×3): 650 mg via ORAL
  Filled 2016-05-25 (×3): qty 2

## 2016-05-25 MED ORDER — SENNOSIDES-DOCUSATE SODIUM 8.6-50 MG PO TABS
2.0000 | ORAL_TABLET | ORAL | Status: DC
  Administered 2016-05-26: 2 via ORAL
  Filled 2016-05-25: qty 2

## 2016-05-25 MED ORDER — ALPRAZOLAM 0.5 MG PO TABS
0.5000 mg | ORAL_TABLET | Freq: Three times a day (TID) | ORAL | Status: DC
  Administered 2016-05-25 – 2016-05-26 (×3): 0.5 mg via ORAL
  Filled 2016-05-25 (×3): qty 1

## 2016-05-25 MED ORDER — ONDANSETRON HCL 4 MG/2ML IJ SOLN: 4.0000 mg | INTRAMUSCULAR | Status: DC | PRN

## 2016-05-25 MED ORDER — BENZOCAINE-MENTHOL 20-0.5 % EX AERO
1.0000 "application " | INHALATION_SPRAY | CUTANEOUS | Status: DC | PRN
  Administered 2016-05-25: 1 via TOPICAL
  Filled 2016-05-25 (×2): qty 56

## 2016-05-25 MED ORDER — SODIUM CHLORIDE 0.9% FLUSH: 3.0000 mL | INTRAVENOUS | Status: DC | PRN

## 2016-05-25 MED ORDER — COCONUT OIL OIL
1.0000 "application " | TOPICAL_OIL | Status: DC | PRN
  Filled 2016-05-25: qty 120

## 2016-05-25 MED ORDER — PNEUMOCOCCAL VAC POLYVALENT 25 MCG/0.5ML IJ INJ
0.5000 mL | INJECTION | INTRAMUSCULAR | Status: DC
  Filled 2016-05-25: qty 0.5

## 2016-05-25 MED ORDER — PRENATAL MULTIVITAMIN CH
1.0000 | ORAL_TABLET | Freq: Every day | ORAL | Status: DC
  Filled 2016-05-25 (×2): qty 1

## 2016-05-25 MED ORDER — MEASLES, MUMPS & RUBELLA VAC ~~LOC~~ INJ
0.5000 mL | INJECTION | Freq: Once | SUBCUTANEOUS | Status: DC
  Filled 2016-05-25: qty 0.5

## 2016-05-25 MED ORDER — ONDANSETRON HCL 4 MG PO TABS: 4.0000 mg | ORAL_TABLET | ORAL | Status: DC | PRN

## 2016-05-25 MED ORDER — ALPRAZOLAM 0.5 MG PO TABS
0.5000 mg | ORAL_TABLET | Freq: Once | ORAL | Status: AC
  Administered 2016-05-25: 0.5 mg via ORAL
  Filled 2016-05-25: qty 1

## 2016-05-25 MED ORDER — IBUPROFEN 600 MG PO TABS
600.0000 mg | ORAL_TABLET | Freq: Four times a day (QID) | ORAL | Status: DC
  Administered 2016-05-25 – 2016-05-26 (×5): 600 mg via ORAL
  Filled 2016-05-25 (×5): qty 1

## 2016-05-25 NOTE — Progress Notes (Signed)
UR chart review completed.  

## 2016-05-25 NOTE — Progress Notes (Signed)
Urine specimen of clear yellow urine without any blood contamination collected by pt sent to lab for UDS.

## 2016-05-25 NOTE — H&P (Signed)
Brittany Cortez is a 30 y.o. female presenting for SVD of female infant at home. Pt is prev LTCS. Pt and inant in per EMS OB History    Gravida Para Term Preterm AB Living   2 1 1  0 0 1   SAB TAB Ectopic Multiple Live Births   0 0 0 0 1     Past Medical History:  Diagnosis Date  . ADHD (attention deficit hyperactivity disorder)   . Anxiety   . Asthma   . Depression   . Headache(784.0)   . Narcotic abuse    history of   . Pregnant 10/31/2015  . Smoker 10/31/2015  . UTI (lower urinary tract infection) 12/06/2015   Past Surgical History:  Procedure Laterality Date  . CESAREAN SECTION  12/16/2011   Procedure: CESAREAN SECTION;  Surgeon: Lazaro ArmsLuther H Eure, MD;  Location: [facility];  Service: Gynecology;  Laterality: N/A;  . NO PAST SURGERIES    . OVARIAN CYST DRAINAGE  2013   Family History: family history includes Cancer in her maternal grandfather; Diabetes in her father; Heart attack in her mother. Social History:  reports that she has been smoking Cigarettes.  She has a 5.00 pack-year smoking history. She has never used smokeless tobacco. She reports that she does not drink alcohol or use drugs.     Maternal Diabetes: No Genetic Screening: Normal Maternal Ultrasounds/Referrals: Normal Fetal Ultrasounds or other Referrals:  None Maternal Substance Abuse:  Yes:  Type: Smoker, Prescription drugs Significant Maternal Medications:  None Significant Maternal Lab Results:  None Other Comments:  None  Review of Systems  Constitutional: Negative.   HENT: Negative.   Eyes: Negative.   Respiratory: Negative.   Cardiovascular: Negative.   Gastrointestinal: Positive for abdominal pain.  Genitourinary: Negative.   Musculoskeletal: Negative.   Skin: Negative.   Neurological: Negative.   Endo/Heme/Allergies: Negative.   Psychiatric/Behavioral: Negative.    Maternal Medical History:  Reason for admission: Vaginal delivery at home of female infant  Contractions: Onset was 13-24 hours ago.    Frequency: regular.    Fetal activity: Perceived fetal activity is normal.   Last perceived fetal movement was within the past 12 hours.    Prenatal complications: Substance abuse.   Prenatal Complications - Diabetes: none.      Last menstrual period 08/10/2015. Maternal Exam:  Abdomen: Surgical scars: low transverse.   Introitus: Normal vulva. Normal vagina.  Amniotic fluid character: meconium stained.     Physical Exam  Constitutional: She is oriented to person, place, and time. She appears well-developed and well-nourished.  HENT:  Head: Normocephalic.  Eyes: Pupils are equal, round, and reactive to light.  Cardiovascular: Normal rate, regular rhythm, normal heart sounds and intact distal pulses.   Respiratory: Effort normal and breath sounds normal.  GI: Soft. Bowel sounds are normal.  Genitourinary: Vagina normal and uterus normal.  Musculoskeletal: Normal range of motion.  Neurological: She is alert and oriented to person, place, and time. She has normal reflexes.  Skin: Skin is warm and dry.  Psychiatric: She has a normal mood and affect. Her behavior is normal. Judgment and thought content normal.    Prenatal labs: ABO, Rh: O/Negative/-- (03/03 1116) Antibody: Negative (06/26 0908) Rubella: <0.90 (03/03 1116) RPR: Non Reactive (06/26 0908)  HBsAg: Negative (03/03 1116)  HIV: Non Reactive (06/26 0908)  GBS: Negative (08/17 1330)   Assessment/Plan: SVD female infant at home. Placenta delivered in route and complete with inspection. Perineum intact with only several small labial  abrasions that need no repair. Will admit to post partum.   Brittany Cortez 05/25/2016, 1:30 AM

## 2016-05-25 NOTE — Progress Notes (Signed)
Mom left floor "to walk and get fresh air" . Mom is a smoker.

## 2016-05-25 NOTE — MAU Note (Signed)
Pt delivered at home at 0022 baby live female . EMS personal present. Meconium stained fluid.V/SS.Plaxcenta delivered. Smaal amount of vag bleeding noted at this time.

## 2016-05-25 NOTE — Plan of Care (Signed)
Problem: Coping: Goal: Ability to cope will improve Outcome: Progressing NICU baby  Problem: Role Relationship: Goal: Ability to demonstrate positive interaction with newborn will improve Outcome: Progressing NICU baby

## 2016-05-25 NOTE — Progress Notes (Signed)
Mom returned to floor / room. Teary-eyed about newborn being transferred to NICU. Emotional support offered by RN.

## 2016-05-25 NOTE — Progress Notes (Signed)
Pt up to bathroom to change pad. Denies need to void but hasn't voided since arrival to hospital. While sitting on toilet, pt started voiding and RN handed her specimen cup for urine specimen. Pt stopped voiding and stated she forgot but would probably need to void again soon. Specimen cup in bathroom and pt aware of need for specimen.

## 2016-05-25 NOTE — Progress Notes (Signed)
Pt admitted to room 111 at 0230. Pt has been out of room to nursery and outside multiple times since then.

## 2016-05-26 LAB — RH IG WORKUP (INCLUDES ABO/RH)
ABO/RH(D): O NEG
ANTIBODY SCREEN: POSITIVE
DAT, IgG: NEGATIVE
Fetal Screen: NEGATIVE
Gestational Age(Wks): 37.2
Unit division: 0

## 2016-05-26 NOTE — Progress Notes (Signed)
CSW spoke with MOB via telephone.  CSW scheduled to meet with MOB on 05/27/16 to complete an assessment and to share with MOB the hospital's policy and procedures regarding infant's positive UDS and Cord screens.  Brittany Cortez, MSW, LCSW Clinical Social Work [phone]  

## 2016-05-26 NOTE — Discharge Instructions (Signed)

## 2016-05-26 NOTE — Discharge Summary (Signed)
OB Discharge Summary     Patient Name: Brittany Cortez DOB: 1986/05/21 MRN: [id number]  Date of admission: 05/25/2016 Delivering MD:     Date of discharge: 05/26/2016  Admitting diagnosis: DELIVERED BABY AT HOME Intrauterine pregnancy: 2360w2d     Secondary diagnosis:  Active Problems:   Postpartum care and examination  Additional problems: VBAC delivery at home w/EMS, h/o narcotic and benzodiazepine use     Discharge diagnosis: Term Pregnancy Delivered                                                                                                Post partum procedures:none  Augmentation: none  Complications: None  Hospital course:  Onset of Labor With Vaginal Delivery at home w/EMS     30 y.o. yo G2P2002 at 7160w2d was admitted as postpartum on 05/25/2016. Patient had a labor course at home d/t precipitous labor with EMS as follows:  Membrane Rupture Time/Date: 12:20 AM ,05/25/2016   Intrapartum Procedures: Episiotomy: None [1]                                         Lacerations:  None [1]  Patient had a delivery of a Viable infant. 05/25/2016  Information for the patient's newborn:  Heloise BeechamDavis, Boy Neeley [[id number]][[id number]]  Delivery Method: VBAC, Spontaneous (Filed from Delivery Summary)    Pateint had an uncomplicated postpartum course.  She is ambulating, tolerating a regular diet, passing flatus, and urinating well. Patient is discharged home in stable condition on 05/26/16.    Physical exam  Vitals:   05/25/16 0330 05/25/16 0833 05/25/16 2017 05/26/16 0645  BP: 111/75 111/74  111/73  Pulse: (!) 106 (!) 106  98  Resp: 20 20  18   Temp: 97.5 F (36.4 C)   98 F (36.7 C)  TempSrc: Oral   Axillary  Weight:   71.7 kg (158 lb)   Height:   5' 6.5" (1.689 m)    General: alert, cooperative and no distress Lochia: appropriate Uterine Fundus: firm DVT Evaluation: No evidence of DVT seen on physical exam. Negative Homan's sign. Labs: Lab Results  Component Value Date   WBC 22.5 (H)  05/25/2016   HGB 10.9 (L) 05/25/2016   HCT 31.9 (L) 05/25/2016   MCV 84.2 05/25/2016   PLT 338 05/25/2016   CMP Latest Ref Rng & Units 05/08/2015  Glucose 65 - 99 mg/dL 92  BUN 6 - 20 mg/dL 12  Creatinine 2.130.44 - 0.861.00 mg/dL 5.780.94  Sodium 469135 - 629145 mmol/L 140  Potassium 3.5 - 5.1 mmol/L 3.5  Chloride 101 - 111 mmol/L 109  CO2 22 - 32 mmol/L 25  Calcium 8.9 - 10.3 mg/dL 5.2(W8.4(L)  Total Protein 6.5 - 8.1 g/dL 4.1(L5.5(L)  Total Bilirubin 0.3 - 1.2 mg/dL 2.4(M1.3(H)  Alkaline Phos 38 - 126 U/L 64  AST 15 - 41 U/L 47(H)  ALT 14 - 54 U/L 51    Discharge instruction: per After Visit Summary and "Baby and Me Booklet".  After  visit meds:    Medication List    STOP taking these medications   calcium carbonate 500 MG chewable tablet Commonly known as:  TUMS - dosed in mg elemental calcium     TAKE these medications   albuterol 108 (90 Base) MCG/ACT inhaler Commonly known as:  PROAIR HFA Inhale 2 puffs into the lungs 2 (two) times daily as needed for wheezing or shortness of breath.   ALPRAZolam 0.5 MG tablet Commonly known as:  XANAX Take 1 tablet (0.5 mg total) by mouth 3 (three) times daily as needed. What changed:  reasons to take this   carisoprodol 350 MG tablet Commonly known as:  SOMA Take 350 mg by mouth daily as needed for muscle spasms.   escitalopram 20 MG tablet Commonly known as:  LEXAPRO Take 1 tablet (20 mg total) by mouth daily.   Fluticasone-Salmeterol 250-50 MCG/DOSE Aepb Commonly known as:  ADVAIR Inhale 1 puff into the lungs daily. For Asthma   HYDROcodone-acetaminophen 5-325 MG tablet Commonly known as:  NORCO/VICODIN Take 1 tablet by mouth every 6 (six) hours as needed. What changed:  reasons to take this   promethazine 25 MG tablet Commonly known as:  PHENERGAN Take 1 tablet (25 mg total) by mouth every 6 (six) hours as needed for nausea or vomiting.       Diet: routine diet  Activity: Advance as tolerated. Pelvic rest for 6 weeks.   Outpatient  follow up:6 weeks Follow up Appt:No future appointments. Follow up Visit: Follow-up Information    [facility]. Schedule an appointment as soon as possible for a visit in 6 week(s).   Specialty:  Obstetrics and Gynecology Why:  postpartum visit Contact information: [address] [place] [id number] [phone]       [facility] .   Why:  as needed for potential emergencies Contact information: [address] [place] [phone]          Postpartum contraception: Undecided  Newborn Data: Live born female  Birth Weight: 5 lb 14.5 oz (2679 g) APGAR: ,   Baby Feeding: Breast Disposition:NICU    05/26/2016 Leland HerElsia J Yoo, DO PGY-1    OB FELLOW DISCHARGE ATTESTATION  I have seen and examined this patient and agree with above documentation in the resident's note.   Jen MowElizabeth Mumaw, DO OB Fellow 9:04 AM

## 2016-05-26 NOTE — Progress Notes (Signed)
Spoke with Dr Artist PaisYoo about possible need for social work consult. Dr Artist PaisYoo states that social work consult is not needed, baby is in NICU and SS is involved in care for baby. Sherald BargeMatthews, Ludmilla Mcgillis L
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
## 2016-05-29 ENCOUNTER — Encounter: Payer: Medicaid Other | Admitting: Obstetrics & Gynecology

## 2016-05-30 NOTE — Progress Notes (Signed)
CSW received a copy of cord tissue test from NP. This Clinical research associatewriter contacted [facility] on call worker [name] and informed her of the results to include new positive drug's  that were previously not know when initial report was made.   [name] stated she would provide received information to the worker assigned to this case. No other needs addressed or requested. CSW will continue to follow should any additional needs arise.   Brittany Cortez, MSW, LCSW-A Clinical Social Worker  [facility]  Office: [phone]

## 2016-06-10 ENCOUNTER — Encounter: Payer: Self-pay | Admitting: *Deleted

## 2016-06-11 ENCOUNTER — Encounter (HOSPITAL_COMMUNITY): Payer: Self-pay | Admitting: Certified Registered Nurse Anesthetist

## 2016-06-12 ENCOUNTER — Encounter (HOSPITAL_COMMUNITY): Admission: RE | Payer: Self-pay | Source: Ambulatory Visit

## 2016-06-12 ENCOUNTER — Inpatient Hospital Stay (HOSPITAL_COMMUNITY)
Admission: RE | Admit: 2016-06-12 | Payer: Medicaid Other | Source: Ambulatory Visit | Admitting: Obstetrics & Gynecology

## 2016-06-12 SURGERY — Surgical Case
Anesthesia: Regional

## 2016-07-14 ENCOUNTER — Ambulatory Visit: Payer: Medicaid Other | Admitting: Obstetrics & Gynecology

## 2018-10-05 NOTE — L&D Delivery Note (Addendum)
Patient: Brittany Cortez MRN: [id number]  GBS status: neg, IAP givenNA  Patient is a 33 y.o. now G3P3003 s/p NSVD at [redacted]w[redacted]d, who was admitted for IOL for NR NST. SROM 4h 87m prior to delivery with clear fluid.    Delivery Note At 5:09 AM a viable and healthy female was delivered via Vaginal, Spontaneous (Presentation:vertex;LOA).  APGAR: 9, 9; weight pending.   Placenta status: delivered spontaneously,intact.  Cord: 3-vessel with the following complications: none.  Cord pH: pending  Anesthesia:  epidural Episiotomy: None Lacerations: none Suture Repair: none Est. Blood Loss (mL): 113  Mom to postpartum.  Baby to Couplet care / Skin to Skin.  Chelsey L Anderson 08/31/2019, 6:08 AM    Head delivered LOA. No nuchal cord present. Shoulder delivered by clinician and then mother assisted with delivery remainder of body per her prior request. Infant with spontaneous cry, placed on mother's abdomen, dried and bulb suctioned. Cord clamped x 2 after 1-minute delay, and cut by family member. Cord blood drawn. Placenta delivered spontaneously with gentle cord traction. Fundus firm with massage and Pitocin. Perineum inspected and found to have no laceration and to be hemostatic. Patient received pp IUD placement (see separate clinical procedure note for details).   OB FELLOW ATTESTATION  I was present and gloved throughout delivery and agree with the above.   Augustin Coupe, MD/[facility] OB Fellow  08/31/2019, 8:36 AM

## 2019-02-03 ENCOUNTER — Other Ambulatory Visit: Payer: Self-pay

## 2019-02-03 ENCOUNTER — Ambulatory Visit (INDEPENDENT_AMBULATORY_CARE_PROVIDER_SITE_OTHER): Payer: Medicaid Other | Admitting: Obstetrics & Gynecology

## 2019-02-03 ENCOUNTER — Other Ambulatory Visit (HOSPITAL_COMMUNITY)
Admission: RE | Admit: 2019-02-03 | Discharge: 2019-02-03 | Disposition: A | Discharge status: Home / Self Care | Payer: Medicaid Other | Source: Ambulatory Visit | Attending: Obstetrics & Gynecology | Admitting: Obstetrics & Gynecology

## 2019-02-03 ENCOUNTER — Encounter: Payer: Self-pay | Admitting: Obstetrics & Gynecology

## 2019-02-03 VITALS — BP 99/64 | HR 103 | Temp 98.5°F | Wt 199.2 lb

## 2019-02-03 DIAGNOSIS — O99321 Drug use complicating pregnancy, first trimester: Secondary | ICD-10-CM

## 2019-02-03 DIAGNOSIS — Z3481 Encounter for supervision of other normal pregnancy, first trimester: Secondary | ICD-10-CM | POA: Diagnosis not present

## 2019-02-03 DIAGNOSIS — Z3480 Encounter for supervision of other normal pregnancy, unspecified trimester: Secondary | ICD-10-CM | POA: Insufficient documentation

## 2019-02-03 DIAGNOSIS — O99511 Diseases of the respiratory system complicating pregnancy, first trimester: Secondary | ICD-10-CM

## 2019-02-03 DIAGNOSIS — J45909 Unspecified asthma, uncomplicated: Secondary | ICD-10-CM

## 2019-02-03 DIAGNOSIS — Z3A11 11 weeks gestation of pregnancy: Secondary | ICD-10-CM

## 2019-02-03 DIAGNOSIS — F1129 Opioid dependence with unspecified opioid-induced disorder: Secondary | ICD-10-CM

## 2019-02-03 MED ORDER — FLUTICASONE-SALMETEROL 100-50 MCG/DOSE IN AEPB: 1.0000 | INHALATION_SPRAY | Freq: Every morning | RESPIRATORY_TRACT | 4 refills | Status: DC

## 2019-02-03 MED ORDER — ALBUTEROL SULFATE HFA 108 (90 BASE) MCG/ACT IN AERS: 2.0000 | INHALATION_SPRAY | Freq: Two times a day (BID) | RESPIRATORY_TRACT | 11 refills | Status: DC | PRN

## 2019-02-03 MED ORDER — FLUTICASONE-SALMETEROL 250-50 MCG/DOSE IN AEPB: 1.0000 | INHALATION_SPRAY | Freq: Every day | RESPIRATORY_TRACT | 11 refills | Status: DC

## 2019-02-03 NOTE — Patient Instructions (Signed)
First Trimester of Pregnancy  The first trimester of pregnancy is from week 1 until the end of week 13 (months 1 through 3). A week after a sperm fertilizes an egg, the egg will implant on the wall of the uterus. This embryo will begin to develop into a baby. Genes from you and your partner will form the baby. The female genes will determine whether the baby will be a boy or a girl. At 6-8 weeks, the eyes and face will be formed, and the heartbeat can be seen on ultrasound. At the end of 12 weeks, all the baby's organs will be formed.  Now that you are pregnant, you will want to do everything you can to have a healthy baby. Two of the most important things are to get good prenatal care and to follow your health care provider's instructions. Prenatal care is all the medical care you receive before the baby's birth. This care will help prevent, find, and treat any problems during the pregnancy and childbirth.  Body changes during your first trimester  Your body goes through many changes during pregnancy. The changes vary from woman to woman.   You may gain or lose a couple of pounds at first.   You may feel sick to your stomach (nauseous) and you may throw up (vomit). If the vomiting is uncontrollable, call your health care provider.   You may tire easily.   You may develop headaches that can be relieved by medicines. All medicines should be approved by your health care provider.   You may urinate more often. Painful urination may mean you have a bladder infection.   You may develop heartburn as a result of your pregnancy.   You may develop constipation because certain hormones are causing the muscles that push stool through your intestines to slow down.   You may develop hemorrhoids or swollen veins (varicose veins).   Your breasts may begin to grow larger and become tender. Your nipples may stick out more, and the tissue that surrounds them (areola) may become darker.   Your gums may bleed and may be  sensitive to brushing and flossing.   Dark spots or blotches (chloasma, mask of pregnancy) may develop on your face. This will likely fade after the baby is born.   Your menstrual periods will stop.   You may have a loss of appetite.   You may develop cravings for certain kinds of food.   You may have changes in your emotions from day to day, such as being excited to be pregnant or being concerned that something may go wrong with the pregnancy and baby.   You may have more vivid and strange dreams.   You may have changes in your hair. These can include thickening of your hair, rapid growth, and changes in texture. Some women also have hair loss during or after pregnancy, or hair that feels dry or thin. Your hair will most likely return to normal after your baby is born.  What to expect at prenatal visits  During a routine prenatal visit:   You will be weighed to make sure you and the baby are growing normally.   Your blood pressure will be taken.   Your abdomen will be measured to track your baby's growth.   The fetal heartbeat will be listened to between weeks 10 and 14 of your pregnancy.   Test results from any previous visits will be discussed.  Your health care provider may ask you:     How you are feeling.   If you are feeling the baby move.   If you have had any abnormal symptoms, such as leaking fluid, bleeding, severe headaches, or abdominal cramping.   If you are using any tobacco products, including cigarettes, chewing tobacco, and electronic cigarettes.   If you have any questions.  Other tests that may be performed during your first trimester include:   Blood tests to find your blood type and to check for the presence of any previous infections. The tests will also be used to check for low iron levels (anemia) and protein on red blood cells (Rh antibodies). Depending on your risk factors, or if you previously had diabetes during pregnancy, you may have tests to check for high blood sugar  that affects pregnant women (gestational diabetes).   Urine tests to check for infections, diabetes, or protein in the urine.   An ultrasound to confirm the proper growth and development of the baby.   Fetal screens for spinal cord problems (spina bifida) and Down syndrome.   HIV (human immunodeficiency virus) testing. Routine prenatal testing includes screening for HIV, unless you choose not to have this test.   You may need other tests to make sure you and the baby are doing well.  Follow these instructions at home:  Medicines   Follow your health care provider's instructions regarding medicine use. Specific medicines may be either safe or unsafe to take during pregnancy.   Take a prenatal vitamin that contains at least 600 micrograms (mcg) of folic acid.   If you develop constipation, try taking a stool softener if your health care provider approves.  Eating and drinking     Eat a balanced diet that includes fresh fruits and vegetables, whole grains, good sources of protein such as meat, eggs, or tofu, and low-fat dairy. Your health care provider will help you determine the amount of weight gain that is right for you.   Avoid raw meat and uncooked cheese. These carry germs that can cause birth defects in the baby.   Eating four or five small meals rather than three large meals a day may help relieve nausea and vomiting. If you start to feel nauseous, eating a few soda crackers can be helpful. Drinking liquids between meals, instead of during meals, also seems to help ease nausea and vomiting.   Limit foods that are high in fat and processed sugars, such as fried and sweet foods.   To prevent constipation:  ? Eat foods that are high in fiber, such as fresh fruits and vegetables, whole grains, and beans.  ? Drink enough fluid to keep your urine clear or pale yellow.  Activity   Exercise only as directed by your health care provider. Most women can continue their usual exercise routine during  pregnancy. Try to exercise for 30 minutes at least 5 days a week. Exercising will help you:  ? Control your weight.  ? Stay in shape.  ? Be prepared for labor and delivery.   Experiencing pain or cramping in the lower abdomen or lower back is a good sign that you should stop exercising. Check with your health care provider before continuing with normal exercises.   Try to avoid standing for long periods of time. Move your legs often if you must stand in one place for a long time.   Avoid heavy lifting.   Wear low-heeled shoes and practice good posture.   You may continue to have sex unless your health care   provider tells you not to.  Relieving pain and discomfort   Wear a good support bra to relieve breast tenderness.   Take warm sitz baths to soothe any pain or discomfort caused by hemorrhoids. Use hemorrhoid cream if your health care provider approves.   Rest with your legs elevated if you have leg cramps or low back pain.   If you develop varicose veins in your legs, wear support hose. Elevate your feet for 15 minutes, 3-4 times a day. Limit salt in your diet.  Prenatal care   Schedule your prenatal visits by the twelfth week of pregnancy. They are usually scheduled monthly at first, then more often in the last 2 months before delivery.   Write down your questions. Take them to your prenatal visits.   Keep all your prenatal visits as told by your health care provider. This is important.  Safety   Wear your seat belt at all times when driving.   Make a list of emergency phone numbers, including numbers for family, friends, the hospital, and police and fire departments.  General instructions   Ask your health care provider for a referral to a local prenatal education class. Begin classes no later than the beginning of month 6 of your pregnancy.   Ask for help if you have counseling or nutritional needs during pregnancy. Your health care provider can offer advice or refer you to specialists for help  with various needs.   Do not use hot tubs, steam rooms, or saunas.   Do not douche or use tampons or scented sanitary pads.   Do not cross your legs for long periods of time.   Avoid cat litter boxes and soil used by cats. These carry germs that can cause birth defects in the baby and possibly loss of the fetus by miscarriage or stillbirth.   Avoid all smoking, herbs, alcohol, and medicines not prescribed by your health care provider. Chemicals in these products affect the formation and growth of the baby.   Do not use any products that contain nicotine or tobacco, such as cigarettes and e-cigarettes. If you need help quitting, ask your health care provider. You may receive counseling support and other resources to help you quit.   Schedule a dentist appointment. At home, brush your teeth with a soft toothbrush and be gentle when you floss.  Contact a health care provider if:   You have dizziness.   You have mild pelvic cramps, pelvic pressure, or nagging pain in the abdominal area.   You have persistent nausea, vomiting, or diarrhea.   You have a bad smelling vaginal discharge.   You have pain when you urinate.   You notice increased swelling in your face, hands, legs, or ankles.   You are exposed to fifth disease or chickenpox.   You are exposed to German measles (rubella) and have never had it.  Get help right away if:   You have a fever.   You are leaking fluid from your vagina.   You have spotting or bleeding from your vagina.   You have severe abdominal cramping or pain.   You have rapid weight gain or loss.   You vomit blood or material that looks like coffee grounds.   You develop a severe headache.   You have shortness of breath.   You have any kind of trauma, such as from a fall or a car accident.  Summary   The first trimester of pregnancy is from week 1 until   the end of week 13 (months 1 through 3).   Your body goes through many changes during pregnancy. The changes vary from  woman to woman.   You will have routine prenatal visits. During those visits, your health care provider will examine you, discuss any test results you may have, and talk with you about how you are feeling.  This information is not intended to replace advice given to you by your health care provider. Make sure you discuss any questions you have with your health care provider.  Document Released: 09/15/2001 Document Revised: 09/02/2016 Document Reviewed: 09/02/2016  Elsevier Interactive Patient Education  2019 Elsevier Inc.
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
## 2019-02-03 NOTE — Progress Notes (Signed)
Patient is in the office for NOB, denies pain today.

## 2019-02-04 LAB — OBSTETRIC PANEL, INCLUDING HIV
Antibody Screen: NEGATIVE
Basophils Absolute: 0.1 10*3/uL (ref 0.0–0.2)
Basos: 1 %
EOS (ABSOLUTE): 0.4 10*3/uL (ref 0.0–0.4)
Eos: 5 %
HIV Screen 4th Generation wRfx: NONREACTIVE
Hematocrit: 39.8 % (ref 34.0–46.6)
Hemoglobin: 13.6 g/dL (ref 11.1–15.9)
Hepatitis B Surface Ag: NEGATIVE
Immature Grans (Abs): 0 10*3/uL (ref 0.0–0.1)
Immature Granulocytes: 0 %
Lymphocytes Absolute: 2.9 10*3/uL (ref 0.7–3.1)
Lymphs: 33 %
MCH: 29.7 pg (ref 26.6–33.0)
MCHC: 34.2 g/dL (ref 31.5–35.7)
MCV: 87 fL (ref 79–97)
Monocytes Absolute: 0.6 10*3/uL (ref 0.1–0.9)
Monocytes: 7 %
Neutrophils Absolute: 4.8 10*3/uL (ref 1.4–7.0)
Neutrophils: 54 %
Platelets: 338 10*3/uL (ref 150–450)
RBC: 4.58 x10E6/uL (ref 3.77–5.28)
RDW: 12.9 % (ref 11.7–15.4)
RPR Ser Ql: NONREACTIVE
Rh Factor: NEGATIVE
Rubella Antibodies, IGG: <0.9 index — ABNORMAL LOW (ref >0.99)
WBC: 8.8 10*3/uL (ref 3.4–10.8)

## 2019-02-05 LAB — URINE CULTURE, OB REFLEX

## 2019-02-05 LAB — CULTURE, OB URINE

## 2019-02-07 LAB — CERVICOVAGINAL ANCILLARY ONLY
Bacterial vaginitis: NEGATIVE
Candida vaginitis: NEGATIVE
Chlamydia: NEGATIVE
Neisseria Gonorrhea: NEGATIVE
Trichomonas: NEGATIVE

## 2019-02-08 LAB — CYTOLOGY - PAP
Diagnosis: NEGATIVE
HPV: NOT DETECTED

## 2019-02-13 ENCOUNTER — Encounter: Payer: Self-pay | Admitting: Obstetrics & Gynecology

## 2019-02-13 DIAGNOSIS — Z3480 Encounter for supervision of other normal pregnancy, unspecified trimester: Secondary | ICD-10-CM

## 2019-03-24 ENCOUNTER — Ambulatory Visit (HOSPITAL_COMMUNITY): Payer: Medicaid Other | Admitting: *Deleted

## 2019-03-24 ENCOUNTER — Encounter (HOSPITAL_COMMUNITY): Payer: Self-pay

## 2019-03-24 ENCOUNTER — Ambulatory Visit (HOSPITAL_COMMUNITY)
Admission: RE | Admit: 2019-03-24 | Discharge: 2019-03-24 | Disposition: A | Discharge status: Home / Self Care | Payer: Medicaid Other | Source: Ambulatory Visit | Attending: Obstetrics and Gynecology | Admitting: Obstetrics and Gynecology

## 2019-03-24 ENCOUNTER — Other Ambulatory Visit: Payer: Self-pay

## 2019-03-24 VITALS — BP 112/74 | HR 96 | Temp 98.1°F

## 2019-03-24 DIAGNOSIS — O99322 Drug use complicating pregnancy, second trimester: Secondary | ICD-10-CM

## 2019-03-24 DIAGNOSIS — F1120 Opioid dependence, uncomplicated: Secondary | ICD-10-CM

## 2019-03-24 DIAGNOSIS — O99332 Smoking (tobacco) complicating pregnancy, second trimester: Secondary | ICD-10-CM | POA: Diagnosis not present

## 2019-03-24 DIAGNOSIS — Z3A17 17 weeks gestation of pregnancy: Secondary | ICD-10-CM

## 2019-03-24 DIAGNOSIS — F1129 Opioid dependence with unspecified opioid-induced disorder: Secondary | ICD-10-CM | POA: Diagnosis present

## 2019-03-24 DIAGNOSIS — O99320 Drug use complicating pregnancy, unspecified trimester: Secondary | ICD-10-CM | POA: Diagnosis present

## 2019-03-24 DIAGNOSIS — Z3687 Encounter for antenatal screening for uncertain dates: Secondary | ICD-10-CM

## 2019-03-24 DIAGNOSIS — Z3480 Encounter for supervision of other normal pregnancy, unspecified trimester: Secondary | ICD-10-CM | POA: Diagnosis present

## 2019-03-24 DIAGNOSIS — Z363 Encounter for antenatal screening for malformations: Secondary | ICD-10-CM

## 2019-03-24 DIAGNOSIS — O34219 Maternal care for unspecified type scar from previous cesarean delivery: Secondary | ICD-10-CM | POA: Diagnosis not present

## 2019-03-27 ENCOUNTER — Other Ambulatory Visit (HOSPITAL_COMMUNITY): Payer: Self-pay | Admitting: *Deleted

## 2019-03-27 DIAGNOSIS — Z362 Encounter for other antenatal screening follow-up: Secondary | ICD-10-CM

## 2019-03-30 ENCOUNTER — Ambulatory Visit (INDEPENDENT_AMBULATORY_CARE_PROVIDER_SITE_OTHER): Payer: Medicaid Other | Admitting: Certified Nurse Midwife

## 2019-03-30 ENCOUNTER — Encounter: Payer: Self-pay | Admitting: Certified Nurse Midwife

## 2019-03-30 ENCOUNTER — Other Ambulatory Visit: Payer: Self-pay

## 2019-03-30 DIAGNOSIS — F17200 Nicotine dependence, unspecified, uncomplicated: Secondary | ICD-10-CM

## 2019-03-30 DIAGNOSIS — O34219 Maternal care for unspecified type scar from previous cesarean delivery: Secondary | ICD-10-CM

## 2019-03-30 DIAGNOSIS — Z3480 Encounter for supervision of other normal pregnancy, unspecified trimester: Secondary | ICD-10-CM

## 2019-03-30 DIAGNOSIS — Z3A18 18 weeks gestation of pregnancy: Secondary | ICD-10-CM

## 2019-03-30 DIAGNOSIS — F1120 Opioid dependence, uncomplicated: Secondary | ICD-10-CM

## 2019-03-30 DIAGNOSIS — O99320 Drug use complicating pregnancy, unspecified trimester: Secondary | ICD-10-CM

## 2019-03-30 MED ORDER — BLOOD PRESSURE MONITORING KIT: 1.0000 | PACK | 0 refills | Status: DC

## 2019-03-30 MED ORDER — ALBUTEROL SULFATE HFA 108 (90 BASE) MCG/ACT IN AERS: 2.0000 | INHALATION_SPRAY | Freq: Two times a day (BID) | RESPIRATORY_TRACT | 1 refills | Status: AC | PRN

## 2019-03-30 MED ORDER — BUPROPION HCL ER (XL) 150 MG PO TB24: 150.0000 mg | ORAL_TABLET | Freq: Every day | ORAL | 1 refills | Status: DC

## 2019-03-30 MED ORDER — FLUTICASONE-SALMETEROL 100-50 MCG/DOSE IN AEPB: 1.0000 | INHALATION_SPRAY | Freq: Every morning | RESPIRATORY_TRACT | 4 refills | Status: DC

## 2019-03-30 NOTE — Progress Notes (Signed)
ROB pt states she recently had U/S.   *Patient states she has not received B/P Cuff yet.  Pt has Medication concerns .

## 2019-03-30 NOTE — Progress Notes (Signed)
TELEHEALTH OBSTETRICS PRENATAL VIRTUAL VIDEO VISIT ENCOUNTER NOTE  Provider [facility]: [facility]   I connected with Brittany LeverKatie W Cortez on 03/30/19 at  2:07 PM EDT by WebEx Video Encounter at home and verified that I am speaking with the correct person using two identifiers.   I discussed the limitations, risks, security and privacy concerns of performing an evaluation and management service by telephone and the availability of in person appointments. I also discussed with the patient that there may be a patient responsible charge related to this service. The patient expressed understanding and agreed to proceed. Subjective:  Brittany Cortez is a 33 y.o. G3P2002 at 5563w1d being seen today for ongoing prenatal care.  She is currently monitored for the following issues for this high-risk pregnancy and has Asthma; Hx of narcotic abuse; Severe alcohol use disorder (HCC); Opioid dependence (HCC); ADHD (attention deficit hyperactivity disorder); Substance induced mood disorder (HCC); Smoker; Supervision of other normal pregnancy, antepartum; Pregnancy complicated by subutex maintenance, antepartum (HCC); and History of cesarean delivery, currently pregnant on their problem list.  Patient reports no complaints.  Contractions: Not present. Vag. Bleeding: None.  Movement: Present. Denies any leaking of fluid.   The following portions of the patient's history were reviewed and updated as appropriate: allergies, current medications, past family history, past medical history, past social history, past surgical history and problem list.   Objective:  There were no vitals filed for this visit.  Fetal Status:     Movement: Present     General:  Alert, oriented and cooperative. Patient is in no acute distress.  Respiratory: Normal respiratory effort, no problems with respiration noted  Mental Status: Normal mood and affect. Normal behavior. Normal judgment and thought content.  Rest of  physical exam deferred due to type of encounter  Imaging: [place] Mfm Ob Detail +14 Wk  Result Date: 03/24/2019 ----------------------------------------------------------------------  OBSTETRICS REPORT                       (Signed Final 03/24/2019 03:04 pm) ---------------------------------------------------------------------- Patient Info  ID #:       [id number]                          D.O.B.:  [birth year] (33 yrs)  Name:       Brittany Cortez                   Visit Date: 03/24/2019 02:47 pm ---------------------------------------------------------------------- Performed By  Performed By:     Eden Lathearrie Stalter BS      Ref. Address:     [address]                    [address]                                                             [address]  [address]                                                             [place]                                                             [phone]  Attending:        Lin Landsmanorenthian Booker      [facility]  [facility]:      [facility] ---------------------------------------------------------------------- Orders   #  Description                          Code         Ordered By   1  US MFM OB DETAIL +14 WK              76811.01     Scheryl DarterJAMES ARNOLD  ----------------------------------------------------------------------   #  Order #                    Accession #                 Episode #   1  [id number]                  [id number]                  [id number]  ---------------------------------------------------------------------- Indications   Antenatal screening for malformations          Z36.3   Encounter for uncertain dates                  Z36.87   Pregnancy comlicated by subutex                O99.320, F11.20,   maintenance, antepartum                        Z79.891   Smoking complicating pregnancy, second         N02.725O99.332   trimester    Previous cesarean delivery, antepartum         O34.219   [redacted] weeks gestation of pregnancy                Z3A.17  ---------------------------------------------------------------------- Fetal Evaluation  Num Of Fetuses:         1  Fetal Heart Rate(bpm):  129  Cardiac Activity:       Observed  Presentation:           Cephalic  Placenta:               Anterior  P.  Cord Insertion:      Visualized  Amniotic Fluid  AFI FV:      Within normal limits                              Largest Pocket(cm)                              4.83 ---------------------------------------------------------------------- Biometry  BPD:      38.5  mm     G. Age:  17w 5d         70  %    CI:        67.27   %    70 - 86                                                          FL/HC:      14.6   %    14.6 - 17.6  HC:      150.3  mm     G. Age:  18w 1d         80  %    HC/AC:      1.36        1.07 - 1.29  AC:      110.9  mm     G. Age:  16w 6d         37  %    FL/BPD:     57.1   %  FL:         22  mm     G. Age:  16w 4d         21  %    FL/AC:      19.8   %    20 - 24  HUM:      20.8  mm     G. Age:  16w 2d         27  %  CER:      16.6  mm     G. Age:  16w 4d         35  %  Est. FW:     174  gm      0 lb 6 oz     46  % ---------------------------------------------------------------------- Gestational Age  U/S Today:     17w 2d                                        EDD:   08/30/19  Best:          17w 2d     Det. By:  U/S (03/24/19)           EDD:   08/30/19 ---------------------------------------------------------------------- Anatomy  Cranium:               Appears normal         LVOT:                   Appears normal  Cavum:                 Not well visualized  Aortic Arch:            Appears normal  Ventricles:            Appears normal         Ductal Arch:            Not well visualized  Choroid Plexus:        Appears normal         Diaphragm:              Not well visualized  Cerebellum:            Not well visualized    Stomach:                 Appears normal, left                                                                        sided  Posterior Fossa:       Not well visualized    Abdomen:                Appears normal  Nuchal Fold:           Not well visualized    Abdominal Wall:         Not well visualized  Face:                  Appears normal         Cord Vessels:           Appears normal (3                         (orbits and profile)                           vessel cord)  Lips:                  Not well visualized    Kidneys:                Appear normal  Palate:                Not well visualized    Bladder:                Appears normal  Thoracic:              Appears normal         Spine:                  Not well visualized  Heart:                 Not well visualized    Upper Extremities:      Visualized  RVOT:                  Not well visualized    Lower Extremities:      Visualized  Other:  Heels visualized. Nasal bone visualized. Technically difficult due to          early gestational age. ---------------------------------------------------------------------- Cervix Uterus Adnexa  Cervix  Length:  3.5  cm.  Normal appearance by transabdominal scan.  Uterus  No abnormality visualized.  Left Ovary  Within normal limits.  Right Ovary  Within normal limits.  Cul De Sac  No free fluid seen.  Adnexa  No abnormality visualized. ---------------------------------------------------------------------- Impression  Normal interval growth.  No ultrasonic evidence of structural  fetal anomalies.  Uncertain LMP therefore dated by today's examination.  Suboptimal views of the fetal anatomy was obtained  secondary to fetal position. ---------------------------------------------------------------------- Recommendations  Follow up as clinically indicated. ----------------------------------------------------------------------               Lin Landsman, MD Electronically Signed Final Report   03/24/2019 03:04 pm  ----------------------------------------------------------------------   Assessment and Plan:  Pregnancy: [id number] at 102w1d 1. Supervision of other normal pregnancy, antepartum - Patient doing well, reports she just had anatomy US recently and is having a girl - Request refill of medication: Wellbutrin, Advair and Proair, Rx for refill sent to pharmacy on file  - High risk pregnancy d/t subutex use, tobacco use and hx of C/S  - Anticipatory guidance on upcoming appointments with next one being MyChart visit  - Patient reports she has assess to babyscripts app but never received BP cuff, Rx for BP cuff resent and patient gave updated address - Patient reports Remus Loffler is not working anymore, patient requested to increase dosage- educated and discussed  is the recommended dosage in women and in pregnancy, discussed use of magnesium supplement instead of ambien for help with sleep and constipation. Encouraged patient to take  Magnesium nightly for sleep and constipation, patient verbalizes understanding.  - EDC changed to 08/30/2019 based on anatomy US due to uncertain LMP dates   2. Pregnancy complicated by subutex maintenance, antepartum (HCC) - currently on  Subutex  - F/U US scheduled   3. Smoker - currently everyday smoker, is trying to cut down on amount of cigarettes per day because she is "not wanting to harm baby"  4. History of cesarean delivery, currently pregnant - Hx of C/S, successful VBAC at home with last child  - Plans VBAC at this time, needs consent signed with MD at next in person appointment   Preterm labor symptoms and general obstetric precautions including but not limited to vaginal bleeding, contractions, leaking of fluid and fetal movement were reviewed in detail with the patient. I discussed the assessment and treatment plan with the patient. The patient was provided an opportunity to ask questions and all were answered. The patient agreed with the plan and  demonstrated an understanding of the instructions. The patient was advised to call back or seek an in-person office evaluation/go to MAU at [facility] for any urgent or concerning symptoms. Please refer to After Visit Summary for other counseling recommendations.   I provided 17 minutes of face-to-face time during this encounter.  Return in about 4 weeks (around 04/27/2019) for HROB- mychart visit .  Future Appointments  Date Time Provider Department Center  04/21/2019 10:30 AM WH-MFC NURSE WH-MFC MFC-US  04/21/2019 10:30 AM WH-MFC [place] 1 WH-MFCUS MFC-US    Sharyon Cable, CNM [facility]

## 2019-04-21 ENCOUNTER — Ambulatory Visit (HOSPITAL_COMMUNITY): Payer: Medicaid Other

## 2019-04-25 ENCOUNTER — Other Ambulatory Visit: Payer: Self-pay | Admitting: Certified Nurse Midwife

## 2019-04-25 DIAGNOSIS — Z3480 Encounter for supervision of other normal pregnancy, unspecified trimester: Secondary | ICD-10-CM

## 2019-04-27 ENCOUNTER — Encounter: Payer: Self-pay | Admitting: Obstetrics and Gynecology

## 2019-04-27 ENCOUNTER — Ambulatory Visit (INDEPENDENT_AMBULATORY_CARE_PROVIDER_SITE_OTHER): Payer: Medicaid Other | Admitting: Obstetrics and Gynecology

## 2019-04-27 VITALS — BP 113/74 | HR 77

## 2019-04-27 DIAGNOSIS — F1120 Opioid dependence, uncomplicated: Secondary | ICD-10-CM

## 2019-04-27 DIAGNOSIS — Z3A22 22 weeks gestation of pregnancy: Secondary | ICD-10-CM

## 2019-04-27 DIAGNOSIS — O99322 Drug use complicating pregnancy, second trimester: Secondary | ICD-10-CM

## 2019-04-27 DIAGNOSIS — Z3480 Encounter for supervision of other normal pregnancy, unspecified trimester: Secondary | ICD-10-CM

## 2019-04-27 DIAGNOSIS — O34219 Maternal care for unspecified type scar from previous cesarean delivery: Secondary | ICD-10-CM

## 2019-04-27 DIAGNOSIS — O99320 Drug use complicating pregnancy, unspecified trimester: Secondary | ICD-10-CM

## 2019-04-27 NOTE — Progress Notes (Signed)
Pt presents for a ORB webex visit. Pt identified with 2 pt identifiers. She is currently [redacted]w[redacted]d. Her BP today is 113/74. Pt would like to discuss not getting sleep.

## 2019-04-27 NOTE — Progress Notes (Signed)
   [place] VIRTUAL VIDEO VISIT ENCOUNTER NOTE  Provider location: [facility] [place]   I connected with Brittany Cortez on 04/27/19 at  3:30 PM EDT by WebEx Encounter at home and verified that I am speaking with the correct person using two identifiers.   I discussed the limitations, risks, security and privacy concerns of performing an evaluation and management service virtually and the availability of in person appointments. I also discussed with the patient that there may be a patient responsible charge related to this service. The patient expressed understanding and agreed to proceed. Subjective:  Brittany Cortez is a 33 y.o. G3P2002 at [redacted]w[redacted]d being seen today for ongoing prenatal care.  She is currently monitored for the following issues for this high-risk pregnancy and has Asthma; Hx of narcotic abuse; Severe alcohol use disorder ([place]); Opioid dependence ([place]); ADHD (attention deficit hyperactivity disorder); Substance induced mood disorder ([place]); Smoker; Supervision of other normal pregnancy, antepartum; Pregnancy complicated by subutex maintenance, antepartum ([place]); and History of cesarean delivery, currently pregnant on their problem list.  Patient reports general discomforts of pregnancy.  Contractions: Not present. Vag. Bleeding: None.  Movement: Present. Denies any leaking of fluid.   The following portions of the patient's history were reviewed and updated as appropriate: allergies, current medications, past family history, past medical history, past social history, past surgical history and problem list.   Objective:   Vitals:   04/27/19 1538  BP: 113/74  Pulse: 77    Fetal Status:     Movement: Present     General:  Alert, oriented and cooperative. Patient is in no acute distress.  Respiratory: Normal respiratory effort, no problems with respiration noted  Mental Status: Normal mood and affect. Normal behavior. Normal judgment and  thought content.  Rest of physical exam deferred due to type of encounter  Imaging: No results found.  Assessment and Plan:  Pregnancy: G3P2002 at [redacted]w[redacted]d 1. Supervision of other normal pregnancy, antepartum Stable U/S next week Glucola and TOLAC consent next visit Also considering BTL  2. Pregnancy complicated by subutex maintenance, antepartum ([place]) Stable  3. History of cesarean delivery, currently pregnant TOLAC consent next visit  Preterm labor symptoms and general obstetric precautions including but not limited to vaginal bleeding, contractions, leaking of fluid and fetal movement were reviewed in detail with the patient. I discussed the assessment and treatment plan with the patient. The patient was provided an opportunity to ask questions and all were answered. The patient agreed with the plan and demonstrated an understanding of the instructions. The patient was advised to call back or seek an in-person office evaluation/go to MAU at [facility] for any urgent or concerning symptoms. Please refer to After Visit Summary for other counseling recommendations.   I provided 10 minutes of face-to-face time during this encounter.  Return in about 4 weeks (around 05/25/2019) for OB visit, face to face for Glucola.  Future Appointments  Date Time Provider [place]  05/01/2019  1:15 PM WH-MFC [place] 4 WH-MFCUS MFC-US  05/01/2019  1:20 PM [place] MFC-US    Chancy Milroy, [facility] for [facility] [place] [facility]

## 2019-05-01 ENCOUNTER — Other Ambulatory Visit: Payer: Self-pay

## 2019-05-01 ENCOUNTER — Encounter (HOSPITAL_COMMUNITY): Payer: Self-pay

## 2019-05-01 ENCOUNTER — Ambulatory Visit (HOSPITAL_COMMUNITY): Payer: Medicaid Other | Admitting: *Deleted

## 2019-05-01 ENCOUNTER — Other Ambulatory Visit (HOSPITAL_COMMUNITY): Payer: Self-pay | Admitting: *Deleted

## 2019-05-01 ENCOUNTER — Ambulatory Visit (HOSPITAL_COMMUNITY)
Admission: RE | Admit: 2019-05-01 | Discharge: 2019-05-01 | Disposition: A | Discharge status: Home / Self Care | Payer: Medicaid Other | Source: Ambulatory Visit | Attending: Maternal & Fetal Medicine | Admitting: Maternal & Fetal Medicine

## 2019-05-01 DIAGNOSIS — Z362 Encounter for other antenatal screening follow-up: Secondary | ICD-10-CM | POA: Diagnosis present

## 2019-05-01 DIAGNOSIS — O34219 Maternal care for unspecified type scar from previous cesarean delivery: Secondary | ICD-10-CM

## 2019-05-01 DIAGNOSIS — F1120 Opioid dependence, uncomplicated: Secondary | ICD-10-CM | POA: Diagnosis not present

## 2019-05-01 DIAGNOSIS — O99322 Drug use complicating pregnancy, second trimester: Secondary | ICD-10-CM

## 2019-05-01 DIAGNOSIS — Z3687 Encounter for antenatal screening for uncertain dates: Secondary | ICD-10-CM

## 2019-05-01 DIAGNOSIS — O99320 Drug use complicating pregnancy, unspecified trimester: Secondary | ICD-10-CM | POA: Diagnosis present

## 2019-05-01 DIAGNOSIS — O99332 Smoking (tobacco) complicating pregnancy, second trimester: Secondary | ICD-10-CM

## 2019-05-01 DIAGNOSIS — Z3A22 22 weeks gestation of pregnancy: Secondary | ICD-10-CM

## 2019-05-19 ENCOUNTER — Telehealth: Payer: Self-pay

## 2019-05-19 NOTE — Telephone Encounter (Signed)
Patient called to request letter to be faxed to her dentist for a cleaning. Letter faxed to [facility] at [phone]. Fax number provided by patient.

## 2019-05-23 ENCOUNTER — Other Ambulatory Visit: Payer: Self-pay | Admitting: Certified Nurse Midwife

## 2019-05-23 ENCOUNTER — Telehealth: Payer: Self-pay

## 2019-05-23 DIAGNOSIS — Z3480 Encounter for supervision of other normal pregnancy, unspecified trimester: Secondary | ICD-10-CM

## 2019-05-23 NOTE — Telephone Encounter (Signed)
TC from pt requesting refill on Wellbutrin and Ambien pt states she was told our office would refill her Rx's.   Please advise.

## 2019-05-24 MED ORDER — BUPROPION HCL ER (XL) 150 MG PO TB24: 150.0000 mg | ORAL_TABLET | Freq: Every day | ORAL | 1 refills | Status: DC

## 2019-05-25 ENCOUNTER — Encounter: Payer: Medicaid Other | Admitting: Obstetrics and Gynecology

## 2019-05-25 ENCOUNTER — Other Ambulatory Visit: Payer: Medicaid Other

## 2019-05-31 ENCOUNTER — Telehealth: Payer: Self-pay

## 2019-05-31 NOTE — Telephone Encounter (Signed)
  Refills request for Zolpidem has been Denied.

## 2019-06-05 ENCOUNTER — Encounter (HOSPITAL_COMMUNITY): Payer: Self-pay

## 2019-06-05 ENCOUNTER — Other Ambulatory Visit: Payer: Self-pay

## 2019-06-05 ENCOUNTER — Other Ambulatory Visit (HOSPITAL_COMMUNITY): Payer: Self-pay | Admitting: *Deleted

## 2019-06-05 ENCOUNTER — Ambulatory Visit (HOSPITAL_COMMUNITY)
Admission: RE | Admit: 2019-06-05 | Discharge: 2019-06-05 | Disposition: A | Discharge status: Home / Self Care | Payer: Medicaid Other | Source: Ambulatory Visit | Attending: Obstetrics and Gynecology | Admitting: Obstetrics and Gynecology

## 2019-06-05 ENCOUNTER — Ambulatory Visit (HOSPITAL_COMMUNITY): Payer: Medicaid Other | Admitting: *Deleted

## 2019-06-05 DIAGNOSIS — O99320 Drug use complicating pregnancy, unspecified trimester: Secondary | ICD-10-CM

## 2019-06-05 DIAGNOSIS — F1120 Opioid dependence, uncomplicated: Secondary | ICD-10-CM

## 2019-06-05 DIAGNOSIS — Z362 Encounter for other antenatal screening follow-up: Secondary | ICD-10-CM | POA: Diagnosis not present

## 2019-06-05 DIAGNOSIS — O34219 Maternal care for unspecified type scar from previous cesarean delivery: Secondary | ICD-10-CM

## 2019-06-05 DIAGNOSIS — O99322 Drug use complicating pregnancy, second trimester: Secondary | ICD-10-CM

## 2019-06-05 DIAGNOSIS — Z79891 Long term (current) use of opiate analgesic: Secondary | ICD-10-CM

## 2019-06-05 DIAGNOSIS — O99332 Smoking (tobacco) complicating pregnancy, second trimester: Secondary | ICD-10-CM | POA: Diagnosis not present

## 2019-06-05 DIAGNOSIS — Z3A27 27 weeks gestation of pregnancy: Secondary | ICD-10-CM

## 2019-06-06 ENCOUNTER — Ambulatory Visit (INDEPENDENT_AMBULATORY_CARE_PROVIDER_SITE_OTHER): Payer: Medicaid Other | Admitting: Obstetrics & Gynecology

## 2019-06-06 ENCOUNTER — Other Ambulatory Visit: Payer: Medicaid Other

## 2019-06-06 DIAGNOSIS — Z3482 Encounter for supervision of other normal pregnancy, second trimester: Secondary | ICD-10-CM

## 2019-06-06 DIAGNOSIS — Z3A27 27 weeks gestation of pregnancy: Secondary | ICD-10-CM

## 2019-06-06 DIAGNOSIS — Z3480 Encounter for supervision of other normal pregnancy, unspecified trimester: Secondary | ICD-10-CM

## 2019-06-06 MED ORDER — GABAPENTIN 600 MG PO TABS: 600.0000 mg | ORAL_TABLET | Freq: Three times a day (TID) | ORAL | 2 refills | Status: DC

## 2019-06-06 MED ORDER — QUETIAPINE FUMARATE 400 MG PO TABS: ORAL_TABLET | ORAL | 0 refills | Status: AC

## 2019-06-06 MED ORDER — BUPROPION HCL ER (XL) 150 MG PO TB24: 150.0000 mg | ORAL_TABLET | Freq: Every day | ORAL | 2 refills | Status: DC

## 2019-06-06 NOTE — Progress Notes (Signed)
Pt states she had u/s yesterday and needs f/u in 4 weeks. Pt will need to reschedule 2GTT as she missed appt today.

## 2019-06-06 NOTE — Progress Notes (Signed)
   PRENATAL VISIT NOTE  Subjective:  Brittany Cortez is a 33 y.o. G3P2002 at [redacted]w[redacted]d being seen today for ongoing prenatal care.  She is currently monitored for the following issues for this high-risk pregnancy and has Asthma; Hx of narcotic abuse; Severe alcohol use disorder ([place]); Opioid dependence ([place]); ADHD (attention deficit hyperactivity disorder); Substance induced mood disorder ([place]); Smoker; Supervision of other normal pregnancy, antepartum; Pregnancy complicated by subutex maintenance, antepartum ([place]); and History of cesarean delivery, currently pregnant on their problem list.  Patient reports no complaints.  Contractions: Not present. Vag. Bleeding: None.  Movement: Present. Denies leaking of fluid.   The following portions of the patient's history were reviewed and updated as appropriate: allergies, current medications, past family history, past medical history, past social history, past surgical history and problem list.   Objective:   Vitals:   06/06/19 1031  BP: (!) 104/59  Pulse: 95  Weight: 201 lb (91.2 kg)    Fetal Status: Fetal Heart Rate (bpm): 140   Movement: Present     General:  Alert, oriented and cooperative. Patient is in no acute distress.  Skin: Skin is warm and dry. No rash noted.   Cardiovascular: Normal heart rate noted  Respiratory: Normal respiratory effort, no problems with respiration noted  Abdomen: Soft, gravid, appropriate for gestational age.  Pain/Pressure: Absent     Pelvic: Cervical exam deferred        Extremities: Normal range of motion.     Mental Status: Normal mood and affect. Normal behavior. Normal judgment and thought content.   Assessment and Plan:  Pregnancy: G3P2002 at [redacted]w[redacted]d 1. Supervision of other normal pregnancy, antepartum Refill medication - gabapentin (NEURONTIN) 600 MG tablet; Take 1 tablet (600 mg total) by mouth 3 (three) times daily.  Dispense: 90 tablet; Refill: 2 - buPROPion (WELLBUTRIN XL) 150 MG 24 hr tablet; Take 1  tablet (150 mg total) by mouth daily.  Dispense: 30 tablet; Refill: 2 - QUEtiapine (SEROQUEL) 400 MG tablet; Take 1 tablet (400 mg total) by mouth at bedtime for 120 days, THEN 1 tablet (400 mg total) at bedtime.  Dispense: 240 tablet; Refill: 0  Preterm labor symptoms and general obstetric precautions including but not limited to vaginal bleeding, contractions, leaking of fluid and fetal movement were reviewed in detail with the patient. Please refer to After Visit Summary for other counseling recommendations.  Discussed TOLAC vs r/CS Return in about 1 week (around 06/13/2019) for 2 hr and sign papers.  Future Appointments  Date Time Provider [place]  07/03/2019 12:45 PM [place] NURSE [place] MFC-US  07/03/2019 12:45 PM [place] 2 WH-MFCUS MFC-US    Emeterio Reeve, MD

## 2019-06-06 NOTE — Patient Instructions (Signed)
Vaginal Birth After Cesarean Delivery  Vaginal birth after cesarean delivery (VBAC) is giving birth vaginally after previously delivering a baby through a cesarean section (C-section). A VBAC may be a safe option for you, depending on your health and other factors. It is important to discuss VBAC with your health care provider early in your pregnancy so you can understand the risks, benefits, and options. Having these discussions early will give you time to make your birth plan. Who are the best candidates for VBAC? The best candidates for VBAC are women who:  Have had one or two prior cesarean deliveries, and the incision made during the delivery was horizontal (low transverse).  Do not have a vertical (classical) scar on their uterus.  Have not had a tear in the wall of their uterus (uterine rupture).  Plan to have more pregnancies. A VBAC is also more likely to be successful:  In women who have previously given birth vaginally.  When labor starts by itself (spontaneously) before the due date. What are the benefits of VBAC? The benefits of delivering your baby vaginally instead of by a cesarean delivery include:  A shorter hospital stay.  A faster recovery time.  Less pain.  Avoiding risks associated with major surgery, such as infection and blood clots.  Less blood loss and less need for donated blood (transfusions). What are the risks of VBAC? The main risk of attempting a VBAC is that it may fail, forcing your health care provider to deliver your baby by a C-section. Other risks are rare and include:  Tearing (rupture) of the scar from a past cesarean delivery.  Other risks associated with vaginal deliveries. If a repeat cesarean delivery is needed, the risks include:  Blood loss.  Infection.  Blood clot.  Damage to surrounding organs.  Removal of the uterus (hysterectomy), if it is damaged.  Placenta problems in future pregnancies. What else should I know  about my options? Delivering a baby through a VBAC is similar to having a normal spontaneous vaginal delivery. Therefore, it is safe:  To try with twins.  For your health care provider to try to turn the baby from a breech position (external cephalic version) during labor.  With epidural analgesia for pain relief. Consider where you would like to deliver your baby. VBAC should be attempted in facilities where an emergency cesarean delivery can be performed. VBAC is not recommended for home births. Any changes in your health or your baby's health during your pregnancy may make it necessary to change your initial decision about VBAC. Your health care provider may recommend that you do not attempt a VBAC if:  Your baby's suspected weight is 8.8 lb (4 kg) or more.  You have preeclampsia. This is a condition that causes high blood pressure along with other symptoms, such as swelling and headaches.  You will have VBAC less than 19 months after your cesarean delivery.  You are past your due date.  You need to have labor started (induced) because your cervix is not ready for labor (unfavorable). Where to find more information  American Pregnancy Association: americanpregnancy.org  American Congress of Obstetricians and Gynecologists: acog.org Summary  Vaginal birth after cesarean delivery (VBAC) is giving birth vaginally after previously delivering a baby through a cesarean section (C-section). A VBAC may be a safe option for you, depending on your health and other factors.  Discuss VBAC with your health care provider early in your pregnancy so you can understand the risks, benefits, options, and   have plenty of time to make your birth plan.  The main risk of attempting a VBAC is that it may fail, forcing your health care provider to deliver your baby by a C-section. Other risks are rare. This information is not intended to replace advice given to you by your health care provider. Make sure  you discuss any questions you have with your health care provider. Document Released: 03/14/2007 Document Revised: 01/17/2019 Document Reviewed: 12/29/2016 Elsevier Patient Education  2020 Elsevier Inc.  

## 2019-06-07 ENCOUNTER — Telehealth: Payer: Self-pay

## 2019-06-07 NOTE — Telephone Encounter (Signed)
Patient had appointment with you yesterday. She forgot to get her clonidine HCL. 0.1MG . She is requesting this be refill and sent to [facility].

## 2019-06-08 ENCOUNTER — Other Ambulatory Visit: Payer: Self-pay | Admitting: Obstetrics & Gynecology

## 2019-06-15 ENCOUNTER — Other Ambulatory Visit: Payer: Medicaid Other

## 2019-06-19 ENCOUNTER — Other Ambulatory Visit: Payer: Self-pay | Admitting: Obstetrics and Gynecology

## 2019-06-19 ENCOUNTER — Other Ambulatory Visit: Payer: Medicaid Other

## 2019-06-19 DIAGNOSIS — Z3480 Encounter for supervision of other normal pregnancy, unspecified trimester: Secondary | ICD-10-CM

## 2019-06-21 ENCOUNTER — Ambulatory Visit (INDEPENDENT_AMBULATORY_CARE_PROVIDER_SITE_OTHER): Payer: Medicaid Other | Admitting: Obstetrics & Gynecology

## 2019-06-21 VITALS — BP 111/73 | HR 96

## 2019-06-21 DIAGNOSIS — O99323 Drug use complicating pregnancy, third trimester: Secondary | ICD-10-CM

## 2019-06-21 DIAGNOSIS — Z3A30 30 weeks gestation of pregnancy: Secondary | ICD-10-CM

## 2019-06-21 DIAGNOSIS — F1129 Opioid dependence with unspecified opioid-induced disorder: Secondary | ICD-10-CM | POA: Diagnosis not present

## 2019-06-21 MED ORDER — CLONIDINE HCL 0.1 MG PO TABS: 0.1000 mg | ORAL_TABLET | Freq: Three times a day (TID) | ORAL | 3 refills | Status: AC

## 2019-06-21 NOTE — Progress Notes (Signed)
Pt needs refill on Clonidine 0.1mg  TID. Pt is scheduled for GTT on Friday.   TELEHEALTH VIRTUAL OBSTETRICS VISIT ENCOUNTER NOTE  I connected with Brittany Cortez on 06/21/19 at 10:00 AM EDT by telephone at home and verified that I am speaking with the correct person using two identifiers.   I discussed the limitations, risks, security and privacy concerns of performing an evaluation and management service by telephone and the availability of in person appointments. I also discussed with the patient that there may be a patient responsible charge related to this service. The patient expressed understanding and agreed to proceed.  Subjective:  Brittany Cortez is a 33 y.o. G3P2002 at [redacted]w[redacted]d being followed for ongoing prenatal care.  She is currently monitored for the following issues for this high-risk pregnancy and has Asthma; Hx of narcotic abuse; Severe alcohol use disorder ([place]); Opioid dependence ([place]); ADHD (attention deficit hyperactivity disorder); Substance induced mood disorder ([place]); Smoker; Supervision of other normal pregnancy, antepartum; Pregnancy complicated by subutex maintenance, antepartum ([place]); and History of cesarean delivery, currently pregnant on their problem list.  Patient reports no complaints. Reports fetal movement. Denies any contractions, bleeding or leaking of fluid.   The following portions of the patient's history were reviewed and updated as appropriate: allergies, current medications, past family history, past medical history, past social history, past surgical history and problem list.   Objective:   General:  Alert, oriented and cooperative.   Mental Status: Normal mood and affect perceived. Normal judgment and thought content.  Rest of physical exam deferred due to type of encounter  Assessment and Plan:  Pregnancy: G3P2002 at [redacted]w[redacted]d 1. Opioid dependence with opioid-induced disorder ([place]) She needs refill for medication previously prescribed by [facility] -  cloNIDine (CATAPRES) 0.1 MG tablet; Take 1 tablet (0.1 mg total) by mouth 3 (three) times daily.  Dispense: 90 tablet; Refill: 3  Preterm labor symptoms and general obstetric precautions including but not limited to vaginal bleeding, contractions, leaking of fluid and fetal movement were reviewed in detail with the patient.  I discussed the assessment and treatment plan with the patient. The patient was provided an opportunity to ask questions and all were answered. The patient agreed with the plan and demonstrated an understanding of the instructions. The patient was advised to call back or seek an in-person office evaluation/go to MAU at [facility] for any urgent or concerning symptoms. Please refer to After Visit Summary for other counseling recommendations.   I provided 10 minutes of non-face-to-face time during this encounter. F/u for 28 week labs asap No follow-ups on file.  Future Appointments  Date Time Provider [place]  06/23/2019  9:15 AM [facility] None  07/03/2019 12:45 PM [place] NURSE [place] [facility]  07/03/2019 12:45 PM [place]    Emeterio Reeve, [place] for [place], [place]

## 2019-06-21 NOTE — Patient Instructions (Signed)
Clonidine tablets What is this medicine? CLONIDINE (KLOE ni deen) is used to treat high blood pressure. This medicine may be used for other purposes; ask your health care provider or pharmacist if you have questions. COMMON BRAND NAME(S): Catapres What should I tell my health care provider before I take this medicine? They need to know if you have any of these conditions:  kidney disease  an unusual or allergic reaction to clonidine, other medicines, foods, dyes, or preservatives  pregnant or trying to get pregnant  breast-feeding How should I use this medicine? Take this medicine by mouth with a glass of water. Follow the directions on the prescription label. Take your doses at regular intervals. Do not take your medicine more often than directed. Do not suddenly stop taking this medicine. You must gradually reduce the dose or you may get a dangerous increase in blood pressure. Ask your doctor or health care professional for advice. Talk to your pediatrician regarding the use of this medicine in children. Special care may be needed. Overdosage: If you think you have taken too much of this medicine contact a poison control center or emergency room at once. NOTE: This medicine is only for you. Do not share this medicine with others. What if I miss a dose? If you miss a dose, take it as soon as you can. If it is almost time for your next dose, take only that dose. Do not take double or extra doses. What may interact with this medicine? Do not take this medicine with any of the following medications:  MAOIs like Carbex, Eldepryl, Marplan, Nardil, and Parnate This medicine may also interact with the following medications:  barbiturate medicines for inducing sleep or treating seizures like phenobarbital  certain medicines for blood pressure, heart disease, irregular heart beat  certain medicines for depression, anxiety, or psychotic disturbances  prescription pain medicines This list  may not describe all possible interactions. Give your health care provider a list of all the medicines, herbs, non-prescription drugs, or dietary supplements you use. Also tell them if you smoke, drink alcohol, or use illegal drugs. Some items may interact with your medicine. What should I watch for while using this medicine? Visit your doctor or health care professional for regular checks on your progress. Check your heart rate and blood pressure regularly while you are taking this medicine. Ask your doctor or health care professional what your heart rate should be and when you should contact him or her. You may get drowsy or dizzy. Do not drive, use machinery, or do anything that needs mental alertness until you know how this medicine affects you. To avoid dizzy or fainting spells, do not stand or sit up quickly, especially if you are an older person. Alcohol can make you more drowsy and dizzy. Avoid alcoholic drinks. Your mouth may get dry. Chewing sugarless gum or sucking hard candy, and drinking plenty of water will help. Do not treat yourself for coughs, colds, or pain while you are taking this medicine without asking your doctor or health care professional for advice. Some ingredients may increase your blood pressure. If you are going to have surgery tell your doctor or health care professional that you are taking this medicine. What side effects may I notice from receiving this medicine? Side effects that you should report to your doctor or health care professional as soon as possible:  allergic reactions like skin rash, itching or hives, swelling of the face, lips, or tongue  anxiety, nervousness    chest pain  depression  fast, irregular heartbeat  swelling of feet or legs  unusually weak or tired Side effects that usually do not require medical attention (report to your doctor or health care professional if they continue or are bothersome):  change in sex drive or  performance  constipation  headache This list may not describe all possible side effects. Call your doctor for medical advice about side effects. You may report side effects to FDA at 1-800-FDA-1088. Where should I keep my medicine? Keep out of the reach of children. Store at room temperature between 15 and 30 degrees C (59 and 86 degrees F). Protect from light. Keep container tightly closed. Throw away any unused medicine after the expiration date. NOTE: This sheet is a summary. It may not cover all possible information. If you have questions about this medicine, talk to your doctor, pharmacist, or health care provider.  2020 Elsevier/Gold Standard (2011-03-18 13:01:28)  

## 2019-06-23 ENCOUNTER — Other Ambulatory Visit: Payer: Self-pay

## 2019-06-23 ENCOUNTER — Other Ambulatory Visit: Payer: Medicaid Other

## 2019-06-23 ENCOUNTER — Ambulatory Visit: Payer: Medicaid Other | Admitting: Internal Medicine

## 2019-06-23 DIAGNOSIS — F1120 Opioid dependence, uncomplicated: Secondary | ICD-10-CM

## 2019-06-23 DIAGNOSIS — Z3480 Encounter for supervision of other normal pregnancy, unspecified trimester: Secondary | ICD-10-CM

## 2019-06-23 DIAGNOSIS — O34219 Maternal care for unspecified type scar from previous cesarean delivery: Secondary | ICD-10-CM

## 2019-06-23 DIAGNOSIS — O99320 Drug use complicating pregnancy, unspecified trimester: Secondary | ICD-10-CM

## 2019-06-23 NOTE — Progress Notes (Signed)
Patient accidentally scheduled with provider when needed labs only.   Brittany Cortez, D.O. [facility] Family Medicine Fellow, [facility] for [facility], [facility] 06/23/2019, 10:28 AM

## 2019-06-24 LAB — GLUCOSE TOLERANCE, 2 HOURS W/ 1HR
Glucose, 1 hour: 134 mg/dL (ref 65–179)
Glucose, 2 hour: 116 mg/dL (ref 65–152)
Glucose, Fasting: 79 mg/dL (ref 65–91)

## 2019-06-24 LAB — CBC
Hematocrit: 36.6 % (ref 34.0–46.6)
Hemoglobin: 12.2 g/dL (ref 11.1–15.9)
MCH: 29.7 pg (ref 26.6–33.0)
MCHC: 33.3 g/dL (ref 31.5–35.7)
MCV: 89 fL (ref 79–97)
Platelets: 373 10*3/uL (ref 150–450)
RBC: 4.11 x10E6/uL (ref 3.77–5.28)
RDW: 11.8 % (ref 11.7–15.4)
WBC: 12.5 10*3/uL — ABNORMAL HIGH (ref 3.4–10.8)

## 2019-06-24 LAB — RPR: RPR Ser Ql: NONREACTIVE

## 2019-06-24 LAB — HIV ANTIBODY (ROUTINE TESTING W REFLEX): HIV Screen 4th Generation wRfx: NONREACTIVE

## 2019-07-03 ENCOUNTER — Ambulatory Visit (HOSPITAL_COMMUNITY): Payer: Medicaid Other

## 2019-07-04 ENCOUNTER — Encounter (HOSPITAL_COMMUNITY): Payer: Self-pay

## 2019-07-07 ENCOUNTER — Other Ambulatory Visit (HOSPITAL_COMMUNITY): Payer: Self-pay | Admitting: *Deleted

## 2019-07-07 ENCOUNTER — Telehealth: Payer: Medicaid Other | Admitting: Women's Health

## 2019-07-07 ENCOUNTER — Other Ambulatory Visit: Payer: Self-pay

## 2019-07-07 ENCOUNTER — Ambulatory Visit (HOSPITAL_COMMUNITY): Payer: Medicaid Other | Admitting: *Deleted

## 2019-07-07 ENCOUNTER — Ambulatory Visit (HOSPITAL_COMMUNITY)
Admission: RE | Admit: 2019-07-07 | Discharge: 2019-07-07 | Disposition: A | Discharge status: Home / Self Care | Payer: Medicaid Other | Source: Ambulatory Visit | Attending: Maternal & Fetal Medicine | Admitting: Maternal & Fetal Medicine

## 2019-07-07 ENCOUNTER — Encounter (HOSPITAL_COMMUNITY): Payer: Self-pay

## 2019-07-07 DIAGNOSIS — O99891 Other specified diseases and conditions complicating pregnancy: Secondary | ICD-10-CM

## 2019-07-07 DIAGNOSIS — O99333 Smoking (tobacco) complicating pregnancy, third trimester: Secondary | ICD-10-CM

## 2019-07-07 DIAGNOSIS — O34219 Maternal care for unspecified type scar from previous cesarean delivery: Secondary | ICD-10-CM

## 2019-07-07 DIAGNOSIS — J45909 Unspecified asthma, uncomplicated: Secondary | ICD-10-CM

## 2019-07-07 DIAGNOSIS — Z3A32 32 weeks gestation of pregnancy: Secondary | ICD-10-CM

## 2019-07-07 DIAGNOSIS — F1120 Opioid dependence, uncomplicated: Secondary | ICD-10-CM

## 2019-07-07 DIAGNOSIS — O99320 Drug use complicating pregnancy, unspecified trimester: Secondary | ICD-10-CM

## 2019-07-07 HISTORY — DX: Alcohol abuse, uncomplicated: F10.10

## 2019-07-14 ENCOUNTER — Other Ambulatory Visit: Payer: Self-pay

## 2019-07-14 ENCOUNTER — Telehealth (INDEPENDENT_AMBULATORY_CARE_PROVIDER_SITE_OTHER): Payer: Medicaid Other | Admitting: Family Medicine

## 2019-07-14 VITALS — BP 113/74 | HR 84

## 2019-07-14 DIAGNOSIS — O99323 Drug use complicating pregnancy, third trimester: Secondary | ICD-10-CM | POA: Diagnosis not present

## 2019-07-14 DIAGNOSIS — F1120 Opioid dependence, uncomplicated: Secondary | ICD-10-CM | POA: Diagnosis not present

## 2019-07-14 DIAGNOSIS — Z3480 Encounter for supervision of other normal pregnancy, unspecified trimester: Secondary | ICD-10-CM

## 2019-07-14 DIAGNOSIS — Z6791 Unspecified blood type, Rh negative: Secondary | ICD-10-CM

## 2019-07-14 DIAGNOSIS — O360130 Maternal care for anti-D [Rh] antibodies, third trimester, not applicable or unspecified: Secondary | ICD-10-CM | POA: Diagnosis not present

## 2019-07-14 DIAGNOSIS — O34219 Maternal care for unspecified type scar from previous cesarean delivery: Secondary | ICD-10-CM | POA: Diagnosis not present

## 2019-07-14 DIAGNOSIS — Z3A33 33 weeks gestation of pregnancy: Secondary | ICD-10-CM

## 2019-07-14 DIAGNOSIS — O99320 Drug use complicating pregnancy, unspecified trimester: Secondary | ICD-10-CM

## 2019-07-14 NOTE — Progress Notes (Signed)
I connected with  Brittany Cortez on 07/14/19 by a video enabled telemedicine application and verified that I am speaking with the correct person using two identifiers.   Patient wants to use [employer].  C/o lots of pressure.

## 2019-07-14 NOTE — Progress Notes (Signed)
   TELEHEALTH VIRTUAL OBSTETRICS VISIT ENCOUNTER NOTE  I connected with Brittany Cortez on 07/14/19 at 11:15 AM EDT by telephone at home and verified that I am speaking with the correct person using two identifiers.   I discussed the limitations, risks, security and privacy concerns of performing an evaluation and management service by telephone and the availability of in person appointments. I also discussed with the patient that there may be a patient responsible charge related to this service. The patient expressed understanding and agreed to proceed.  Subjective:  Brittany Cortez is a 33 y.o. G3P2002 at [redacted]w[redacted]d being followed for ongoing prenatal care.  She is currently monitored for the following issues for this low-risk pregnancy and has Asthma; Hx of narcotic abuse; Severe alcohol use disorder ([place]); Opioid dependence ([place]); ADHD (attention deficit hyperactivity disorder); Substance induced mood disorder ([place]); Smoker; Supervision of other normal pregnancy, antepartum; Pregnancy complicated by subutex maintenance, antepartum ([place]); and History of cesarean delivery, currently pregnant on their problem list.  Patient reports some pelvic pressure but denies other complaints. Reports fetal movement. Denies any contractions, bleeding or leaking of fluid.   The following portions of the patient's history were reviewed and updated as appropriate: allergies, current medications, past family history, past medical history, past social history, past surgical history and problem list.   Objective:   Vitals:   07/14/19 1123  BP: 113/74  Pulse: 84   General:  Alert, oriented and cooperative.   Mental Status: Normal mood and affect perceived. Normal judgment and thought content.  Rest of physical exam deferred due to type of encounter  Assessment and Plan:  Pregnancy: G3P2002 at [redacted]w[redacted]d 1. Supervision of other normal pregnancy, antepartum 28 week labs reviewed and WNL [place] 10/2: Amniotic fluid is normal  and good fetal activity is seen. Fetal growth is appropriate for gestational age. Incidentally observed antenatal testing is reassuring (BPP 8/8). BTL consent already signed  RTC at 36 weeks in-person Tdap at RN visit with Rhogam indicated  2. History of cesarean delivery, currently pregnant C-section with first pregnancy due to breech presentation Needs VBAC/TOLAC consent signed at next in-person visit   3. Pregnancy complicated by subutex maintenance, antepartum (HCC) Wellbutrin refilled today Cont Subutex and Clonidine  Feels mood is overall good   4. Rh negative - unsure blood type of FOB; has not yet received rhogam this pregnancy; RN visit requested ASAP  Preterm labor symptoms and general obstetric precautions including but not limited to vaginal bleeding, contractions, leaking of fluid and fetal movement were reviewed in detail with the patient.  I discussed the assessment and treatment plan with the patient. The patient was provided an opportunity to ask questions and all were answered. The patient agreed with the plan and demonstrated an understanding of the instructions. The patient was advised to call back or seek an in-person office evaluation/go to [facility]U at [facility] for any urgent or concerning symptoms. Please refer to After Visit Summary for other counseling recommendations.   I provided 15 minutes of non-face-to-face time during this encounter.  Return in about 3 weeks (around 08/04/2019), or Would like patient to come back right at 36 weeks in-person.  Future Appointments  Date Time Provider [place]  08/04/2019  9:30 AM [place] NURSE [place] [facility]FC-US  08/04/2019  9:30 AM [place] 1 WH-MFCUS MFC-US    Chauncey Mann, MD Center for [employer], [place]

## 2019-07-21 ENCOUNTER — Other Ambulatory Visit: Payer: Self-pay

## 2019-07-21 ENCOUNTER — Ambulatory Visit (INDEPENDENT_AMBULATORY_CARE_PROVIDER_SITE_OTHER): Payer: Medicaid Other

## 2019-07-21 VITALS — BP 110/73 | HR 88 | Wt 213.6 lb

## 2019-07-21 DIAGNOSIS — O360130 Maternal care for anti-D [Rh] antibodies, third trimester, not applicable or unspecified: Secondary | ICD-10-CM | POA: Diagnosis not present

## 2019-07-21 DIAGNOSIS — O99320 Drug use complicating pregnancy, unspecified trimester: Secondary | ICD-10-CM

## 2019-07-21 DIAGNOSIS — Z3480 Encounter for supervision of other normal pregnancy, unspecified trimester: Secondary | ICD-10-CM

## 2019-07-21 DIAGNOSIS — Z23 Encounter for immunization: Secondary | ICD-10-CM | POA: Diagnosis not present

## 2019-07-21 DIAGNOSIS — F1120 Opioid dependence, uncomplicated: Secondary | ICD-10-CM

## 2019-07-21 DIAGNOSIS — O34219 Maternal care for unspecified type scar from previous cesarean delivery: Secondary | ICD-10-CM

## 2019-07-21 MED ORDER — RHO D IMMUNE GLOBULIN 1500 UNIT/2ML IJ SOSY
300.0000 ug | PREFILLED_SYRINGE | Freq: Once | INTRAMUSCULAR | Status: AC
  Administered 2019-07-21: 300 ug via INTRAMUSCULAR

## 2019-07-21 NOTE — Progress Notes (Signed)
Pt is here today for Rhogam and TDAP injections. Tdap given in L deltoid without difficulty. RhoGam given in RUOQ without difficulty.

## 2019-08-03 ENCOUNTER — Ambulatory Visit (INDEPENDENT_AMBULATORY_CARE_PROVIDER_SITE_OTHER): Payer: Medicaid Other | Admitting: Obstetrics and Gynecology

## 2019-08-03 ENCOUNTER — Other Ambulatory Visit: Payer: Self-pay

## 2019-08-03 ENCOUNTER — Other Ambulatory Visit (HOSPITAL_COMMUNITY)
Admission: RE | Admit: 2019-08-03 | Discharge: 2019-08-03 | Disposition: A | Discharge status: Home / Self Care | Payer: Medicaid Other | Source: Ambulatory Visit | Attending: Obstetrics and Gynecology | Admitting: Obstetrics and Gynecology

## 2019-08-03 ENCOUNTER — Encounter: Payer: Self-pay | Admitting: Obstetrics and Gynecology

## 2019-08-03 VITALS — BP 103/70 | HR 93 | Wt 211.7 lb

## 2019-08-03 DIAGNOSIS — O99323 Drug use complicating pregnancy, third trimester: Secondary | ICD-10-CM

## 2019-08-03 DIAGNOSIS — O360130 Maternal care for anti-D [Rh] antibodies, third trimester, not applicable or unspecified: Secondary | ICD-10-CM

## 2019-08-03 DIAGNOSIS — Z3480 Encounter for supervision of other normal pregnancy, unspecified trimester: Secondary | ICD-10-CM | POA: Diagnosis present

## 2019-08-03 DIAGNOSIS — Z23 Encounter for immunization: Secondary | ICD-10-CM | POA: Diagnosis not present

## 2019-08-03 DIAGNOSIS — O26899 Other specified pregnancy related conditions, unspecified trimester: Secondary | ICD-10-CM

## 2019-08-03 DIAGNOSIS — F1120 Opioid dependence, uncomplicated: Secondary | ICD-10-CM

## 2019-08-03 DIAGNOSIS — O34219 Maternal care for unspecified type scar from previous cesarean delivery: Secondary | ICD-10-CM

## 2019-08-03 DIAGNOSIS — Z3A36 36 weeks gestation of pregnancy: Secondary | ICD-10-CM

## 2019-08-03 DIAGNOSIS — Z6791 Unspecified blood type, Rh negative: Secondary | ICD-10-CM

## 2019-08-03 MED ORDER — GABAPENTIN 600 MG PO TABS: 600.0000 mg | ORAL_TABLET | Freq: Three times a day (TID) | ORAL | 2 refills | Status: AC

## 2019-08-03 NOTE — Progress Notes (Signed)
   PRENATAL VISIT NOTE  Subjective:  Brittany Cortez is a 33 y.o. G3P2002 at [redacted]w[redacted]d being seen today for ongoing prenatal care.  She is currently monitored for the following issues for this high-risk pregnancy and has Asthma; Hx of narcotic abuse; Severe alcohol use disorder ([place]); Opioid dependence ([place]); ADHD (attention deficit hyperactivity disorder); Substance induced mood disorder ([place]); Smoker; Supervision of other normal pregnancy, antepartum; Pregnancy complicated by subutex maintenance, antepartum ([place]); and History of cesarean delivery, currently pregnant on their problem list.  Patient reports no complaints.Contractions: Not present. Vag. Bleeding: None.  Movement: Present. Denies leaking of fluid. Mood has been stable.   The following portions of the patient's history were reviewed and updated as appropriate: allergies, current medications, past family history, past medical history, past social history, past surgical history and problem list.   Objective:   Vitals:   08/03/19 0830  BP: 103/70  Pulse: 93  Weight: 211 lb 11.2 oz (96 kg)    Fetal Status: Fetal Heart Rate (bpm): 131   Movement: Present     General:  Alert, oriented and cooperative. Patient is in no acute distress.  Skin: Skin is warm and dry. No rash noted.   Cardiovascular: Normal heart rate noted  Respiratory: Normal respiratory effort, no problems with respiration noted  Abdomen: Soft, gravid, appropriate for gestational age.  Pain/Pressure: Present     Pelvic: Cervical exam deferred        Extremities: Normal range of motion.  Edema: Trace  Mental Status: Normal mood and affect. Normal behavior. Normal judgment and thought content.   Assessment and Plan:  Pregnancy: G3P2002 at [redacted]w[redacted]d  1. Supervision of other normal pregnancy, antepartum Reconsidering BTL, reviewed options, she is considering PP IUD  2. History of cesarean delivery, currently pregnant Reviewed risks/benefits of TOLAC versus RCS in detail.  Patient counseled regarding potential vaginal delivery, chance of success, future implications, possible uterine rupture and need for urgent/emergent repeat cesarean. Counseled regarding potential need for repeat c-section for reasons unrelated to first c-section. Counseled regarding scheduled repeat cesarean including risks of bleeding, infection, damage to surrounding tissue, abnormal placentation, implications for future pregnancies. All questions answered.  Patient desires TOLAC, consent signed 08/03/2019.  3. Rh negative, antepartum Rho gam pp  4. Pregnancy complicated by subutex maintenance, antepartum ([place]) Cont subutex, wellbutrin, gabapentin Will arrange interview/phone call with NICU  Preterm labor symptoms and general obstetric precautions including but not limited to vaginal bleeding, contractions, leaking of fluid and fetal movement were reviewed in detail with the patient. Please refer to After Visit Summary for other counseling recommendations.   Return in about 1 week (around 08/10/2019) for high OB, in person.  Future Appointments  Date Time Provider [place]  08/04/2019  9:30 AM [place] NURSE [facility]-US  08/04/2019  9:30 AM [place] 1 WH-MFCUS MFC-US    Brittany Leiter, MD

## 2019-08-03 NOTE — Progress Notes (Signed)
Pt presents for ROB/GBS/GC/CT.  Needs rf on Gabapentin Flu vaccine offered; pt declined.

## 2019-08-04 ENCOUNTER — Encounter (HOSPITAL_COMMUNITY): Payer: Self-pay | Admitting: *Deleted

## 2019-08-04 ENCOUNTER — Ambulatory Visit (HOSPITAL_COMMUNITY)
Admission: RE | Admit: 2019-08-04 | Discharge: 2019-08-04 | Disposition: A | Discharge status: Home / Self Care | Payer: Medicaid Other | Source: Ambulatory Visit | Attending: Obstetrics and Gynecology | Admitting: Obstetrics and Gynecology

## 2019-08-04 ENCOUNTER — Ambulatory Visit (HOSPITAL_COMMUNITY): Payer: Medicaid Other | Admitting: *Deleted

## 2019-08-04 DIAGNOSIS — O34219 Maternal care for unspecified type scar from previous cesarean delivery: Secondary | ICD-10-CM | POA: Diagnosis present

## 2019-08-04 DIAGNOSIS — O99320 Drug use complicating pregnancy, unspecified trimester: Secondary | ICD-10-CM

## 2019-08-04 DIAGNOSIS — Z362 Encounter for other antenatal screening follow-up: Secondary | ICD-10-CM | POA: Diagnosis not present

## 2019-08-04 DIAGNOSIS — Z3A36 36 weeks gestation of pregnancy: Secondary | ICD-10-CM

## 2019-08-04 DIAGNOSIS — F1120 Opioid dependence, uncomplicated: Secondary | ICD-10-CM

## 2019-08-04 DIAGNOSIS — J45909 Unspecified asthma, uncomplicated: Secondary | ICD-10-CM

## 2019-08-04 DIAGNOSIS — O99313 Alcohol use complicating pregnancy, third trimester: Secondary | ICD-10-CM

## 2019-08-04 DIAGNOSIS — O99333 Smoking (tobacco) complicating pregnancy, third trimester: Secondary | ICD-10-CM

## 2019-08-04 DIAGNOSIS — O99323 Drug use complicating pregnancy, third trimester: Secondary | ICD-10-CM

## 2019-08-04 DIAGNOSIS — O99891 Other specified diseases and conditions complicating pregnancy: Secondary | ICD-10-CM

## 2019-08-04 LAB — CERVICOVAGINAL ANCILLARY ONLY
Chlamydia: NEGATIVE
Comment: NEGATIVE
Comment: NORMAL
Neisseria Gonorrhea: NEGATIVE

## 2019-08-07 LAB — CULTURE, BETA STREP (GROUP B ONLY): Strep Gp B Culture: NEGATIVE

## 2019-08-07 LAB — OB RESULTS CONSOLE GBS: GBS: NEGATIVE

## 2019-08-10 ENCOUNTER — Telehealth: Payer: Self-pay | Admitting: Pediatrics

## 2019-08-10 ENCOUNTER — Other Ambulatory Visit: Payer: Self-pay

## 2019-08-10 ENCOUNTER — Encounter: Payer: Self-pay | Admitting: Obstetrics and Gynecology

## 2019-08-10 ENCOUNTER — Telehealth (INDEPENDENT_AMBULATORY_CARE_PROVIDER_SITE_OTHER): Payer: Medicaid Other | Admitting: Obstetrics and Gynecology

## 2019-08-10 VITALS — BP 113/70

## 2019-08-10 DIAGNOSIS — O360930 Maternal care for other rhesus isoimmunization, third trimester, not applicable or unspecified: Secondary | ICD-10-CM

## 2019-08-10 DIAGNOSIS — O26893 Other specified pregnancy related conditions, third trimester: Secondary | ICD-10-CM

## 2019-08-10 DIAGNOSIS — F1120 Opioid dependence, uncomplicated: Secondary | ICD-10-CM

## 2019-08-10 DIAGNOSIS — O99323 Drug use complicating pregnancy, third trimester: Secondary | ICD-10-CM

## 2019-08-10 DIAGNOSIS — Z3A37 37 weeks gestation of pregnancy: Secondary | ICD-10-CM

## 2019-08-10 DIAGNOSIS — O34219 Maternal care for unspecified type scar from previous cesarean delivery: Secondary | ICD-10-CM

## 2019-08-10 DIAGNOSIS — Z6791 Unspecified blood type, Rh negative: Secondary | ICD-10-CM | POA: Insufficient documentation

## 2019-08-10 DIAGNOSIS — Z3480 Encounter for supervision of other normal pregnancy, unspecified trimester: Secondary | ICD-10-CM

## 2019-08-10 DIAGNOSIS — O26899 Other specified pregnancy related conditions, unspecified trimester: Secondary | ICD-10-CM | POA: Insufficient documentation

## 2019-08-10 DIAGNOSIS — O99320 Drug use complicating pregnancy, unspecified trimester: Secondary | ICD-10-CM

## 2019-08-10 DIAGNOSIS — Z3009 Encounter for other general counseling and advice on contraception: Secondary | ICD-10-CM | POA: Insufficient documentation

## 2019-08-10 NOTE — Progress Notes (Signed)
TELEHEALTH OBSTETRICS PRENATAL VIRTUAL VIDEO VISIT ENCOUNTER NOTE  Provider location: [facility] [employer] [facility] at [place]   I connected with Brittany Cortez on 08/10/19 at 11:15 AM EST by WebEx Video Encounter at home and verified that I am speaking with the correct person using two identifiers.   I discussed the limitations, risks, security and privacy concerns of performing an evaluation and management service virtually and the availability of in person appointments. I also discussed with the patient that there may be a patient responsible charge related to this service. The patient expressed understanding and agreed to proceed. Subjective:  Brittany Cortez is a 33 y.o. G3P2002 at [redacted]w[redacted]d being seen today for ongoing prenatal care.  She is currently monitored for the following issues for this high-risk pregnancy and has Asthma; Hx of narcotic abuse; Severe alcohol use disorder (HCC); Opioid dependence (HCC); ADHD (attention deficit hyperactivity disorder); Substance induced mood disorder (HCC); Smoker; Supervision of other normal pregnancy, antepartum; Pregnancy complicated by subutex maintenance, antepartum (HCC); History of cesarean delivery, currently pregnant; Rh negative status during pregnancy; and Unwanted fertility on their problem list.  Patient reports general discomforts of pregnancy.  Contractions: Irritability. Vag. Bleeding: None.  Movement: Present. Denies any leaking of fluid.   The following portions of the patient's history were reviewed and updated as appropriate: allergies, current medications, past family history, past medical history, past social history, past surgical history and problem list.   Objective:   Vitals:   08/10/19 1122  BP: 113/70    Fetal Status:     Movement: Present     General:  Alert, oriented and cooperative. Patient is in no acute distress.  Respiratory: Normal respiratory effort, no problems with respiration noted  Mental Status: Normal mood  and affect. Normal behavior. Normal judgment and thought content.  Rest of physical exam deferred due to type of encounter  Imaging: [place] Mfm Ob Follow Up  Result Date: 08/04/2019 ----------------------------------------------------------------------  OBSTETRICS REPORT                       (Signed Final 08/04/2019 10:28 am) ---------------------------------------------------------------------- Patient Info  ID #:       [id number]                          D.O.B.:  10/01/1986 (33 yrs)  Name:       Brittany Cortez                   Visit Date: 08/04/2019 10:18 am ---------------------------------------------------------------------- Performed By  Performed By:     Emeline Darling BS,      Ref. Address:     [address]  [address]                                                             [place]                                                             [id number]  Attending:        Noralee Spaceavi Shankar MD        [facility]  Referred By:      [facility] ---------------------------------------------------------------------- Orders   #  Description                          Code         Ordered By   1  US MFM OB FOLLOW UP                  [id number]     [facility]  ----------------------------------------------------------------------   #  Order #                    Accession #                 Episode #   1  [id number]                  [id number]                  [id number]  ---------------------------------------------------------------------- Indications   [redacted] weeks gestation of pregnancy                Z3A.36   Encounter for other antenatal screening        Z36.2   follow-up   Low Risk NIPS   Pregnancy comlicated by subutex                O99.320, F11.20,   maintenance, antepartum                         Z79.891   Previous cesarean delivery, antepartum         O34.219   Tobacco use complicating pregnancy, third      O99.333   trimester   Asthma                                         O99.89 j45.909   Alcohol use complicating pregnancy, third      O99.313   trimester  ---------------------------------------------------------------------- Vital Signs  Height:        5'7" ---------------------------------------------------------------------- Fetal Evaluation  Num Of Fetuses:         1  Fetal Heart Rate(bpm):  122  Cardiac Activity:       Observed  Presentation:           Cephalic  Placenta:               Anterior  P. Cord Insertion:      Previously Visualized  Amniotic Fluid  AFI FV:      Within normal limits  AFI Sum(cm)     %Tile       Largest Pocket(cm)  13.65           50          4.2  RUQ(cm)       RLQ(cm)       LUQ(cm)        LLQ(cm)  4.2           2             3.43           4.02 ---------------------------------------------------------------------- Biometry  BPD:      86.1  mm     G. Age:  34w 5d         18  %    CI:        75.72   %    70 - 86                                                          FL/HC:      21.3   %    20.1 - 22.1  HC:      313.7  mm     G. Age:  35w 1d          6  %    HC/AC:      1.00        0.93 - 1.11  AC:       314   mm     G. Age:  35w 2d         33  %    FL/BPD:     77.7   %    71 - 87  FL:       66.9  mm     G. Age:  34w 3d          8  %    FL/AC:      21.3   %    20 - 24  Est. FW:    2570  gm    5 lb 11 oz      20  % ---------------------------------------------------------------------- OB History  Gravidity:    3         Term:   2        Prem:   0        SAB:   0  TOP:          0       Ectopic:  0        Living: 2 ---------------------------------------------------------------------- Gestational Age  U/S Today:     34w 6d  EDD:   09/09/19  Best:          36w 2d     Det. By:   U/S  (03/24/19)          EDD:   08/30/19 ---------------------------------------------------------------------- Anatomy  Cranium:               Appears normal         LVOT:                   Previously seen  Cavum:                 Previously seen        Aortic Arch:            Previously seen  Ventricles:            Previously seen        Ductal Arch:            Previously seen  Choroid Plexus:        Previously seen        Diaphragm:              Previously seen  Cerebellum:            Previously seen        Stomach:                Appears normal, left                                                                        sided  Posterior Fossa:       Previously seen        Abdomen:                Appears normal  Nuchal Fold:           Previously seen        Abdominal Wall:         Previously seen  Face:                  Orbits and profile     Cord Vessels:           Previously seen                         previously seen  Lips:                  Previously seen        Kidneys:                Appear normal  Palate:                Previously seen        Bladder:                Appears normal  Thoracic:              Appears normal         Spine:                  Previously seen  Heart:  Appears normal         Upper Extremities:      Previously seen                         (4CH, axis, and                         situs)  RVOT:                  Previously seen        Lower Extremities:      Previously seen  Other:  Nasal bone, Heels and 5th digits previously visualized. Technically          difficult due to fetal position. ---------------------------------------------------------------------- Cervix Uterus Adnexa  Cervix  Not visualized (advanced GA >24wks) ---------------------------------------------------------------------- Impression  Subutex maintenance.  Fetal growth is appropriate for gestational age. Amniotic fluid  is normal and good fetal activity is seen.  We reassured the patient of the  findings. ---------------------------------------------------------------------- Recommendations  Follow-up scans as clinically indicated. ----------------------------------------------------------------------                  Tama High, MD Electronically Signed Final Report   08/04/2019 10:28 am ----------------------------------------------------------------------   Assessment and Plan:  Pregnancy: J4H7026 at [redacted]w[redacted]d 1. Supervision of other normal pregnancy, antepartum Stable Labor precautions  2. Pregnancy complicated by subutex maintenance, antepartum ([place]) Stable NICU consult today  3. History of cesarean delivery, currently pregnant Desires TOLAC, consent signed  4. Rh negative status during pregnancy in third trimester Rhogam PP as indicated  5. Unwanted fertility Has signed BTL papers but is also considering PP IUD  Term labor symptoms and general obstetric precautions including but not limited to vaginal bleeding, contractions, leaking of fluid and fetal movement were reviewed in detail with the patient. I discussed the assessment and treatment plan with the patient. The patient was provided an opportunity to ask questions and all were answered. The patient agreed with the plan and demonstrated an understanding of the instructions. The patient was advised to call back or seek an in-person office evaluation/go to MAU at [facility] for any urgent or concerning symptoms. Please refer to After Visit Summary for other counseling recommendations.   I provided 8 minutes of face-to-face time during this encounter.  Return in about 1 week (around 08/17/2019) for OB visit, face to face.  Future Appointments  Date Time Provider [place]  08/17/2019  9:00 AM Constant, Vickii Chafe, MD [place] None  08/24/2019  9:30 AM Sloan Leiter, MD [facility] None  08/30/2019  8:45 AM Lavonia Drafts, MD [place] None    Chancy Milroy, [place] for [facility], [place]

## 2019-08-10 NOTE — Progress Notes (Signed)
Pt is on the phone preparing for virtual visit with provider. [redacted]w[redacted]d.

## 2019-08-17 ENCOUNTER — Other Ambulatory Visit: Payer: Self-pay

## 2019-08-17 ENCOUNTER — Encounter: Payer: Self-pay | Admitting: Obstetrics and Gynecology

## 2019-08-17 ENCOUNTER — Ambulatory Visit (INDEPENDENT_AMBULATORY_CARE_PROVIDER_SITE_OTHER): Payer: Medicaid Other | Admitting: Obstetrics and Gynecology

## 2019-08-17 VITALS — BP 117/70 | HR 89 | Wt 209.0 lb

## 2019-08-17 DIAGNOSIS — Z3A38 38 weeks gestation of pregnancy: Secondary | ICD-10-CM

## 2019-08-17 DIAGNOSIS — Z6791 Unspecified blood type, Rh negative: Secondary | ICD-10-CM

## 2019-08-17 DIAGNOSIS — F1120 Opioid dependence, uncomplicated: Secondary | ICD-10-CM

## 2019-08-17 DIAGNOSIS — O34219 Maternal care for unspecified type scar from previous cesarean delivery: Secondary | ICD-10-CM

## 2019-08-17 DIAGNOSIS — Z3009 Encounter for other general counseling and advice on contraception: Secondary | ICD-10-CM

## 2019-08-17 DIAGNOSIS — O26893 Other specified pregnancy related conditions, third trimester: Secondary | ICD-10-CM

## 2019-08-17 DIAGNOSIS — O99323 Drug use complicating pregnancy, third trimester: Secondary | ICD-10-CM

## 2019-08-17 DIAGNOSIS — Z3480 Encounter for supervision of other normal pregnancy, unspecified trimester: Secondary | ICD-10-CM

## 2019-08-17 NOTE — Progress Notes (Signed)
   PRENATAL VISIT NOTE  Subjective:  Brittany Cortez is a 33 y.o. G3P2002 at [redacted]w[redacted]d being seen today for ongoing prenatal care.  She is currently monitored for the following issues for this high-risk pregnancy and has Asthma; Hx of narcotic abuse; Severe alcohol use disorder ([place]); Opioid dependence ([place]); ADHD (attention deficit hyperactivity disorder); Substance induced mood disorder ([place]); Smoker; Supervision of other normal pregnancy, antepartum; Pregnancy complicated by subutex maintenance, antepartum ([place]); History of cesarean delivery, currently pregnant; Rh negative status during pregnancy; and Unwanted fertility on their problem list.  Patient reports no complaints.  Contractions: Irregular. Vag. Bleeding: None.  Movement: Present. Denies leaking of fluid.   The following portions of the patient's history were reviewed and updated as appropriate: allergies, current medications, past family history, past medical history, past social history, past surgical history and problem list.   Objective:   Vitals:   08/17/19 0902  BP: 117/70  Pulse: 89  Weight: 209 lb (94.8 kg)    Fetal Status: Fetal Heart Rate (bpm): 145 Fundal Height: 38 cm Movement: Present  Presentation: Vertex  General:  Alert, oriented and cooperative. Patient is in no acute distress.  Skin: Skin is warm and dry. No rash noted.   Cardiovascular: Normal heart rate noted  Respiratory: Normal respiratory effort, no problems with respiration noted  Abdomen: Soft, gravid, appropriate for gestational age.  Pain/Pressure: Present     Pelvic: Cervical exam deferred Dilation: 1 Effacement (%): Thick Station: -3  Extremities: Normal range of motion.     Mental Status: Normal mood and affect. Normal behavior. Normal judgment and thought content.   Assessment and Plan:  Pregnancy: G3P2002 at [redacted]w[redacted]d 1. Supervision of other normal pregnancy, antepartum Patient is doing well without complaints  2. History of cesarean delivery,  currently pregnant Desires TOLAC  3. Unwanted fertility Patient is uncertain of BTL and is considering IUD  4. Rh negative status during pregnancy in third trimester S/p rhogam  5. Pregnancy complicated by subutex maintenance, antepartum ([place]) Continue at current dose  Term labor symptoms and general obstetric precautions including but not limited to vaginal bleeding, contractions, leaking of fluid and fetal movement were reviewed in detail with the patient. Please refer to After Visit Summary for other counseling recommendations.   Return in about 1 week (around 08/24/2019) for Virtual, ROB, High risk.  Future Appointments  Date Time Provider [place]  08/24/2019  9:30 AM Sloan Leiter, MD [facility] None  08/30/2019  8:45 AM Lavonia Drafts, MD [facility] None    Mora Bellman, MD

## 2019-08-24 ENCOUNTER — Encounter: Payer: Medicaid Other | Admitting: Obstetrics and Gynecology

## 2019-08-25 ENCOUNTER — Encounter: Payer: Self-pay | Admitting: Obstetrics and Gynecology

## 2019-08-25 ENCOUNTER — Telehealth (INDEPENDENT_AMBULATORY_CARE_PROVIDER_SITE_OTHER): Payer: Medicaid Other | Admitting: Obstetrics and Gynecology

## 2019-08-25 DIAGNOSIS — O34219 Maternal care for unspecified type scar from previous cesarean delivery: Secondary | ICD-10-CM

## 2019-08-25 DIAGNOSIS — Z3009 Encounter for other general counseling and advice on contraception: Secondary | ICD-10-CM

## 2019-08-25 DIAGNOSIS — Z3A39 39 weeks gestation of pregnancy: Secondary | ICD-10-CM

## 2019-08-25 DIAGNOSIS — O26893 Other specified pregnancy related conditions, third trimester: Secondary | ICD-10-CM

## 2019-08-25 DIAGNOSIS — Z6791 Unspecified blood type, Rh negative: Secondary | ICD-10-CM

## 2019-08-25 DIAGNOSIS — Z3480 Encounter for supervision of other normal pregnancy, unspecified trimester: Secondary | ICD-10-CM

## 2019-08-25 DIAGNOSIS — F1120 Opioid dependence, uncomplicated: Secondary | ICD-10-CM

## 2019-08-25 DIAGNOSIS — O99323 Drug use complicating pregnancy, third trimester: Secondary | ICD-10-CM

## 2019-08-25 MED ORDER — BUPROPION HCL ER (XL) 150 MG PO TB24: 150.0000 mg | ORAL_TABLET | Freq: Every day | ORAL | 3 refills | Status: AC

## 2019-08-25 NOTE — Progress Notes (Signed)
TELEHEALTH OBSTETRICS PRENATAL VIRTUAL VIDEO VISIT ENCOUNTER NOTE  Provider location: [facility] [employer] at [place]   I connected with Brittany Cortez on 08/25/19 at  9:00 AM EST by WebEx Encounter at home and verified that I am speaking with the correct person using two identifiers.   I discussed the limitations, risks, security and privacy concerns of performing an evaluation and management service virtually and the availability of in person appointments. I also discussed with the patient that there may be a patient responsible charge related to this service. The patient expressed understanding and agreed to proceed. Subjective:  Brittany Cortez is a 33 y.o. G3P2002 at [redacted]w[redacted]d being seen today for ongoing prenatal care.  She is currently monitored for the following issues for this high-risk pregnancy and has Asthma; Hx of narcotic abuse; Severe alcohol use disorder (HCC); Opioid dependence (HCC); ADHD (attention deficit hyperactivity disorder); Substance induced mood disorder (HCC); Smoker; Supervision of other normal pregnancy, antepartum; Pregnancy complicated by subutex maintenance, antepartum (HCC); History of cesarean delivery, currently pregnant; Rh negative status during pregnancy; and Unwanted fertility on their problem list.  Patient reports general discomforts of pregnancy.  Contractions: Irritability. Vag. Bleeding: None.  Movement: Present. Denies any leaking of fluid.   The following portions of the patient's history were reviewed and updated as appropriate: allergies, current medications, past family history, past medical history, past social history, past surgical history and problem list.   Objective:  There were no vitals filed for this visit.  Fetal Status:     Movement: Present     General:  Alert, oriented and cooperative. Patient is in no acute distress.  Respiratory: Normal respiratory effort, no problems with respiration noted  Mental Status: Normal mood and  affect. Normal behavior. Normal judgment and thought content.  Rest of physical exam deferred due to type of encounter  Imaging: [place] Mfm Ob Follow Up  Result Date: 08/04/2019 ----------------------------------------------------------------------  OBSTETRICS REPORT                       (Signed Final 08/04/2019 10:28 am) ---------------------------------------------------------------------- Patient Info  ID #:       [id number]                          D.O.B.:  21-Jan-1986 (33 yrs)  Name:       Brittany Cortez                   Visit Date: 08/04/2019 10:18 am ---------------------------------------------------------------------- Performed By  Performed By:     Emeline Darling BS,      Ref. Address:     [address]  [address]                                                             [place]                                                             [phone]  Attending:        Noralee Spaceavi Shankar MD        [facility]  Referred By:      [facility] ---------------------------------------------------------------------- Orders   #  Description                          Code         Ordered By   1  US MFM OB FOLLOW UP                  [id number]     [facility]  ----------------------------------------------------------------------   #  Order #                    Accession #                 Episode #   1  [id number]                  [id number]                  [id number]  ---------------------------------------------------------------------- Indications   [redacted] weeks gestation of pregnancy                Z3A.36   Encounter for other antenatal screening        Z36.2   follow-up   Low Risk NIPS   Pregnancy comlicated by subutex                O99.320, F11.20,   maintenance, antepartum                         Z79.891   Previous cesarean delivery, antepartum         O34.219   Tobacco use complicating pregnancy, third      O99.333   trimester   Asthma                                         O99.89 j45.909   Alcohol use complicating pregnancy, third      O99.313   trimester  ---------------------------------------------------------------------- Vital Signs  Height:        5'7" ---------------------------------------------------------------------- Fetal Evaluation  Num Of Fetuses:         1  Fetal Heart Rate(bpm):  122  Cardiac Activity:       Observed  Presentation:           Cephalic  Placenta:               Anterior  P. Cord Insertion:      Previously Visualized  Amniotic Fluid  AFI FV:      Within normal limits  AFI Sum(cm)     %Tile       Largest Pocket(cm)  13.65           50          4.2  RUQ(cm)       RLQ(cm)       LUQ(cm)        LLQ(cm)  4.2           2             3.43           4.02 ---------------------------------------------------------------------- Biometry  BPD:      86.1  mm     G. Age:  34w 5d         18  %    CI:        75.72   %    70 - 86                                                          FL/HC:      21.3   %    20.1 - 22.1  HC:      313.7  mm     G. Age:  35w 1d          6  %    HC/AC:      1.00        0.93 - 1.11  AC:       314   mm     G. Age:  35w 2d         33  %    FL/BPD:     77.7   %    71 - 87  FL:       66.9  mm     G. Age:  34w 3d          8  %    FL/AC:      21.3   %    20 - 24  Est. FW:    2570  gm    5 lb 11 oz      20  % ---------------------------------------------------------------------- OB History  Gravidity:    3         Term:   2        Prem:   0        SAB:   0  TOP:          0       Ectopic:  0        Living: 2 ---------------------------------------------------------------------- Gestational Age  U/S Today:     34w 6d  EDD:   09/09/19  Best:          36w 2d     Det. By:  U/S   (03/24/19)          EDD:   08/30/19 ---------------------------------------------------------------------- Anatomy  Cranium:               Appears normal         LVOT:                   Previously seen  Cavum:                 Previously seen        Aortic Arch:            Previously seen  Ventricles:            Previously seen        Ductal Arch:            Previously seen  Choroid Plexus:        Previously seen        Diaphragm:              Previously seen  Cerebellum:            Previously seen        Stomach:                Appears normal, left                                                                        sided  Posterior Fossa:       Previously seen        Abdomen:                Appears normal  Nuchal Fold:           Previously seen        Abdominal Wall:         Previously seen  Face:                  Orbits and profile     Cord Vessels:           Previously seen                         previously seen  Lips:                  Previously seen        Kidneys:                Appear normal  Palate:                Previously seen        Bladder:                Appears normal  Thoracic:              Appears normal         Spine:                  Previously seen  Heart:  Appears normal         Upper Extremities:      Previously seen                         (4CH, axis, and                         situs)  RVOT:                  Previously seen        Lower Extremities:      Previously seen  Other:  Nasal bone, Heels and 5th digits previously visualized. Technically          difficult due to fetal position. ---------------------------------------------------------------------- Cervix Uterus Adnexa  Cervix  Not visualized (advanced GA >24wks) ---------------------------------------------------------------------- Impression  Subutex maintenance.  Fetal growth is appropriate for gestational age. Amniotic fluid  is normal and good fetal activity is seen.  We reassured the patient of the findings.  ---------------------------------------------------------------------- Recommendations  Follow-up scans as clinically indicated. ----------------------------------------------------------------------                  Noralee Space, MD Electronically Signed Final Report   08/04/2019 10:28 am ----------------------------------------------------------------------   Assessment and Plan:  Pregnancy: Z6X0960 at 104w2d 1. Supervision of other normal pregnancy, antepartum Stable NST next week d/t post dates  2. Pregnancy complicated by subutex maintenance, antepartum (HCC) Stable Catapress refilled  3. Rh negative status during pregnancy in third trimester S/P Rhogam  4. Unwanted fertility BTL papers signed but considering PP IUD  5. History of cesarean delivery, currently pregnant TOLAC consent signed  Term labor symptoms and general obstetric precautions including but not limited to vaginal bleeding, contractions, leaking of fluid and fetal movement were reviewed in detail with the patient. I discussed the assessment and treatment plan with the patient. The patient was provided an opportunity to ask questions and all were answered. The patient agreed with the plan and demonstrated an understanding of the instructions. The patient was advised to call back or seek an in-person office evaluation/go to MAU at [facility] for any urgent or concerning symptoms. Please refer to After Visit Summary for other counseling recommendations.   I provided 8 minutes of face-to-face time during this encounter.  No follow-ups on file.  Future Appointments  Date Time Provider Department Center  08/30/2019  8:45 AM Willodean Rosenthal, MD [facility] None    Hermina Staggers, MD Center for [facility], [facility]

## 2019-08-25 NOTE — Progress Notes (Signed)
Pt have no compliants.  Requesting refills of gabapentin and Wellbutrin.

## 2019-08-30 ENCOUNTER — Other Ambulatory Visit: Payer: Self-pay | Admitting: Family Medicine

## 2019-08-30 ENCOUNTER — Inpatient Hospital Stay (HOSPITAL_COMMUNITY): Payer: Medicaid Other | Admitting: Anesthesiology

## 2019-08-30 ENCOUNTER — Other Ambulatory Visit: Payer: Self-pay

## 2019-08-30 ENCOUNTER — Inpatient Hospital Stay (HOSPITAL_COMMUNITY)
Admission: AD | Admit: 2019-08-30 | Discharge: 2019-09-02 | DRG: 806 | Disposition: A | Discharge status: Home / Self Care | Payer: Medicaid Other | Attending: Obstetrics and Gynecology | Admitting: Obstetrics and Gynecology

## 2019-08-30 ENCOUNTER — Ambulatory Visit (INDEPENDENT_AMBULATORY_CARE_PROVIDER_SITE_OTHER): Payer: Medicaid Other | Admitting: Obstetrics & Gynecology

## 2019-08-30 ENCOUNTER — Encounter (HOSPITAL_COMMUNITY): Payer: Self-pay

## 2019-08-30 VITALS — BP 108/69 | HR 89 | Wt 215.7 lb

## 2019-08-30 DIAGNOSIS — F1020 Alcohol dependence, uncomplicated: Secondary | ICD-10-CM

## 2019-08-30 DIAGNOSIS — O34219 Maternal care for unspecified type scar from previous cesarean delivery: Secondary | ICD-10-CM | POA: Diagnosis present

## 2019-08-30 DIAGNOSIS — F1124 Opioid dependence with opioid-induced mood disorder: Secondary | ICD-10-CM | POA: Diagnosis present

## 2019-08-30 DIAGNOSIS — O481 Prolonged pregnancy: Secondary | ICD-10-CM

## 2019-08-30 DIAGNOSIS — O26893 Other specified pregnancy related conditions, third trimester: Secondary | ICD-10-CM | POA: Diagnosis present

## 2019-08-30 DIAGNOSIS — O360930 Maternal care for other rhesus isoimmunization, third trimester, not applicable or unspecified: Secondary | ICD-10-CM

## 2019-08-30 DIAGNOSIS — Z3A40 40 weeks gestation of pregnancy: Secondary | ICD-10-CM

## 2019-08-30 DIAGNOSIS — O99323 Drug use complicating pregnancy, third trimester: Secondary | ICD-10-CM

## 2019-08-30 DIAGNOSIS — F17210 Nicotine dependence, cigarettes, uncomplicated: Secondary | ICD-10-CM | POA: Diagnosis present

## 2019-08-30 DIAGNOSIS — Z30430 Encounter for insertion of intrauterine contraceptive device: Secondary | ICD-10-CM | POA: Diagnosis not present

## 2019-08-30 DIAGNOSIS — Z6791 Unspecified blood type, Rh negative: Secondary | ICD-10-CM

## 2019-08-30 DIAGNOSIS — O368390 Maternal care for abnormalities of the fetal heart rate or rhythm, unspecified trimester, not applicable or unspecified: Secondary | ICD-10-CM | POA: Diagnosis present

## 2019-08-30 DIAGNOSIS — F1120 Opioid dependence, uncomplicated: Secondary | ICD-10-CM

## 2019-08-30 DIAGNOSIS — F1994 Other psychoactive substance use, unspecified with psychoactive substance-induced mood disorder: Secondary | ICD-10-CM | POA: Diagnosis present

## 2019-08-30 DIAGNOSIS — O99344 Other mental disorders complicating childbirth: Secondary | ICD-10-CM | POA: Diagnosis present

## 2019-08-30 DIAGNOSIS — O99334 Smoking (tobacco) complicating childbirth: Secondary | ICD-10-CM | POA: Diagnosis present

## 2019-08-30 DIAGNOSIS — Z3689 Encounter for other specified antenatal screening: Secondary | ICD-10-CM

## 2019-08-30 DIAGNOSIS — O99324 Drug use complicating childbirth: Secondary | ICD-10-CM | POA: Diagnosis present

## 2019-08-30 DIAGNOSIS — Z975 Presence of (intrauterine) contraceptive device: Secondary | ICD-10-CM

## 2019-08-30 DIAGNOSIS — Z20828 Contact with and (suspected) exposure to other viral communicable diseases: Secondary | ICD-10-CM | POA: Diagnosis present

## 2019-08-30 DIAGNOSIS — J45909 Unspecified asthma, uncomplicated: Secondary | ICD-10-CM | POA: Diagnosis present

## 2019-08-30 DIAGNOSIS — Z3480 Encounter for supervision of other normal pregnancy, unspecified trimester: Secondary | ICD-10-CM

## 2019-08-30 DIAGNOSIS — O9952 Diseases of the respiratory system complicating childbirth: Secondary | ICD-10-CM | POA: Diagnosis present

## 2019-08-30 DIAGNOSIS — F1110 Opioid abuse, uncomplicated: Secondary | ICD-10-CM | POA: Diagnosis present

## 2019-08-30 DIAGNOSIS — O26899 Other specified pregnancy related conditions, unspecified trimester: Secondary | ICD-10-CM

## 2019-08-30 DIAGNOSIS — Z3009 Encounter for other general counseling and advice on contraception: Secondary | ICD-10-CM | POA: Diagnosis present

## 2019-08-30 DIAGNOSIS — O480 Post-term pregnancy: Secondary | ICD-10-CM | POA: Diagnosis not present

## 2019-08-30 LAB — CBC
HCT: 38.1 % (ref 36.0–46.0)
Hemoglobin: 12.2 g/dL (ref 12.0–15.0)
MCH: 29 pg (ref 26.0–34.0)
MCHC: 32 g/dL (ref 30.0–36.0)
MCV: 90.7 fL (ref 80.0–100.0)
Platelets: 309 10*3/uL (ref 150–400)
RBC: 4.2 MIL/uL (ref 3.87–5.11)
RDW: 12.8 % (ref 11.5–15.5)
WBC: 11.7 10*3/uL — ABNORMAL HIGH (ref 4.0–10.5)
nRBC: 0 % (ref 0.0–0.2)

## 2019-08-30 LAB — SARS CORONAVIRUS 2 BY RT PCR (HOSPITAL ORDER, PERFORMED IN ~~LOC~~ HOSPITAL LAB): SARS Coronavirus 2: NEGATIVE

## 2019-08-30 MED ORDER — LACTATED RINGERS IV SOLN
INTRAVENOUS | Status: DC
  Administered 2019-08-30 (×2): via INTRAVENOUS

## 2019-08-30 MED ORDER — LACTATED RINGERS IV SOLN
500.0000 mL | INTRAVENOUS | Status: DC | PRN
  Administered 2019-08-30 (×2): 500 mL via INTRAVENOUS

## 2019-08-30 MED ORDER — PHENYLEPHRINE 40 MCG/ML (10ML) SYRINGE FOR IV PUSH (FOR BLOOD PRESSURE SUPPORT): 80.0000 ug | PREFILLED_SYRINGE | INTRAVENOUS | Status: DC | PRN

## 2019-08-30 MED ORDER — ACETAMINOPHEN 500 MG PO TABS: 1000.0000 mg | ORAL_TABLET | Freq: Three times a day (TID) | ORAL | Status: DC | PRN

## 2019-08-30 MED ORDER — FLUTICASONE FUROATE-VILANTEROL 100-25 MCG/INH IN AEPB
1.0000 | INHALATION_SPRAY | Freq: Every day | RESPIRATORY_TRACT | Status: DC
  Administered 2019-08-31 – 2019-09-02 (×3): 1 via RESPIRATORY_TRACT
  Filled 2019-08-30: qty 28

## 2019-08-30 MED ORDER — SODIUM CHLORIDE (PF) 0.9 % IJ SOLN
INTRAMUSCULAR | Status: DC | PRN
  Administered 2019-08-30: 12 mL/h via EPIDURAL

## 2019-08-30 MED ORDER — SOD CITRATE-CITRIC ACID 500-334 MG/5ML PO SOLN: 30.0000 mL | ORAL | Status: DC | PRN

## 2019-08-30 MED ORDER — BUPROPION HCL ER (XL) 150 MG PO TB24
150.0000 mg | ORAL_TABLET | Freq: Every day | ORAL | Status: DC
  Administered 2019-08-31 – 2019-09-02 (×3): 150 mg via ORAL
  Filled 2019-08-30 (×4): qty 1

## 2019-08-30 MED ORDER — CLONIDINE HCL 0.1 MG PO TABS
0.1000 mg | ORAL_TABLET | Freq: Three times a day (TID) | ORAL | Status: DC
  Filled 2019-08-30 (×2): qty 1

## 2019-08-30 MED ORDER — OXYTOCIN BOLUS FROM INFUSION
500.0000 mL | Freq: Once | INTRAVENOUS | Status: AC
  Administered 2019-08-31: 500 mL via INTRAVENOUS

## 2019-08-30 MED ORDER — EPHEDRINE 5 MG/ML INJ: 10.0000 mg | INTRAVENOUS | Status: DC | PRN

## 2019-08-30 MED ORDER — FENTANYL CITRATE (PF) 100 MCG/2ML IJ SOLN
50.0000 ug | INTRAMUSCULAR | Status: DC | PRN
  Administered 2019-08-30: 100 ug via INTRAVENOUS

## 2019-08-30 MED ORDER — OXYTOCIN 40 UNITS IN NORMAL SALINE INFUSION - SIMPLE MED
1.0000 m[IU]/min | INTRAVENOUS | Status: DC
  Administered 2019-08-30: 2 m[IU]/min via INTRAVENOUS
  Filled 2019-08-30: qty 1000

## 2019-08-30 MED ORDER — FENTANYL-BUPIVACAINE-NACL 0.5-0.125-0.9 MG/250ML-% EP SOLN
12.0000 mL/h | EPIDURAL | Status: DC | PRN
  Filled 2019-08-30: qty 250

## 2019-08-30 MED ORDER — NICOTINE 14 MG/24HR TD PT24
14.0000 mg | MEDICATED_PATCH | Freq: Every day | TRANSDERMAL | Status: DC
  Administered 2019-08-30 – 2019-09-02 (×4): 14 mg via TRANSDERMAL
  Filled 2019-08-30 (×4): qty 1

## 2019-08-30 MED ORDER — ONDANSETRON HCL 4 MG/2ML IJ SOLN: 4.0000 mg | Freq: Four times a day (QID) | INTRAMUSCULAR | Status: DC | PRN

## 2019-08-30 MED ORDER — TERBUTALINE SULFATE 1 MG/ML IJ SOLN: 0.2500 mg | Freq: Once | INTRAMUSCULAR | Status: DC | PRN

## 2019-08-30 MED ORDER — OXYCODONE HCL 5 MG PO TABS: 5.0000 mg | ORAL_TABLET | Freq: Four times a day (QID) | ORAL | Status: DC | PRN

## 2019-08-30 MED ORDER — GABAPENTIN 600 MG PO TABS
600.0000 mg | ORAL_TABLET | Freq: Three times a day (TID) | ORAL | Status: DC
  Administered 2019-08-30 – 2019-09-02 (×10): 600 mg via ORAL
  Filled 2019-08-30 (×12): qty 1

## 2019-08-30 MED ORDER — FENTANYL CITRATE (PF) 100 MCG/2ML IJ SOLN
INTRAMUSCULAR | Status: AC
  Filled 2019-08-30: qty 2

## 2019-08-30 MED ORDER — DIPHENHYDRAMINE HCL 50 MG/ML IJ SOLN: 12.5000 mg | INTRAMUSCULAR | Status: DC | PRN

## 2019-08-30 MED ORDER — BUPRENORPHINE HCL-NALOXONE HCL 2-0.5 MG SL SUBL: 2.0000 | SUBLINGUAL_TABLET | Freq: Two times a day (BID) | SUBLINGUAL | Status: DC

## 2019-08-30 MED ORDER — BUPRENORPHINE HCL-NALOXONE HCL 2-0.5 MG SL SUBL
2.0000 | SUBLINGUAL_TABLET | Freq: Two times a day (BID) | SUBLINGUAL | Status: DC
  Administered 2019-08-30 – 2019-09-02 (×7): 2 via SUBLINGUAL
  Filled 2019-08-30 (×7): qty 2

## 2019-08-30 MED ORDER — HYDROXYZINE HCL 25 MG PO TABS
50.0000 mg | ORAL_TABLET | Freq: Three times a day (TID) | ORAL | Status: DC | PRN
  Administered 2019-08-30: 15:00:00 50 mg via ORAL
  Filled 2019-08-30: qty 1

## 2019-08-30 MED ORDER — GABAPENTIN 600 MG PO TABS
600.0000 mg | ORAL_TABLET | Freq: Three times a day (TID) | ORAL | Status: DC
  Filled 2019-08-30 (×2): qty 1

## 2019-08-30 MED ORDER — CLONIDINE HCL 0.1 MG PO TABS
0.1000 mg | ORAL_TABLET | Freq: Three times a day (TID) | ORAL | Status: DC
  Administered 2019-08-30 – 2019-09-01 (×5): 0.1 mg via ORAL
  Filled 2019-08-30 (×11): qty 1

## 2019-08-30 MED ORDER — LACTATED RINGERS IV SOLN: 500.0000 mL | Freq: Once | INTRAVENOUS | Status: DC

## 2019-08-30 MED ORDER — LIDOCAINE HCL (PF) 1 % IJ SOLN: 30.0000 mL | INTRAMUSCULAR | Status: DC | PRN

## 2019-08-30 MED ORDER — LIDOCAINE HCL (PF) 1 % IJ SOLN
INTRAMUSCULAR | Status: DC | PRN
  Administered 2019-08-30: 11 mL via EPIDURAL

## 2019-08-30 MED ORDER — OXYTOCIN 40 UNITS IN NORMAL SALINE INFUSION - SIMPLE MED
2.5000 [IU]/h | INTRAVENOUS | Status: DC
  Administered 2019-08-31: 2.5 [IU]/h via INTRAVENOUS

## 2019-08-30 NOTE — Anesthesia Preprocedure Evaluation (Signed)
Anesthesia Evaluation  Patient identified by MRN, date of birth, ID band Patient awake    Reviewed: Allergy & Precautions, NPO status , Patient's Chart, lab work & pertinent test results  Airway Mallampati: II  TM Distance: >3 FB Neck ROM: Full    Dental no notable dental hx.    Pulmonary asthma , Current Smoker and Patient abstained from smoking.,    Pulmonary exam normal breath sounds clear to auscultation       Cardiovascular negative cardio ROS Normal cardiovascular exam Rhythm:Regular Rate:Normal     Neuro/Psych  Headaches, negative psych ROS   GI/Hepatic negative GI ROS, Neg liver ROS,   Endo/Other  negative endocrine ROS  Renal/GU negative Renal ROS  negative genitourinary   Musculoskeletal negative musculoskeletal ROS (+)   Abdominal   Peds negative pediatric ROS (+)  Hematology negative hematology ROS (+)   Anesthesia Other Findings   Reproductive/Obstetrics (+) Pregnancy                             Anesthesia Physical Anesthesia Plan  ASA: II  Anesthesia Plan: Epidural   Post-op Pain Management:    Induction:   PONV Risk Score and Plan:   Airway Management Planned:   Additional Equipment:   Intra-op Plan:   Post-operative Plan:   Informed Consent:   Plan Discussed with:   Anesthesia Plan Comments:         Anesthesia Quick Evaluation

## 2019-08-30 NOTE — Progress Notes (Signed)
Labor Progress Note Brittany Cortez is a 33 y.o. G3P2002 at [redacted]w[redacted]d presented for IOL for NR NST. S: Feeling ctx and fairly uncomfortable.  O:  BP 117/69   Pulse 70   Temp 97.6 F (36.4 C) (Oral)   Resp 16   Ht 5\' 7"  (1.702 m)   Wt 96.2 kg   LMP 11/18/2018 (Approximate)   BMI 33.20 kg/m  EFM: 135, reactive, moderate variability, pos accels, no decels Ctx: Irregular; q59m  CVE: Dilation: 4 Effacement (%): 50 Cervical Position: Posterior Station: -3 Presentation: Vertex Exam by:: [name]    A&P: 33 y.o. [id number] [redacted]w[redacted]d here for IOL for NRNST. TOLAC. #Labor: Progressing well. Foley bulb out with this exam. Will now augment Pit further. Max of 20 without IUPC. Anticipate SVD. #Pain: per patient request #FWB: Cat I #GBS negative  Chauncey Mann, MD 5:46 PM

## 2019-08-30 NOTE — Anesthesia Procedure Notes (Signed)
Epidural Patient location during procedure: OB Start time: 08/30/2019 7:44 PM End time: 08/30/2019 7:57 PM  Staffing Anesthesiologist: Lynda Rainwater, MD Performed: anesthesiologist   Preanesthetic Checklist Completed: patient identified, site marked, surgical consent, pre-op evaluation, timeout performed, IV checked, risks and benefits discussed and monitors and equipment checked  Epidural Patient position: sitting Prep: ChloraPrep Patient monitoring: heart rate, cardiac monitor, continuous pulse ox and blood pressure Approach: midline Location: L2-L3 Injection technique: LOR saline  Needle:  Needle type: Tuohy  Needle gauge: 17 G Needle length: 9 cm Needle insertion depth: 6 cm Catheter type: closed end flexible Catheter size: 20 Guage Catheter at skin depth: 10 cm Test dose: negative  Assessment Events: blood not aspirated, injection not painful, no injection resistance, negative IV test and no paresthesia  Additional Notes Reason for block:procedure for pain

## 2019-08-30 NOTE — Progress Notes (Signed)
   PRENATAL VISIT NOTE  Subjective:  Brittany Cortez is a 33 y.o. G3P2002 at [redacted]w[redacted]d being seen today for ongoing prenatal care.  She is currently monitored for the following issues for this high-risk pregnancy and has Asthma; Hx of narcotic abuse; Severe alcohol use disorder ([place]); Opioid dependence ([place]); ADHD (attention deficit hyperactivity disorder); Substance induced mood disorder ([place]); Smoker; Supervision of other normal pregnancy, antepartum; Pregnancy complicated by subutex maintenance, antepartum ([place]); History of cesarean delivery, currently pregnant; Rh negative status during pregnancy; and Unwanted fertility on their problem list.  Patient reports occasional contractions.  Contractions: Irregular. Vag. Bleeding: None.  Movement: Present. Denies leaking of fluid.   The following portions of the patient's history were reviewed and updated as appropriate: allergies, current medications, past family history, past medical history, past social history, past surgical history and problem list.   Objective:   Vitals:   08/30/19 0846  BP: 108/69  Pulse: 89  Weight: 215 lb 11.2 oz (97.8 kg)    Fetal Status: Fetal Heart Rate (bpm): NST   Movement: Present     General:  Alert, oriented and cooperative. Patient is in no acute distress.  Skin: Skin is warm and dry. No rash noted.   Cardiovascular: Normal heart rate noted  Respiratory: Normal respiratory effort, no problems with respiration noted  Abdomen: Soft, gravid, appropriate for gestational age.  Pain/Pressure: Present     Pelvic: Cervical exam deferred        Extremities: Normal range of motion.  Edema: Trace  Mental Status: Normal mood and affect. Normal behavior. Normal judgment and thought content.   Assessment and Plan:  Pregnancy: G3P2002 at [redacted]w[redacted]d 1. Supervision of other normal pregnancy, antepartum Good FM NST non reactive. 2 decelerations  Sending her to L&D for IOL. L&D notified and Dr. Ernestina Patches notified- L&D attending  called.  Reviewed concerns of FM 2. Substance induced mood disorder ([place])  3. Severe alcohol use disorder ([place])  4. Rh negative, antepartum S/p rhogam  5. Pregnancy complicated by subutex maintenance, antepartum ([place]) Keep current dose of meds  6. History of cesarean delivery, currently pregnant H/o prev VBAC at home (not intentional) desires TOLAC. Papers signed   Term labor symptoms and general obstetric precautions including but not limited to vaginal bleeding, contractions, leaking of fluid and fetal movement were reviewed in detail with the patient. Please refer to After Visit Summary for other counseling recommendations.   No follow-ups on file.  No future appointments.  Lavonia Drafts, MD

## 2019-08-30 NOTE — Progress Notes (Signed)
Pt is here for ROB and NST. [redacted]w[redacted]d.

## 2019-08-30 NOTE — H&P (Signed)
OBSTETRIC ADMISSION HISTORY AND PHYSICAL  Brittany Cortez is a 33 y.o. female G3P2002 with IUP at 3w0dby 137 wkUKoreapresenting for IOL secondary to non-reactive NST in clinic. She reports +FMs, No LOF, no VB, no blurry vision, headaches or peripheral edema, and RUQ pain.  She plans on breast and bottle feeding. She is undecided for birth control. [facility]   Dating: By 129 [place]--->  Estimated Date of Delivery: 08/30/19  Sono:  08/04/2019  @[redacted]w[redacted]d , CWD, normal anatomy, cephalic presentation, anterior placental lie, 2570g, 20% EFW  Prenatal History/Complications: Hx of C-section with successful VBAC Opioid dependence on Subutex Rh Negative Hx of alcohol abuse  Past Medical History: Past Medical History:  Diagnosis Date  . ADHD (attention deficit hyperactivity disorder)   . Alcohol abuse   . Anxiety   . Asthma   . Depression   . Headache(784.0)   . Narcotic abuse ([place]    history of   . Pregnant 10/31/2015  . Smoker 10/31/2015  . UTI (lower urinary tract infection) 12/06/2015    Past Surgical History: Past Surgical History:  Procedure Laterality Date  . CESAREAN SECTION  12/16/2011   Procedure: CESAREAN SECTION;  Surgeon: LFlorian Buff MD;  [facility];  Service: Gynecology;  Laterality: N/A;  . NO PAST SURGERIES    . OVARIAN CYST DRAINAGE  2013    Obstetrical History: OB History    Gravida  3   Para  2   Term  2   Preterm  0   AB  0   Living  2     SAB  0   TAB  0   Ectopic  0   Multiple  0   Live Births  2           Social History: Social History   Socioeconomic History  . Marital status: Married    Spouse name: Not on file  . Number of children: Not on file  . Years of education: Not on file  . Highest education level: Not on file  Occupational History  . Not on file  Social Needs  . Financial resource strain: Not on file  . Food insecurity    Worry: Not on file    Inability: Not on file  .  Transportation needs    Medical: Not on file    Non-medical: Not on file  Tobacco Use  . Smoking status: Current Every Day Smoker    Packs/day: 0.50    Years: 10.00    Pack years: 5.00    Types: Cigarettes  . Smokeless tobacco: Never Used  Substance and Sexual Activity  . Alcohol use: No    Comment: Hx alcohol abuse  . Drug use: Yes    Types: Marijuana, Amphetamines, Benzodiazepines    Comment: During pregnancy but states not recent (04/08/16) / newborn UDS+ (benzo, opiate, amp.- 05/25/16)  . Sexual activity: Yes    Birth control/protection: None  Lifestyle  . Physical activity    Days per week: Not on file    Minutes per session: Not on file  . Stress: Not on file  Relationships  . Social [place] phone: Not on file    Gets together: Not on file    Attends religious service: Not on file    Active member of club or organization: Not on file    Attends meetings of clubs or organizations: Not on file  Relationship status: Not on file  Other Topics Concern  . Not on file  Social History Narrative  . Not on file    Family History: Family History  Problem Relation Age of Onset  . Heart attack Mother   . Diabetes Father   . Cancer Maternal Grandfather        lung    Allergies: Allergies  Allergen Reactions  . Darvocet [Propoxyphene N-Acetaminophen] Nausea And Vomiting    Medications Prior to Admission  Medication Sig Dispense Refill Last Dose  . albuterol (PROAIR HFA) 108 (90 Base) MCG/ACT inhaler Inhale 2 puffs into the lungs 2 (two) times daily as needed for wheezing or shortness of breath. 18 g 1   . Blood Pressure Monitoring KIT 1 kit by Does not apply route once a week. 1 kit 0   . buprenorphine (SUBUTEX) 8 MG SUBL SL tablet Place under the tongue daily.     Marland Kitchen buPROPion (WELLBUTRIN XL) 150 MG 24 hr tablet Take 1 tablet (150 mg total) by mouth daily. 30 tablet 3   . cloNIDine (CATAPRES) 0.1 MG tablet Take 1 tablet (0.1 mg total) by mouth 3 (three)  times daily. 90 tablet 3   . Fluticasone-Salmeterol (ADVAIR) 100-50 MCG/DOSE AEPB Inhale 1 puff into the lungs every morning. 60 each 4   . gabapentin (NEURONTIN) 600 MG tablet Take 1 tablet (600 mg total) by mouth 3 (three) times daily. 90 tablet 2   . prenatal vitamin w/FE, FA (PRENATAL 1 + 1) 27-1 MG TABS tablet Take 1 tablet by mouth daily at 12 noon.     . QUEtiapine (SEROQUEL) 400 MG tablet Take 1 tablet (400 mg total) by mouth at bedtime for 120 days, THEN 1 tablet (400 mg total) at bedtime. 240 tablet 0      Review of Systems   All systems reviewed and negative except as stated in HPI  Last menstrual period 11/18/2018, unknown if currently breastfeeding. General appearance: alert, cooperative, appears stated age and no distress Lungs: normal effort Heart: regular rate  Abdomen: soft, non-tender; bowel sounds normal Pelvic: gravid uterus GU: No vaginal lesions  Extremities: Homans sign is negative, no sign of DVT Presentation: cephalic by exam Fetal monitoringBaseline: 125 bpm, Variability: Good {> 6 bpm), Accelerations: Reactive and Decelerations: Absent Uterine activityNone Dilation: 1.5 Effacement (%): Thick Station: -3 Exam by:: [name]    Prenatal labs: ABO, Rh: O/Negative/-- (05/01 1212) Antibody: Negative (05/01 1212) Rubella: <0.90 (05/01 1212) RPR: Non Reactive (09/18 0938)  HBsAg: Negative (05/01 1212)  HIV: Non Reactive (09/18 2778)  GBS: Negative/-- (10/29 0904)  2 hr Glucola WNL Genetic screening  Low risk NIPS Anatomy US WNL  Prenatal Transfer Tool  Maternal Diabetes: No Genetic Screening: Normal Maternal Ultrasounds/Referrals: Normal Fetal Ultrasounds or other Referrals:  None Maternal Substance Abuse:  Yes:  Type: Other: Subutex Significant Maternal Medications:  Seroquel, Wellbutrin, Clonidine, Gabapentin Significant Maternal Lab Results: Group B Strep negative and Rh negative  No results found for this or any previous visit (from the past 24  hour(s)).  Patient Active Problem List   Diagnosis Date Noted  . Fetal heart rate non-reassuring affecting management of mother 08/30/2019  . [redacted] weeks gestation of pregnancy 08/30/2019  . Rh negative status during pregnancy 08/10/2019  . Unwanted fertility 08/10/2019  . Pregnancy complicated by subutex maintenance, antepartum ([place]) 03/30/2019  . History of cesarean delivery, currently pregnant 03/30/2019  . Supervision of other normal pregnancy, antepartum 02/03/2019  . Smoker 10/31/2015  . Severe  alcohol use disorder ([place]) 01/26/2014  . Opioid dependence ([place]) 01/26/2014  . ADHD (attention deficit hyperactivity disorder) 01/26/2014  . Substance induced mood disorder ([place]) 01/26/2014  . Asthma 01/18/2013  . Hx of narcotic abuse 01/18/2013    Assessment/Plan:  Brittany Cortez is a 33 y.o. G3P2002 at 24w0dhere for IOL secondary to non-reactive NST in clinic. TOLAC.  #Labor: Foley bulb placed 1250. Will plan to start low dose Pit (max 6 while FB in place) once patient is done eating. Anticipate SVD. #Pain: Per patient request #FWB: Cat I; EFW: 3400g #ID:  GBS neg #MOF: Both #MOC: undecided; BTL in 6 weeks vs IUD (patient signed BTL consent but is now not sure and will think about IUD) #Asthma: Cont PTA inhalers; avoid Hemabate #Substance Abuse: Cont Subutex #Mood disorder: Cont PTA Wellbutrin (only uses Seroquel PRN)  CBarrington Ellison MD OB Family Medicine Fellow, [facility] [place]11/25/2020, 1:01 PM

## 2019-08-31 ENCOUNTER — Encounter (HOSPITAL_COMMUNITY): Payer: Self-pay

## 2019-08-31 DIAGNOSIS — Z30430 Encounter for insertion of intrauterine contraceptive device: Secondary | ICD-10-CM

## 2019-08-31 DIAGNOSIS — Z975 Presence of (intrauterine) contraceptive device: Secondary | ICD-10-CM

## 2019-08-31 DIAGNOSIS — Z3A40 40 weeks gestation of pregnancy: Secondary | ICD-10-CM

## 2019-08-31 DIAGNOSIS — O480 Post-term pregnancy: Secondary | ICD-10-CM

## 2019-08-31 LAB — RPR: RPR Ser Ql: NONREACTIVE

## 2019-08-31 MED ORDER — MAGNESIUM HYDROXIDE 400 MG/5ML PO SUSP: 30.0000 mL | ORAL | Status: DC | PRN

## 2019-08-31 MED ORDER — LEVONORGESTREL 19.5 MCG/DAY IU IUD
INTRAUTERINE_SYSTEM | INTRAUTERINE | Status: AC
  Administered 2019-08-31: 1 via INTRAUTERINE
  Filled 2019-08-31: qty 1

## 2019-08-31 MED ORDER — MEASLES, MUMPS & RUBELLA VAC IJ SOLR: 0.5000 mL | Freq: Once | INTRAMUSCULAR | Status: DC

## 2019-08-31 MED ORDER — RHO D IMMUNE GLOBULIN 1500 UNIT/2ML IJ SOSY
300.0000 ug | PREFILLED_SYRINGE | Freq: Once | INTRAMUSCULAR | Status: DC
  Filled 2019-08-31: qty 2

## 2019-08-31 MED ORDER — IBUPROFEN 600 MG PO TABS
600.0000 mg | ORAL_TABLET | Freq: Four times a day (QID) | ORAL | Status: DC
  Administered 2019-08-31 – 2019-09-02 (×10): 600 mg via ORAL
  Filled 2019-08-31 (×10): qty 1

## 2019-08-31 MED ORDER — SIMETHICONE 80 MG PO CHEW: 80.0000 mg | CHEWABLE_TABLET | ORAL | Status: DC | PRN

## 2019-08-31 MED ORDER — RHO D IMMUNE GLOBULIN 1500 UNIT/2ML IJ SOSY
300.0000 ug | PREFILLED_SYRINGE | Freq: Once | INTRAMUSCULAR | Status: AC
  Administered 2019-08-31: 300 ug via INTRAVENOUS
  Filled 2019-08-31: qty 2

## 2019-08-31 MED ORDER — ONDANSETRON HCL 4 MG/2ML IJ SOLN: 4.0000 mg | INTRAMUSCULAR | Status: DC | PRN

## 2019-08-31 MED ORDER — SENNOSIDES-DOCUSATE SODIUM 8.6-50 MG PO TABS
2.0000 | ORAL_TABLET | ORAL | Status: DC
  Administered 2019-09-01: 23:00:00 2 via ORAL
  Filled 2019-08-31 (×2): qty 2

## 2019-08-31 MED ORDER — PRENATAL MULTIVITAMIN CH
1.0000 | ORAL_TABLET | Freq: Every day | ORAL | Status: DC
  Administered 2019-08-31 – 2019-09-02 (×3): 1 via ORAL
  Filled 2019-08-31 (×3): qty 1

## 2019-08-31 MED ORDER — LACTATED RINGERS AMNIOINFUSION
INTRAVENOUS | Status: DC
  Administered 2019-08-31: 01:00:00 via INTRAUTERINE

## 2019-08-31 MED ORDER — BENZOCAINE-MENTHOL 20-0.5 % EX AERO
1.0000 "application " | INHALATION_SPRAY | CUTANEOUS | Status: DC | PRN
  Administered 2019-08-31: 1 via TOPICAL
  Filled 2019-08-31: qty 56

## 2019-08-31 MED ORDER — ONDANSETRON HCL 4 MG PO TABS: 4.0000 mg | ORAL_TABLET | ORAL | Status: DC | PRN

## 2019-08-31 MED ORDER — COCONUT OIL OIL: 1.0000 "application " | TOPICAL_OIL | Status: DC | PRN

## 2019-08-31 MED ORDER — ACETAMINOPHEN 325 MG PO TABS: 650.0000 mg | ORAL_TABLET | ORAL | Status: DC | PRN

## 2019-08-31 MED ORDER — DIPHENHYDRAMINE HCL 25 MG PO CAPS: 25.0000 mg | ORAL_CAPSULE | Freq: Four times a day (QID) | ORAL | Status: DC | PRN

## 2019-08-31 MED ORDER — DIBUCAINE (PERIANAL) 1 % EX OINT: 1.0000 "application " | TOPICAL_OINTMENT | CUTANEOUS | Status: DC | PRN

## 2019-08-31 MED ORDER — WITCH HAZEL-GLYCERIN EX PADS: 1.0000 "application " | MEDICATED_PAD | CUTANEOUS | Status: DC | PRN

## 2019-08-31 MED ORDER — LEVONORGESTREL 19.5 MCG/DAY IU IUD
INTRAUTERINE_SYSTEM | Freq: Once | INTRAUTERINE | Status: AC
  Administered 2019-08-31: 06:00:00 1 via INTRAUTERINE

## 2019-08-31 NOTE — Progress Notes (Signed)
LABOR PROGRESS NOTE  Brittany Cortez is a 33 y.o. G3P2002 at [redacted]w[redacted]d  admitted for IOL for NR NST  Subjective: Patient is feeling okay.   Objective: BP 116/74   Pulse 84   Temp 98.1 F (36.7 C) (Oral)   Resp 17   Ht 5\' 7"  (1.702 m)   Wt 96.2 kg   LMP 11/18/2018 (Approximate)   SpO2 100%   BMI 33.20 kg/m  or  Vitals:   08/30/19 2231 08/30/19 2301 08/31/19 0001 08/31/19 0031  BP: 109/69 97/60 104/60 116/74  Pulse: 74 82 65 84  Resp:      Temp:      TempSrc:      SpO2:      Weight:      Height:       ~0015 Dilation: 4.5 Effacement (%): 50, 60 Cervical Position: Posterior Station: 0 Presentation: Vertex Exam by:: Dr. Ouida Sills FHT: baseline rate 135, moderate varibility, none acel, variable decels Toco: moderate  Labs: Lab Results  Component Value Date   WBC 11.7 (H) 08/30/2019   HGB 12.2 08/30/2019   HCT 38.1 08/30/2019   MCV 90.7 08/30/2019   PLT 309 08/30/2019    Patient Active Problem List   Diagnosis Date Noted  . Fetal heart rate non-reassuring affecting management of mother 08/30/2019  . [redacted] weeks gestation of pregnancy 08/30/2019  . Rh negative status during pregnancy 08/10/2019  . Unwanted fertility 08/10/2019  . Pregnancy complicated by subutex maintenance, antepartum ([place]) 03/30/2019  . History of cesarean delivery, currently pregnant 03/30/2019  . Supervision of other normal pregnancy, antepartum 02/03/2019  . Smoker 10/31/2015  . Severe alcohol use disorder ([place]) 01/26/2014  . Opioid dependence ([place]) 01/26/2014  . ADHD (attention deficit hyperactivity disorder) 01/26/2014  . Substance induced mood disorder ([place]) 01/26/2014  . Asthma 01/18/2013  . Hx of narcotic abuse 01/18/2013    Assessment / Plan: 33 y.o. G3P2002 at [redacted]w[redacted]d here for  IOL for NR NST. During cervical check, membranes ruptured spontaneously with mild clear fluid. Vertex position verified and no cord was palpated. Patient appears altered s/p home pain/anxiety meds.  Labor:  progressing on pitocin with now SROM and IUPC places for amnio infusion Fetal Wellbeing:  Cat II Pain Control:  Epidural, also received chronic meds- gabapentin and clonidine. Anticipated MOD:  Vaginal   Doristine Mango, DO PGY-2 FM 08/31/2019, 12:51 AM

## 2019-08-31 NOTE — Lactation Note (Signed)
This note was copied from a baby's chart. Lactation Consultation Note  Patient Name: Brittany Cortez BMWUX'L Date: 08/31/2019 Reason for consult: Initial assessment;Term;1st time breastfeeding  P3 mother whose infant is now 72 hours old.  Mother did not breast feed her other two children but desires to breast feed this baby.  Baby was asleep in the bassinet when I arrived.  Discussed basic breast feeding concepts with mother.  Encouraged a lot of STS.  Taught hand expression and mother excited to be able to express one ml of colostrum.  Container provided and milk storage times reviewed.  Finger feeding demonstrated.  Encouraged to feed 8-12 times/24 hours or sooner if baby shows cues.  Educated mother on feeding cues and how to obtain a deep latch.  Mother's breasts are soft and non tender and nipples are everted and intact.    Mother does not have a DEBP for home use.  Options discussed, however, the [place] may not be a legitimate option because mother informed me that she will probably take the formula from [facility].  She did mention that she would buy a DEBP if she decides it would be necessary.  Explained the importance of obtaining a good quality breast pump for it to be effective.  Mother verbalized understanding.    Mom made aware of O/P services, breastfeeding support groups, community resources, and our phone # for post-discharge questions.  Mother will call for assistance as needed.   Maternal Data Formula Feeding for Exclusion: No Has patient been taught Hand Expression?: Yes Does the patient have breastfeeding experience prior to this delivery?: No  Feeding Feeding Type: Breast Fed  LATCH Score                   Interventions    Lactation Tools Discussed/Used WIC Program: Yes   Consult Status Consult Status: Follow-up Date: 09/01/19 Follow-up type: In-patient    Brittany Cortez [name] 08/31/2019, 2:15 PM

## 2019-08-31 NOTE — Discharge Summary (Signed)
Postpartum Discharge Summary     Patient Name: Brittany Cortez DOB: 1986-01-23 MRN: [id number]  Date of admission: 08/30/2019 Delivering Provider: Richarda Osmond   Date of discharge: 09/02/2019  Admitting diagnosis: induction Intrauterine pregnancy: [redacted]w[redacted]d    Secondary diagnosis:  Active Problems:   Asthma   Hx of narcotic abuse   Opioid dependence ([place]   Substance induced mood disorder ([place]   Supervision of other normal pregnancy, antepartum   History of cesarean delivery, currently pregnant   Rh negative status during pregnancy   Unwanted fertility   Fetal heart rate non-reassuring affecting management of mother   [redacted] weeks gestation of pregnancy   IUD (intrauterine device) in place  Additional problems: None     Discharge diagnosis: Term Pregnancy Delivered and VBAC                                                                                                Post partum procedures:post placental Liletta IUD placement  Augmentation: Pitocin and Foley Balloon  Complications: None  Hospital course:  Induction of Labor With Vaginal Delivery   33y.o. yo G3P2002 at 489w1das admitted to the hospital 08/30/2019 for induction of labor.  Indication for induction: Postdates and non-reactive NST.  Patient had an uncomplicated labor course as follows: induced with foley balloon and pitocin, then had SROM and progressed to fully dilated and had uncomplicated VBAC. Membrane Rupture Time/Date: 12:26 AM ,08/31/2019   Intrapartum Procedures: Episiotomy: None [1]                                         Lacerations:  1st degree [2]  Patient had delivery of a Viable infant.  Information for the patient's newborn:  DaEbonie, W[place]0[[id number]]Delivery Method: Vag-Spont    08/31/2019  Details of delivery can be found in separate delivery note.   Patient also had post-placental Liletta IUD placed and was counseled on no sex until string check, tuck strings in if coming out until  visit.   Patient had a routine postpartum course.  Patient has already established relationship with suboxone prescriber which she planned to continue at the time of discharge.   Patient is discharged home 09/02/19. Delivery time: 5:09 AM    Magnesium Sulfate received: No BMZ received: No Rhophylac:Yes MMR: pending at time of discharge Transfusion:No  Physical exam  Vitals:   09/01/19 0500 09/01/19 1545 09/01/19 2233 09/02/19 0544  BP: 107/70 117/78 121/85 122/80  Pulse:  78 77 79  Resp: _0 Temp: 98.4 F (36.9 C) 98.1 F (36.7 C) 97.7 F (36.5 C) 97.9 F (36.6 C)  TempSrc: Oral Oral Oral Oral  SpO2:   100% 100%  Weight:      Height:       General: alert, cooperative and no distress Lochia: appropriate Uterine Fundus: firm Incision: N/A DVT Evaluation: No evidence of DVT seen on physical exam. No significant calf/ankle edema. Labs: Lab Results  Component Value Date  WBC 11.7 (H) 08/30/2019   HGB 12.2 08/30/2019   HCT 38.1 08/30/2019   MCV 90.7 08/30/2019   PLT 309 08/30/2019   CMP Latest Ref Rng & Units 05/08/2015  Glucose 65 - 99 mg/dL 92  BUN 6 - 20 mg/dL 12  Creatinine 0.44 - 1.00 mg/dL 0.94  Sodium 135 - 145 mmol/L 140  Potassium 3.5 - 5.1 mmol/L 3.5  Chloride 101 - 111 mmol/L 109  CO2 22 - 32 mmol/L 25  Calcium 8.9 - 10.3 mg/dL 8.4(L)  Total Protein 6.5 - 8.1 g/dL 5.5(L)  Total Bilirubin 0.3 - 1.2 mg/dL 1.3(H)  Alkaline Phos 38 - 126 U/L 64  AST 15 - 41 U/L 47(H)  ALT 14 - 54 U/L 51    Discharge instruction: per After Visit Summary and "Baby and Me Booklet".  After visit meds:  Allergies as of 09/02/2019      Reactions   Darvocet [propoxyphene N-acetaminophen] Nausea And Vomiting      Medication List    TAKE these medications   albuterol 108 (90 Base) MCG/ACT inhaler Commonly known as: ProAir HFA Inhale 2 puffs into the lungs 2 (two) times daily as needed for wheezing or shortness of breath.   Blood Pressure Monitoring Kit 1  kit by Does not apply route once a week.   buprenorphine 8 MG Subl SL tablet Commonly known as: SUBUTEX Place under the tongue daily.   buPROPion 150 MG 24 hr tablet Commonly known as: WELLBUTRIN XL Take 1 tablet (150 mg total) by mouth daily.   calcium carbonate 500 MG chewable tablet Commonly known as: TUMS - dosed in mg elemental calcium Chew 2 tablets by mouth as needed for indigestion or heartburn.   cloNIDine 0.1 MG tablet Commonly known as: Catapres Take 1 tablet (0.1 mg total) by mouth 3 (three) times daily.   Fluticasone-Salmeterol 100-50 MCG/DOSE Aepb Commonly known as: ADVAIR Inhale 1 puff into the lungs every morning.   gabapentin 600 MG tablet Commonly known as: NEURONTIN Take 1 tablet (600 mg total) by mouth 3 (three) times daily.   ibuprofen 600 MG tablet Commonly known as: ADVIL Take 1 tablet (600 mg total) by mouth every 6 (six) hours.   polyethylene glycol powder 17 GM/SCOOP powder Commonly known as: GLYCOLAX/MIRALAX Take 255 g by mouth once for 1 dose.   prenatal vitamin w/FE, FA 27-1 MG Tabs tablet Take 1 tablet by mouth daily at 12 noon.   QUEtiapine 400 MG tablet Commonly known as: SEROQUEL Take 1 tablet (400 mg total) by mouth at bedtime for 120 days, THEN 1 tablet (400 mg total) at bedtime. Start taking on: June 06, 2019       Diet: routine diet  Activity: Advance as tolerated. Pelvic rest for 6 weeks.   Outpatient follow up:4 weeks Follow up Appt: Future Appointments  Date Time Provider [place]  10/04/2019  9:00 AM Lavonia Drafts, MD [place] None   Follow up Visit:   Please schedule this patient for Postpartum visit in: 4 weeks with the following provider: Any provider For C/S patients schedule nurse incision check in weeks 2 weeks: no High risk pregnancy complicated by: OUD on subutex, Rh neg, TOLAC Delivery mode:  SVD Anticipated Birth Control:  post placental IUD placed at time of delivery PP Procedures  needed: IUD string check  Schedule Integrated BH visit: no    Newborn Data: Live born female  Birth Weight:  3062 grams APGAR: 9, 9  Newborn Delivery   Birth date/time: 08/31/2019  05:09:00 Delivery type: Vaginal, Spontaneous      Baby Feeding: Bottle and Breast Disposition:home with mother   09/02/2019 Clarnce Flock, MD

## 2019-08-31 NOTE — Procedures (Signed)
Post-Placental IUD Insertion Procedure Note  Patient identified, informed consent signed prior to delivery, signed copy in chart, time out was performed.    Vaginal, labial and perineal areas thoroughly inspected for lacerations. L periuretheral degree laceration identified - hemostatic, not repaired prior to insertion of IUD.  Liletta  - IUD grasped between sterile gloved fingers. Sterile lubrication applied to sterile gloved hand for ease of insertion. Fundus identified through abdominal wall using non-insertion hand. IUD inserted to fundus with bimanual technique. IUD carefully released at the fundus and insertion hand gently removed from vagina.    Strings trimmed to the level of the introitus. Patient tolerated procedure well.  Lot # [id number] Expiration Date03/10/2022  Patient given post procedure instructions and IUD care card with expiration date.  Patient is asked to keep IUD strings tucked in her vagina until her postpartum follow up visit in 4-6 weeks. Patient advised to abstain from sexual intercourse and pulling on strings before her follow-up visit. Patient verbalized an understanding of the plan of care and agrees.

## 2019-09-01 LAB — RH IG WORKUP (INCLUDES ABO/RH)
ABO/RH(D): O NEG
Fetal Screen: NEGATIVE
Gestational Age(Wks): 40
Unit division: 0

## 2019-09-01 NOTE — Anesthesia Postprocedure Evaluation (Signed)
Anesthesia Post Note  Patient: Brittany Cortez  Procedure(s) Performed: AN AD [place]     Patient location during evaluation: Mother Baby Anesthesia Type: Epidural Level of consciousness: awake and alert and oriented Pain management: satisfactory to patient Vital Signs Assessment: post-procedure vital signs reviewed and stable Respiratory status: spontaneous breathing and nonlabored ventilation Cardiovascular status: stable Postop Assessment: no headache, no backache, no signs of nausea or vomiting, adequate PO intake, patient able to bend at knees and able to ambulate (patient up walking) Anesthetic complications: no    Last Vitals:  Vitals:   08/31/19 2139 09/01/19 0500  BP: 124/71 107/70  Pulse:    Resp: 18 16  Temp: 36.8 C 36.9 C  SpO2: 100%     Last Pain:  Vitals:   09/01/19 0538  TempSrc:   PainSc: 0-No pain   Pain Goal:                   [name]

## 2019-09-01 NOTE — Progress Notes (Signed)
POSTPARTUM PROGRESS NOTE  Post Partum Day #1  Subjective:  Brittany Cortez is a 33 y.o. G3P3003 s/p VBAC at [redacted]w[redacted]d  She reports she is doing well. No acute events overnight. She denies any problems with ambulating, voiding or po intake. Denies nausea or vomiting.  Pain is well controlled.  Lochia is appropriate.  Objective: Blood pressure 107/70, pulse 79, temperature 98.4 F (36.9 C), temperature source Oral, resp. rate 16, height _0  (1.702 m), weight 96.2 kg, last menstrual period 11/18/2018, SpO2 100 %, unknown if currently breastfeeding.  Physical Exam:  General: alert, cooperative and no distress Chest: no respiratory distress Heart:regular rate, distal pulses intact Abdomen: soft, nontender,  Uterine Fundus: firm, appropriately tender DVT Evaluation: No calf swelling or tenderness Extremities: scant LE edema Skin: warm, dry  Recent Labs    08/30/19 1248  HGB 12.2  HCT 38.1    Assessment/Plan: Brittany PARKINSONis a 33y.o. 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   LOS: 2 days   MaAugustin CoupeMD/MPH OB Fellow  09/01/2019, 6:17 AM

## 2019-09-01 NOTE — Clinical Social Work Maternal (Addendum)
CLINICAL SOCIAL WORK MATERNAL/CHILD NOTE  Patient Details  Name: Brittany Cortez MRN: [id number] Date of Birth: 02-16-1986  Date:  09/01/2019  Clinical Social Worker Initiating Note:  Elijio Miles Date/Time: Initiated:  09/01/19/0910     Child's Name:  Brittany Cortez (MOB unsure of last name)   Biological Parents:  Mother, Father(Brittany Cortez and Brittany Cortez)   Need for Interpreter:  None   Reason for Referral:  (MOB currently taking Subutex; lives in a mother/baby group home; does not have custody of other children)   Address:  [address] [place] [address]    Phone number:  [phone] (home)     Additional phone number:   Household Members/Support Persons (HM/SP):   Household Member/Support Person 1, Household Member/Support Person 2   HM/SP Name Relationship DOB or Age  HM/SP -Thaxton Son (lives with [place]) 05/25/2016  HM/SP -2 Valene Bors Daughter (lives with Idolina Primer - [place]) 12/16/2011  HM/SP -3        HM/SP -4        HM/SP -5        HM/SP -6        HM/SP -7        HM/SP -8          Natural Supports (not living in the home):  Immediate Family, Extended Family, Radiographer, therapeutic Supports: Organized support group (Comment)(NA community)   Employment: Unemployed   Type of Work:     Education:  Programmer, systems   Homebound arranged:    Museum/gallery curator Resources:  Medicaid   Other Resources:  Physicist, medical , [place] Considerations Which May Impact Care:    Strengths:  Ability to meet basic needs , Home prepared for child , Psychotropic Medications, Pediatrician chosen   Psychotropic Medications:  Seroquel, Wellbutrin      Pediatrician:    [place]olic[place] area  Pediatrician List:   [facility]      Pediatrician Fax Number:    Risk Factors/Current Problems:       Cognitive State:  Able to Concentrate , Alert , Linear Thinking    Mood/Affect:  Calm , Comfortable , Interested , Relaxed    CSW Assessment:  CSW received consult for "Subutex, pt lives in mother/baby group home-assess resources". CSW met with MOB to offer support and complete assessment.    MOB resting in bed holding infant, when CSW entered the room. CSW introduced self and explained reason for consult to which MOB expressed understanding. MOB welcoming of CSW visit and was pleasant and engaged throughout assessment. MOB observed to be well-bonded to infant and tending to infant cues during visit. MOB shared she currently lives in a [place] [facility] [place] and has been there since May. MOB stated she is able to return and bring infant with her. MOB reported she has two other children and verified they are not currently in her custody. MOB shared her oldest child currently lives with MOB's father Rogelia Mire) and has since April of 2018. MOB stated MOB's father has full custody of child but denied any CPS involvement in placement. MOB stated her son currently lives with his paternal grandmother Soyla Murphy) who has emergency custody of infant. MOB reported infant goes between paternal grandmother and MOB's aunt (Cayce Raw). Per MOB, they are  getting ready to start a "custody thing" so that MOB's aunt can have custody of child. MOB reported she has little contact with her oldest child but talks to her son often. MOB did share CPS was involved in placement of her son at 24 months old but stated she did not work her case plan and was homeless so PGM gained custody about a year and a half ago. MOB openly shared last 3 years have been rough but stated she has been sober since December and actively participates in NA and has a sponsor. MOB reported she currently takes Subutex and has been on it since February of this year. MOB stated she gets her prescription every two weeks through [facility]  (Dr. Tinnie Gens) and feels it is effective in managing her symptoms.   CSW inquired about MOB's mental health history and MOB acknowledged being diagnosed with anxiety and depression "years ago". MOB stated she does well when she is taking her medication and denied any mental health symptoms during pregnancy. MOB currently taking Welbutrin and Seroquel that was being prescribed through [facility] before starting prenatal care. MOB shared with CSW that she has had a few admits to [facility] for mental health and substance use back in 2019 and prior to that. MOB denied any admissions since 2019. MOB did acknowledged experiencing some mild PPD following the birth of both of her other children but felt it wasn't anything that wasn't manageable. CSW provided education regarding the baby blues period vs. perinatal mood disorders, discussed treatment and gave resources for mental health follow up if concerns arise.  CSW recommends self-evaluation during the postpartum time period using the New Mom Checklist from Postpartum Progress and encouraged MOB to contact a medical professional if symptoms are noted at any time. MOB did not appear to be displaying any acute mental health symptoms and denied any current SI, HI or DV. MOB reported feeling well-supported by her family, the girls she lives with, her sponsor and her NA community.  CSW informed MOB of Hospital Drug Policy due to Subutex prescription during pregnancy and explained UDS and CDS were both still pending but would continue to be monitored and CPS would be updated of any positive results. CSW explained to MOB that due to MOB's Subutex during pregnancy and not having custody of her other children that a report would be made to [place]. MOB understanding of this and stated she already knew they would likely be involved. MOB confirmed having all essential items for infant once discharged and stated infant would be sleeping in a pack 'n' play once home.  CSW provided review of Sudden Infant Death Syndrome (SIDS) precautions and safe sleeping habits.    CSW made [facility] CPS report with Ross Marcus due to MOB's subutex use during pregnancy and not having custody of her other children. CSW to await CPS disposition. CSW will continue to offer MOB support and resources as long as she and/or infant are here.    CSW Plan/Description:  Psychosocial Support and Ongoing Assessment of Needs, Sudden Infant Death Syndrome (SIDS) Education, Perinatal Mood and Anxiety Disorder (PMADs) Education, Hospital Drug Screen Policy Information, Child Protective Service Report , CSW Awaiting CPS Disposition Plan, CSW Will Continue to Monitor Umbilical Cord Tissue Drug Screen Results and Make Report if Foye Spurling, [place] 07-23-2019, 10:13 AM

## 2019-09-01 NOTE — Lactation Note (Signed)
This note was copied from a baby's chart. Lactation Consultation Note  Patient Name: Girl Janiyla Long HTDSK'[id number] Date: 09/01/2019 Reason for consult: Follow-up assessment;1st time breastfeeding  1625 - 1657 - I followed up with Ms. Wynn to check on her progress with breast feeding. She reports that baby "Millie" was breast feeding yesterday, but in general, she has had some difficulty with maintaining her latch. Ms. Franken does express interest in breast feeding and providing her EBM. She verablizes understanding of supplying Millie with her EBM due to maternal medications, and she is aware of the health benefits of her colostrum.  She has been using a manual pump today and also supplementing formula by bottle. Baby took 20 mls of [employer] prior to entry. I assisted with placing baby in swaddle with bili blanket in her bassinet.  Ms. Pagan expressed interest in a DEBP. I set it up and reviewed disassembly, cleaning and reassembly. I then assisted with initiating pumping using size 24 flanges on the initiation setting. I recommended putting baby to breast first and pumping if baby receives a bottle or does not latch. I recommended timing pumping with baby's feedings. We discussed anticipated output in days 1-3 with breast pumping. I did note colostrum was present when pumping.  I reviewed milk storage guidelines for EBM and recommended feeding her EBM prior to offering formula. I provided additional colostrum containers and spoons.  Ms. Heidt verbalized understanding and will call lactation this evening as needed.    Maternal Data Has patient been taught Hand Expression?: Yes Does the patient have breastfeeding experience prior to this delivery?: No  Feeding Feeding Type: Formula Nipple Type: Slow - flow   Interventions Interventions: Breast feeding basics reviewed;DEBP;Expressed milk  Lactation Tools Discussed/Used Tools: Pump Breast pump type: Double-Electric Breast  Pump   Consult Status Consult Status: Follow-up Date: 09/02/19 Follow-up type: In-patient    Lenore Manner 09/01/2019, 5:29 PM

## 2019-09-02 MED ORDER — POLYETHYLENE GLYCOL 3350 17 GM/SCOOP PO POWD: 1.0000 | Freq: Once | ORAL | 0 refills | Status: AC

## 2019-09-02 MED ORDER — IBUPROFEN 600 MG PO TABS: 600.0000 mg | ORAL_TABLET | Freq: Four times a day (QID) | ORAL | 0 refills | Status: DC

## 2019-09-02 NOTE — Discharge Instructions (Signed)

## 2019-09-02 NOTE — Progress Notes (Signed)
Pt was sitting up in bed, holding Millie, when I arrived. Another visitor was present. She was not in need of Chaplain visit at the time and said she was good. Please page if support is needed later. Beverly, MDiv   09/02/19 1600  Clinical Encounter Type  Visited With Patient

## 2019-09-02 NOTE — Lactation Note (Addendum)
This note was copied from a baby's chart. Lactation Consultation Note: Mother reports that she plans to pump this morning and she will try and pump every 2-3 hours today. Mother reports that she has been alone taking care of infant and she is very tired.   Infant is under single photo tx. Discussed the benefits for infant to have as much breastmilk as possible.   Discussed hand expression, breast massage. LC offered to assist with pumping. Mother reports she will pump later when she gets up;Marland Kitchen Discussed risk of getting engorged. Discussed treatment and prevention of engorgement.  Mother reports that her breast are not tender or filling at this time.  Infant is lying in crib suckling vigorously on a pacifier. Suggested that infant is showing early cues to eat.   Advised mother to cue base feed infant and at least 8-12 times or more in 24 hours.  Mother is active with [facility] . She reports this am she is unsure if she plans to breastfeed or pump when she arrives home. Mother will page [facility] or staff member if she needs assistance with pumping.   Patient Name: Brittany Cortez [id number] Date: 09/02/2019 Reason for consult: Follow-up assessment   Maternal Data    Feeding    LATCH Score                   Interventions Interventions: DEBP  Lactation Tools Discussed/Used     Consult Status Consult Status: Follow-up Date: 09/02/19 Follow-up type: In-patient    Jess Barters [facility] 09/02/2019, 8:01 AM

## 2019-09-03 ENCOUNTER — Ambulatory Visit: Payer: Self-pay

## 2019-09-03 LAB — BPAM RBC
Blood Product Expiration Date: 202012182359
Blood Product Expiration Date: 202012192359
Unit Type and Rh: 9500
Unit Type and Rh: 9500

## 2019-09-03 LAB — TYPE AND SCREEN
ABO/RH(D): O NEG
Antibody Screen: POSITIVE
Unit division: 0
Unit division: 0

## 2019-09-03 NOTE — Lactation Note (Addendum)
This note was copied from a baby's chart. Lactation Consultation Note  Patient Name: Girl Raahi Korber GYJEH'U Date: 09/03/2019   Mom reports that she is "rock hard" and that the last time she pumped was yesterday afternoon. Leaking can be observed through her T-shirt. I offered to assist Mom with pumping, but she declined, saying that she was going to feed infant first & then pump. Mom says she hasn't had any issues with pumping & that the last time she pumped she got about 10 mL.   Per Mom's discharge list, these are her current meds worth noting: Buprenorphine 8 mg (L2) Bupropion 150 mg XL (L3) Clonidine 0.1mg  TID (L3) Gabapentin 600 mg tid (L2) Quetiapine 400 mg @ hs (L2)  Infant noted to have only lost 46 g over the last 24 hours.  Matthias Hughs [facility] 09/03/2019, 9:39 AM

## 2019-09-22 ENCOUNTER — Telehealth: Payer: Self-pay | Admitting: *Deleted

## 2019-09-22 NOTE — Telephone Encounter (Signed)
Call received from [place] for approval on Seroquel refill. Pharmacy states that pt has not filled since September and will not fill with out provider approval.   Can you please review chart and send refill if approved or advise.

## 2019-09-26 ENCOUNTER — Encounter: Payer: Self-pay | Admitting: Obstetrics and Gynecology

## 2019-09-26 ENCOUNTER — Telehealth (INDEPENDENT_AMBULATORY_CARE_PROVIDER_SITE_OTHER): Payer: Medicaid Other | Admitting: Obstetrics and Gynecology

## 2019-09-26 DIAGNOSIS — F320 Major depressive disorder, single episode, mild: Secondary | ICD-10-CM

## 2019-09-26 DIAGNOSIS — Z1389 Encounter for screening for other disorder: Secondary | ICD-10-CM | POA: Diagnosis not present

## 2019-09-26 DIAGNOSIS — F1129 Opioid dependence with unspecified opioid-induced disorder: Secondary | ICD-10-CM

## 2019-09-26 DIAGNOSIS — Z975 Presence of (intrauterine) contraceptive device: Secondary | ICD-10-CM

## 2019-09-26 NOTE — Progress Notes (Signed)
Pt states that she is having PP depression. Pt states that current medication don't seem to be helping her.  Pt currently taking Wellbutrin.  Pt is currently on Subutex and in recovery. Pt states she has been dx with Depression in the past. Pt states she feels worthless.  Pt score 21 on EPDS.   Pt states she has good support system at home, family and sponsor. Pt is currently living in [place] with infant.

## 2019-09-26 NOTE — Progress Notes (Signed)
TELEHEALTH POSTPARTUM VIRTUAL VIDEO VISIT ENCOUNTER NOTE   Provider location: Center for [facility] at [place]   I connected with Brittany Cortez on 09/26/19 at  2:30 PM EST by WebEx Video Encounter at home and verified that I am speaking with the correct person using two identifiers.    I discussed the limitations, risks, security and privacy concerns of performing an evaluation and management service virtually and the availability of in person appointments. I also discussed with the patient that there may be a patient responsible charge related to this service. The patient expressed understanding and agreed to proceed.  Chief Complaint: Postpartum Visit  History of Present Illness: Brittany Cortez is a 33 y.o. Caucasian G3P3003 being evaluated for postpartum followup.    She is s/p VBAC and IUD placement at 08/31/19 weeks; she was discharged to home on PPD#2. Pregnancy complicated by opioid abuse, h/o c-section, Rh negative status. Baby is doing well. She is currently living in a recovery home currently for sober living.   Complains of feeling like she is slipping into post partum depression. Has history of severe depression. Feels like her anti-depssant is not working. Feels like over the last week, depression is getting worse. Feels like she doesn't want to do anything, feels down all the time. Not eating much. Doesn't want to shower. Feels a lot of anxiety, feels worthless and hopeless. Worse over the last week.  Denies suicidal, homicidal  Vaginal bleeding or discharge: Yes  Intercourse: No  Contraception: IUD in place Mode of feeding infant: Bottle, baby is fussy/gassy and spitting up, has had formula changed PP depression s/s: Yes .  Any bowel or bladder issues: No  Pap smear: no abnormalities (date: 02/2019)  Review of Systems: Positive for n/a. Her 12 point review of systems is negative or as noted in the History of Present Illness.  Patient Active Problem List   Diagnosis Date Noted  . IUD (intrauterine device) in place 08/31/2019  . Fetal heart rate non-reassuring affecting management of mother 08/30/2019  . [redacted] weeks gestation of pregnancy 08/30/2019  . Rh negative status during pregnancy 08/10/2019  . Unwanted fertility 08/10/2019  . Pregnancy complicated by subutex maintenance, antepartum ([place]) 03/30/2019  . History of cesarean delivery, currently pregnant 03/30/2019  . Supervision of other normal pregnancy, antepartum 02/03/2019  . Smoker 10/31/2015  . Severe alcohol use disorder ([place]) 01/26/2014  . Opioid dependence ([place]) 01/26/2014  . ADHD (attention deficit hyperactivity disorder) 01/26/2014  . Substance induced mood disorder ([place]) 01/26/2014  . Asthma 01/18/2013  . Hx of narcotic abuse 01/18/2013    Medications Brittany Cortez had no medications administered during this visit. Current Outpatient Medications  Medication Sig Dispense Refill  . albuterol (PROAIR HFA) 108 (90 Base) MCG/ACT inhaler Inhale 2 puffs into the lungs 2 (two) times daily as needed for wheezing or shortness of breath. 18 g 1  . buprenorphine (SUBUTEX) 8 MG SUBL SL tablet Place under the tongue daily.    Marland Kitchen buPROPion (WELLBUTRIN XL) 150 MG 24 hr tablet Take 1 tablet (150 mg total) by mouth daily. 30 tablet 3  . cloNIDine (CATAPRES) 0.1 MG tablet Take 1 tablet (0.1 mg total) by mouth 3 (three) times daily. 90 tablet 3  . gabapentin (NEURONTIN) 600 MG tablet Take 1 tablet (600 mg total) by mouth 3 (three) times daily. 90 tablet 2  . Blood Pressure Monitoring KIT 1 kit by Does not apply route once a week. 1 kit 0  .  calcium carbonate (TUMS - DOSED IN MG ELEMENTAL CALCIUM) 500 MG chewable tablet Chew 2 tablets by mouth as needed for indigestion or heartburn.    . Fluticasone-Salmeterol (ADVAIR) 100-50 MCG/DOSE AEPB Inhale 1 puff into the lungs every morning. 60 each 4  . ibuprofen (ADVIL) 600 MG tablet Take 1 tablet (600 mg total) by mouth every 6 (six) hours. 30 tablet 0    . prenatal vitamin w/FE, FA (PRENATAL 1 + 1) 27-1 MG TABS tablet Take 1 tablet by mouth daily at 12 noon.    . QUEtiapine (SEROQUEL) 400 MG tablet Take 1 tablet (400 mg total) by mouth at bedtime for 120 days, THEN 1 tablet (400 mg total) at bedtime. 240 tablet 0   No current facility-administered medications for this visit.    Allergies Darvocet [propoxyphene n-acetaminophen]  Physical Exam:  There were no vitals taken for this visit. General:  Alert, oriented and cooperative. Patient is in no acute distress.  Mental Status: Normal mood and affect. Normal behavior. Normal judgment and thought content.   Respiratory: Normal respiratory effort noted, no problems with respiration noted  Rest of physical exam deferred due to type of encounter  PP Depression Screening:   Edinburgh Postnatal Depression Scale Screening Tool 09/26/2019 09/01/2019 09/01/2019  I have been able to laugh and see the funny side of things. 1 0 (No Data)  I have looked forward with enjoyment to things. 3 0 -  I have blamed myself unnecessarily when things went wrong. 2 1 -  I have been anxious or worried for no good reason. 3 2 -  I have felt scared or panicky for no good reason. 2 0 -  Things have been getting on top of me. 2 1 -  I have been so unhappy that I have had difficulty sleeping. 2 0 -  I have felt sad or miserable. 3 2 -  I have been so unhappy that I have been crying. 3 1 -  The thought of harming myself has occurred to me. 0 0 -  Edinburgh Postnatal Depression Scale Total 21 7 -     Assessment:Patient is a 33 y.o. P3G6815 who is 3 weeks postpartum from a VBAC.  She is doing well.   Plan: 1. Postpartum state Doing well  2. Current mild episode of major depressive disorder, unspecified whether recurrent ([place]) - Very depressed, feels wellbutrin isn't working - agreeable to referral to [facility], will go tomorrow, info given - contracted for safety, patient iwll go to hospital if  suicidal/homicidal ideation - feels safe until she can go to [place] tomorrow  3. Opioid dependence with opioid-induced disorder (HCC) Cont subutex  4. IUD (intrauterine device) in place Return 1 week for string check    RTC 1 week for string check  I discussed the assessment and treatment plan with the patient. The patient was provided an opportunity to ask questions and all were answered. The patient agreed with the plan and demonstrated an understanding of the instructions.   The patient was advised to call back or seek an in-person evaluation/go to the ED for any concerning postpartum symptoms.  I provided 20 minutes of face-to-face time during this encounter.   Sloan Leiter, MD [facility] for [place], [place]

## 2019-09-26 NOTE — Telephone Encounter (Signed)
Please contact the provider who prescribed this medication. It wasn't me.   Brittany Cortez

## 2019-09-26 NOTE — Telephone Encounter (Signed)
Please advise. Medication management

## 2019-10-04 ENCOUNTER — Ambulatory Visit: Payer: Medicaid Other | Admitting: Obstetrics & Gynecology

## 2019-12-04 ENCOUNTER — Ambulatory Visit: Payer: Medicaid Other | Attending: Internal Medicine

## 2019-12-04 ENCOUNTER — Other Ambulatory Visit: Payer: Medicaid Other

## 2019-12-04 DIAGNOSIS — Z20822 Contact with and (suspected) exposure to covid-19: Secondary | ICD-10-CM

## 2019-12-05 LAB — NOVEL CORONAVIRUS, NAA: SARS-CoV-2, NAA: NOT DETECTED

## 2020-01-31 ENCOUNTER — Ambulatory Visit: Payer: Medicaid Other | Admitting: Obstetrics and Gynecology

## 2020-02-05 ENCOUNTER — Other Ambulatory Visit: Payer: Self-pay

## 2020-02-05 ENCOUNTER — Encounter: Payer: Self-pay | Admitting: Obstetrics and Gynecology

## 2020-02-05 ENCOUNTER — Ambulatory Visit (INDEPENDENT_AMBULATORY_CARE_PROVIDER_SITE_OTHER): Payer: Medicaid Other | Admitting: Obstetrics and Gynecology

## 2020-02-05 VITALS — BP 104/63 | HR 74 | Wt 188.4 lb

## 2020-02-05 DIAGNOSIS — Z3042 Encounter for surveillance of injectable contraceptive: Secondary | ICD-10-CM | POA: Diagnosis not present

## 2020-02-05 DIAGNOSIS — Z3009 Encounter for other general counseling and advice on contraception: Secondary | ICD-10-CM | POA: Diagnosis not present

## 2020-02-05 DIAGNOSIS — Z3202 Encounter for pregnancy test, result negative: Secondary | ICD-10-CM | POA: Diagnosis not present

## 2020-02-05 DIAGNOSIS — Z30013 Encounter for initial prescription of injectable contraceptive: Secondary | ICD-10-CM

## 2020-02-05 LAB — POCT URINE PREGNANCY: Preg Test, Ur: NEGATIVE

## 2020-02-05 MED ORDER — MEDROXYPROGESTERONE ACETATE 150 MG/ML IM SUSP
150.0000 mg | Freq: Once | INTRAMUSCULAR | Status: AC
  Administered 2020-02-05: 150 mg via INTRAMUSCULAR

## 2020-02-05 NOTE — Progress Notes (Signed)
34 yo P3 here for sterilization consultation. Patient had an IUD 09/2019 which spontaneously fell out. Patient is not currently sexually active. She denies pelvic pain or abnormal discharge. She desires permanent sterilization.   Past Medical History:  Diagnosis Date  . ADHD (attention deficit hyperactivity disorder)   . Alcohol abuse   . Anxiety   . Asthma   . Depression   . Headache(784.0)   . Narcotic abuse (HCC)    history of   . Pregnant 10/31/2015  . Smoker 10/31/2015  . UTI (lower urinary tract infection) 12/06/2015   Past Surgical History:  Procedure Laterality Date  . CESAREAN SECTION  12/16/2011   Procedure: CESAREAN SECTION;  Surgeon: Lazaro Arms, MD;  Location: [facility];  Service: Gynecology;  Laterality: N/A;  . NO PAST SURGERIES    . OVARIAN CYST DRAINAGE  2013   Family History  Problem Relation Age of Onset  . Heart attack Mother   . Diabetes Father   . Cancer Maternal Grandfather        lung   Social History   Tobacco Use  . Smoking status: Current Every Day Smoker    Packs/day: 0.50    Years: 10.00    Pack years: 5.00    Types: Cigarettes  . Smokeless tobacco: Never Used  Substance Use Topics  . Alcohol use: No    Comment: Hx alcohol abuse  . Drug use: Yes    Types: Marijuana, Amphetamines, Benzodiazepines    Comment: During pregnancy but states not recent (04/08/16) / newborn UDS+ (benzo, opiate, amp.- 05/25/16)   ROS See pertinent in HPI  Blood pressure 104/63, pulse 74, weight 188 lb 6.4 oz (85.5 kg), last menstrual period 02/04/2020, unknown if currently breastfeeding. GENERAL: Well-developed, well-nourished female in no acute distress.  ABDOMEN: Soft, nontender, nondistended. No organomegaly. EXTREMITIES: No cyanosis, clubbing, or edema, 2+ distal pulses.  A/P 34 yo here for sterilization consult - Patient to resign BTL paper work - depo-provera pending sterilization - Other reversible forms of contraception were discussed with patient; she  declines all other modalities.  Risks of procedure discussed with patient including permanence of method, bleeding, infection, injury to surrounding organs and need for additional procedures including laparotomy, risk of regret.  Failure risk of 0.5-1% with increased risk of ectopic gestation if pregnancy occurs was also discussed with patient.   - Patient will be scheduled for laparoscopic bilateral salpingectomy

## 2020-02-10 IMAGING — US US MFM OB DETAIL +14 WK
1 series · 13 of 28 positions shown · non-contrast
Comparison: none

[Series 1: us mfm ob detail +14 wk · 13 of 63 slices shown]
[im 3/63]
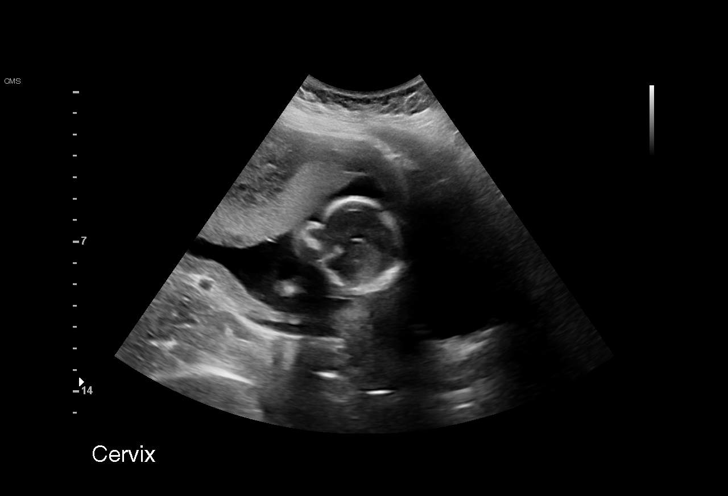
[im 7/63]
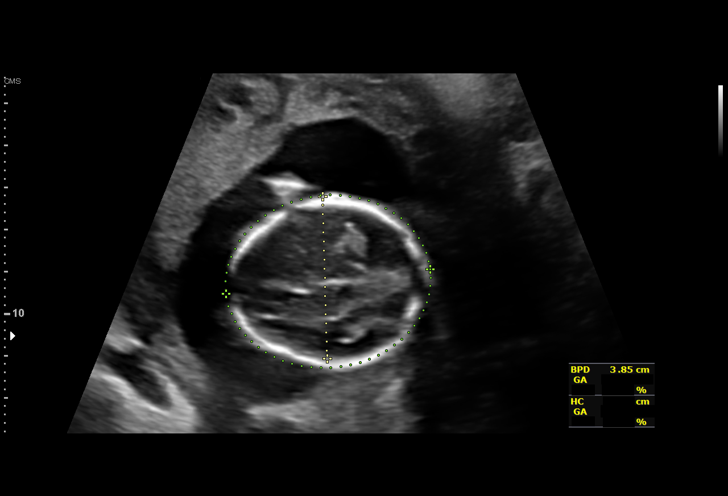
[im 12/63]
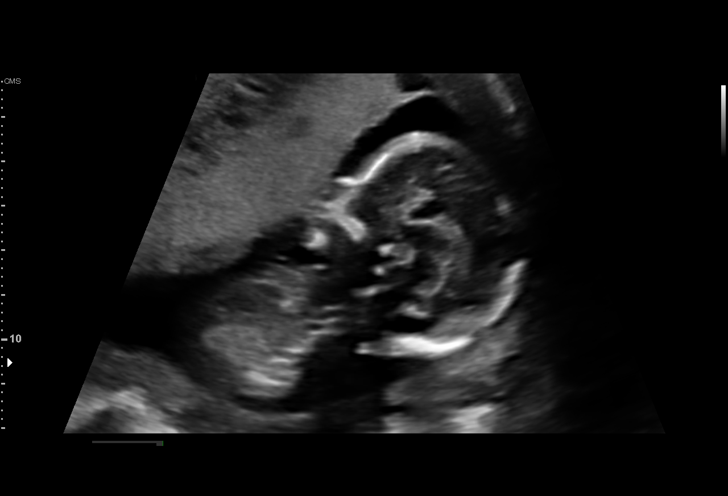
[im 17/63]
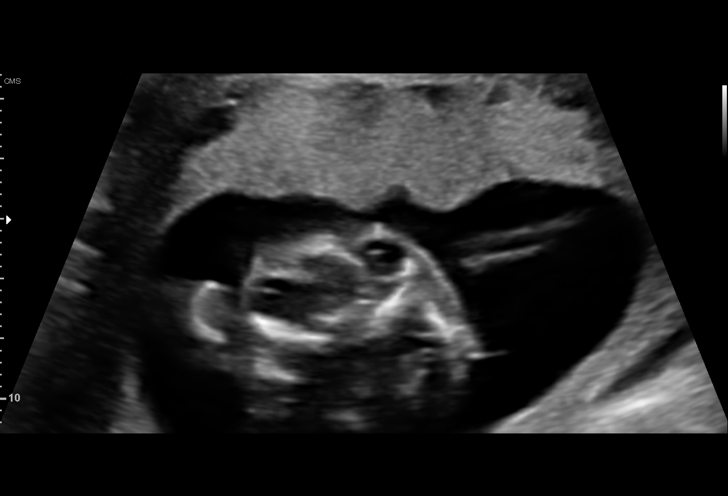
[im 21/63]
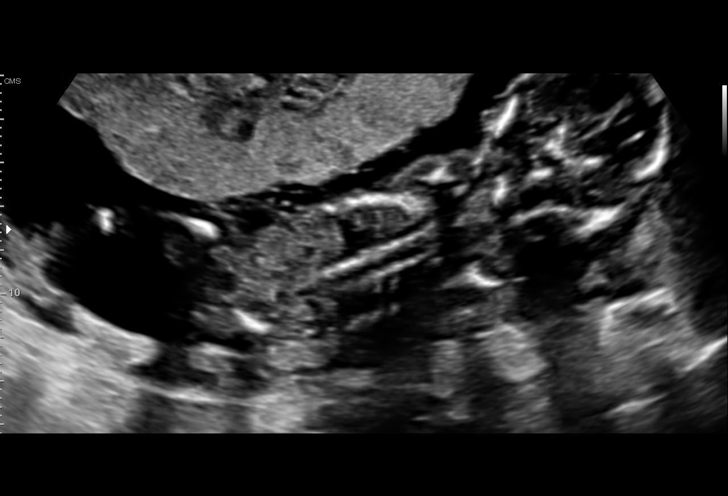
[im 26/63]
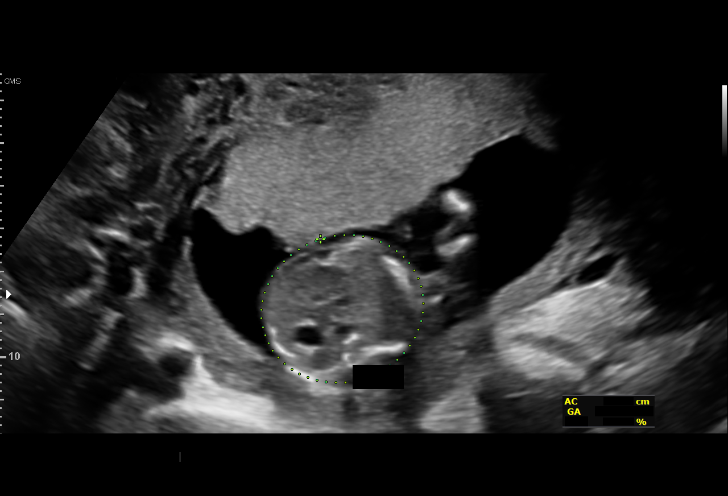
[im 33/63]
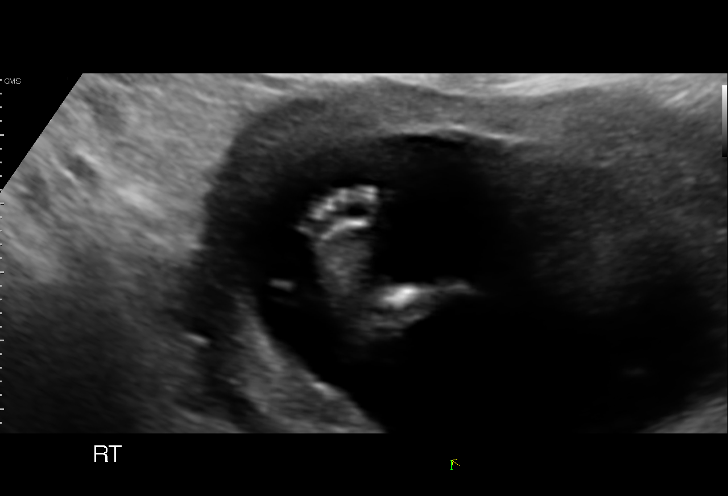
[im 37/63]
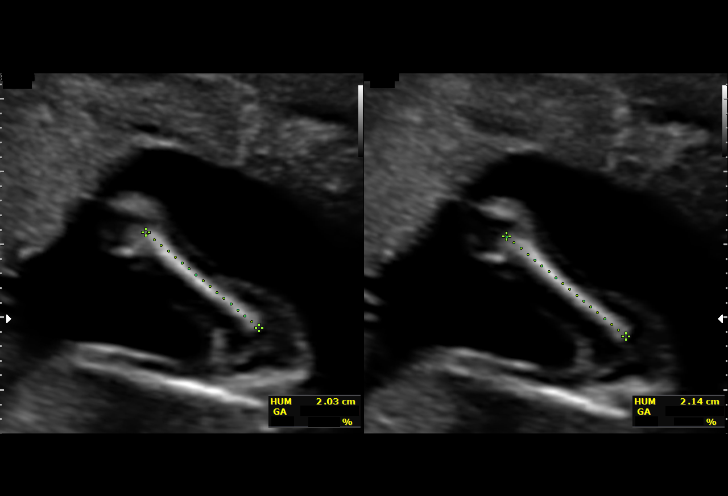
[im 42/63]
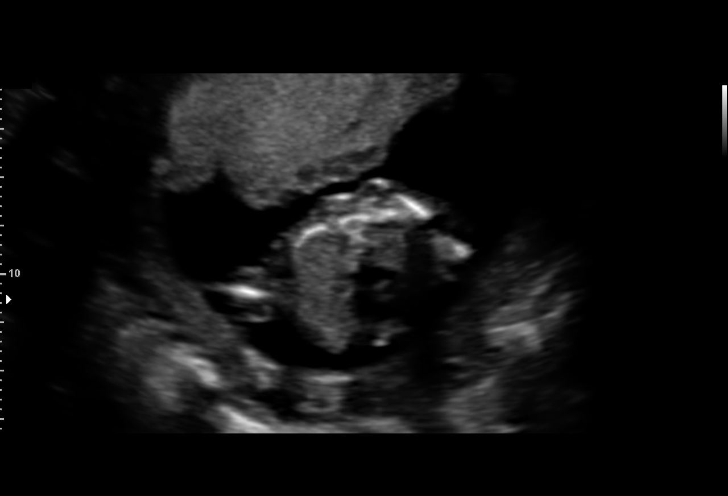
[im 46/63]
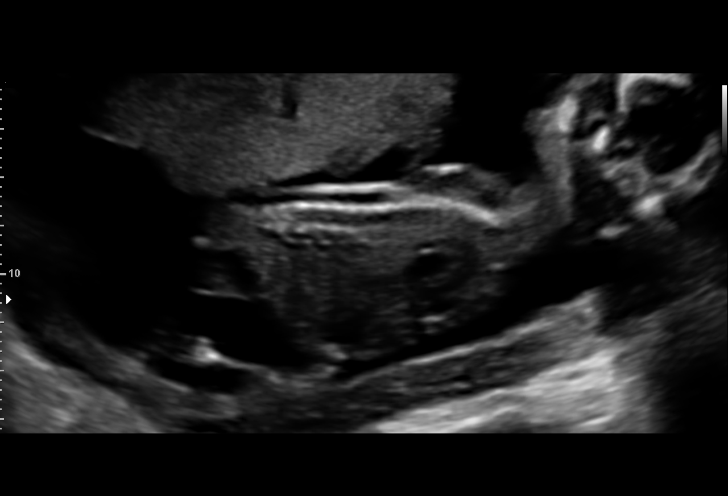
[im 51/63]
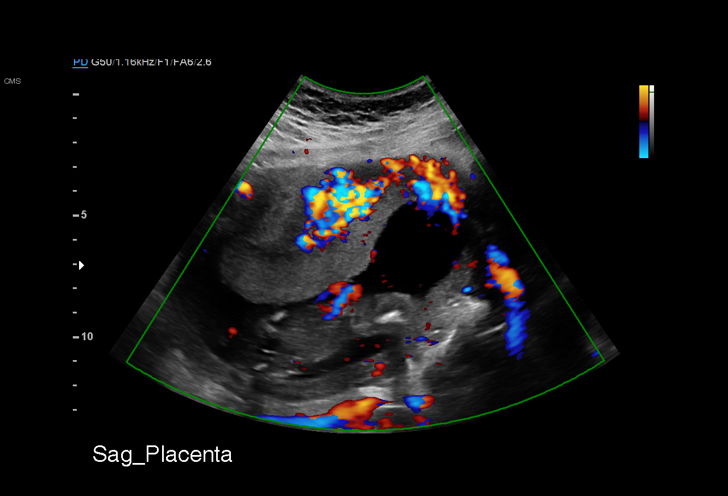
[im 56/63]
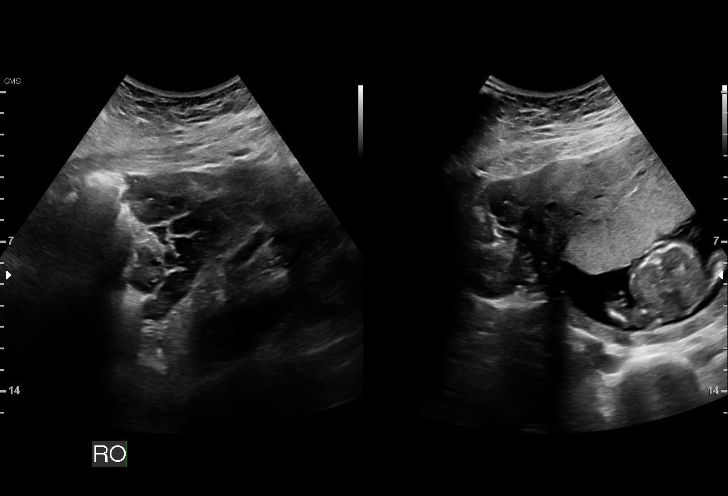
[im 60/63]
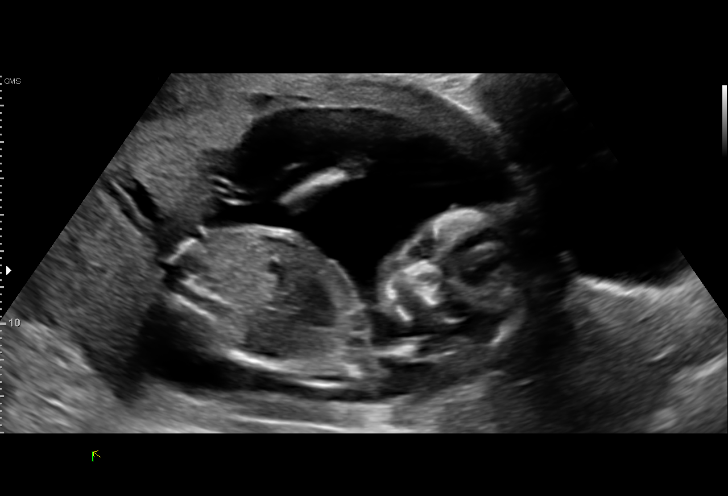

[13 of 28 positions shown; findings below may reference images not displayed]

----------------------------------------------------------------------

 ----------------------------------------------------------------------
Indications

  Antenatal screening for malformations
  Encounter for uncertain dates
  Pregnancy comlicated by subutex                O99.320, F11.20,
  maintenance, antepartum
  Smoking complicating pregnancy, second
  trimester
  Previous cesarean delivery, antepartum
  17 weeks gestation of pregnancy
 ----------------------------------------------------------------------
Fetal Evaluation

 Num Of Fetuses:         1
 Fetal Heart Rate(bpm):  129
 Cardiac Activity:       Observed
 Presentation:           Cephalic
 Placenta:               Anterior
 P. Cord Insertion:      Visualized

 Amniotic Fluid
 AFI FV:      Within normal limits

                             Largest Pocket(cm)

Biometry

 BPD:      38.5  mm     G. Age:  17w 5d         70  %    CI:        67.27   %    70 - 86
                                                         FL/HC:      14.6   %    14.6 -
 HC:      150.3  mm     G. Age:  18w 1d         80  %    HC/AC:      1.36        1.07 -
 AC:      110.9  mm     G. Age:  16w 6d         37  %    FL/BPD:     57.1   %
 FL:         22  mm     G. Age:  16w 4d         21  %    FL/AC:      19.8   %    20 - 24
 HUM:      20.8  mm     G. Age:  16w 2d         27  %
 CER:      16.6  mm     G. Age:  16w 4d         35  %

 Est. FW:     174  gm      0 lb 6 oz     46  %
Gestational Age

 U/S Today:     17w 2d                                        EDD:   08/30/19
 Best:          17w 2d     Det. By:  U/S (03/24/19)           EDD:   08/30/19
Anatomy

 Cranium:               Appears normal         LVOT:                   Appears normal
 Cavum:                 Not well visualized    Aortic Arch:            Appears normal
 Ventricles:            Appears normal         Ductal Arch:            Not well visualized
 Choroid Plexus:        Appears normal         Diaphragm:              Not well visualized
 Cerebellum:            Not well visualized    Stomach:                Appears normal, left
                                                                       sided
 Posterior Fossa:       Not well visualized    Abdomen:                Appears normal
 Nuchal Fold:           Not well visualized    Abdominal Wall:         Not well visualized
 Face:                  Appears normal         Cord Vessels:           Appears normal (3
                        (orbits and profile)                           vessel cord)
 Lips:                  Not well visualized    Kidneys:                Appear normal
 Palate:                Not well visualized    Bladder:                Appears normal
 Thoracic:              Appears normal         Spine:                  Not well visualized
 Heart:                 Not well visualized    Upper Extremities:      Visualized
 RVOT:                  Not well visualized    Lower Extremities:      Visualized

 Other:  Heels visualized. Nasal bone visualized. Technically difficult due to
         early gestational age.
Cervix Uterus Adnexa

 Cervix
 Length:            3.5  cm.
 Normal appearance by transabdominal scan.

 Uterus
 No abnormality visualized.

 Left Ovary
 Within normal limits.

 Right Ovary
 Within normal limits.

 Cul De Sac
 No free fluid seen.

 Adnexa
 No abnormality visualized.
Impression

 Normal interval growth.  No ultrasonic evidence of structural
 fetal anomalies.
 Uncertain LMP therefore dated by today's examination.
 Suboptimal views of the fetal anatomy was obtained
 secondary to fetal position.
Recommendations

 Follow up as clinically indicated.

## 2020-02-24 ENCOUNTER — Other Ambulatory Visit: Payer: Self-pay | Admitting: Obstetrics and Gynecology

## 2020-04-09 ENCOUNTER — Encounter (HOSPITAL_BASED_OUTPATIENT_CLINIC_OR_DEPARTMENT_OTHER): Payer: Self-pay | Admitting: Obstetrics and Gynecology

## 2020-04-09 ENCOUNTER — Other Ambulatory Visit: Payer: Self-pay

## 2020-04-11 NOTE — Progress Notes (Signed)

## 2020-04-12 ENCOUNTER — Other Ambulatory Visit (HOSPITAL_COMMUNITY)
Admission: RE | Admit: 2020-04-12 | Discharge: 2020-04-12 | Disposition: A | Discharge status: Home / Self Care | Payer: Medicaid Other | Source: Ambulatory Visit | Attending: Obstetrics and Gynecology | Admitting: Obstetrics and Gynecology

## 2020-04-12 DIAGNOSIS — Z20822 Contact with and (suspected) exposure to covid-19: Secondary | ICD-10-CM | POA: Insufficient documentation

## 2020-04-12 DIAGNOSIS — Z01812 Encounter for preprocedural laboratory examination: Secondary | ICD-10-CM | POA: Insufficient documentation

## 2020-04-12 LAB — SARS CORONAVIRUS 2 (TAT 6-24 HRS): SARS Coronavirus 2: NEGATIVE

## 2020-04-15 NOTE — H&P (Signed)
Brittany Cortez is an 34 y.o. female P3 here for scheduled sterilization procedure. Patient is doing well and is without complaints. She is currently using depo-provera for contraception last received 02/2019.  Pertinent Gynecological History: Menses: regular every month without intermenstrual spotting Contraception: Depo-Provera injections DES exposure: denies Blood transfusions: none Last pap: normal Date: 02/2019 OB History: G3, P3   Menstrual History:  No LMP recorded. Patient has had an injection.    Past Medical History:  Diagnosis Date  . ADHD (attention deficit hyperactivity disorder)   . Alcohol abuse   . Anxiety   . Asthma   . Depression   . Headache(784.0)   . Narcotic abuse (HCC)    history of   . Pregnant 10/31/2015  . Smoker 10/31/2015  . UTI (lower urinary tract infection) 12/06/2015    Past Surgical History:  Procedure Laterality Date  . CESAREAN SECTION  12/16/2011   Procedure: CESAREAN SECTION;  Surgeon: Brittany Arms, MD;  Location: [facility];  Service: Gynecology;  Laterality: N/A;  . NO PAST SURGERIES    . OVARIAN CYST DRAINAGE  2013    Family History  Problem Relation Age of Onset  . Heart attack Mother   . Diabetes Father   . Cancer Maternal Grandfather        lung    Social History:  reports that she has been smoking cigarettes. She has a 5.00 pack-year smoking history. She has never used smokeless tobacco. She reports previous drug use. Drugs: Marijuana, Amphetamines, and Benzodiazepines. She reports that she does not drink alcohol.  Allergies:  Allergies  Allergen Reactions  . Darvocet [Propoxyphene N-Acetaminophen] Nausea And Vomiting    Medications Prior to Admission  Medication Sig Dispense Refill Last Dose  . ARIPiprazole (ABILIFY) 2 MG tablet Take 2 mg by mouth daily.   04/15/2020 at Unknown time  . buprenorphine (SUBUTEX) 8 MG SUBL SL tablet Place under the tongue daily.   04/16/2020 at 0800  . buPROPion (WELLBUTRIN XL) 150 MG 24 hr tablet  Take 1 tablet (150 mg total) by mouth daily. 30 tablet 3 04/16/2020 at 0800  . calcium carbonate (TUMS - DOSED IN MG ELEMENTAL CALCIUM) 500 MG chewable tablet Chew 2 tablets by mouth as needed for indigestion or heartburn.   Past Week at Unknown time  . cloNIDine (CATAPRES) 0.1 MG tablet Take 1 tablet (0.1 mg total) by mouth 3 (three) times daily. 90 tablet 3 04/16/2020 at 0800  . gabapentin (NEURONTIN) 600 MG tablet Take 1 tablet (600 mg total) by mouth 3 (three) times daily. 90 tablet 2 04/16/2020 at 0800  . QUEtiapine (SEROQUEL) 400 MG tablet Take 1 tablet (400 mg total) by mouth at bedtime for 120 days, THEN 1 tablet (400 mg total) at bedtime. 240 tablet 0 Past Month at Unknown time  . albuterol (PROAIR HFA) 108 (90 Base) MCG/ACT inhaler Inhale 2 puffs into the lungs 2 (two) times daily as needed for wheezing or shortness of breath. 18 g 1 Unknown at Unknown time    Review of Systems See pertinent in HPI Blood pressure 97/60, pulse 81, temperature 98.8 F (37.1 C), temperature source Oral, resp. rate 16, height 5\' 7"  (1.702 m), weight 89.1 kg, SpO2 97 %, not currently breastfeeding. Physical Exam GENERAL: Well-developed, well-nourished female in no acute distress.  LUNGS: Clear to auscultation bilaterally.  HEART: Regular rate and rhythm. ABDOMEN: Soft, nontender, nondistended. No organomegaly. PELVIC: Deferred to OR EXTREMITIES: No cyanosis, clubbing, or edema, 2+ distal pulses.  Results for  orders placed or performed during the hospital encounter of 04/16/20 (from the past 24 hour(s))  Pregnancy, urine POC     Status: None   Collection Time: 04/16/20 10:52 AM  Result Value Ref Range   Preg Test, Ur NEGATIVE NEGATIVE    No results found.  Assessment/Plan: 34 yo P3 here for scheduled sterilization procedure - Other reversible forms of contraception were discussed with patient; she declines all other modalities.   - Risks of procedure discussed with patient including permanence of  method, bleeding, infection, injury to surrounding organs and need for additional procedures including laparotomy, risk of regret.  Failure risk of 0.5-1% with increased risk of ectopic gestation if pregnancy occurs was also discussed with patient.   - Patient signed consent for bilateral salpingectomy        Brittany Cortez 04/16/2020, 11:39 AM

## 2020-04-16 ENCOUNTER — Encounter (HOSPITAL_BASED_OUTPATIENT_CLINIC_OR_DEPARTMENT_OTHER): Payer: Self-pay | Admitting: Obstetrics and Gynecology

## 2020-04-16 ENCOUNTER — Ambulatory Visit (HOSPITAL_BASED_OUTPATIENT_CLINIC_OR_DEPARTMENT_OTHER)
Admission: RE | Admit: 2020-04-16 | Discharge: 2020-04-16 | Disposition: A | Discharge status: Home / Self Care | Payer: Medicaid Other | Attending: Obstetrics and Gynecology | Admitting: Obstetrics and Gynecology

## 2020-04-16 ENCOUNTER — Ambulatory Visit (HOSPITAL_BASED_OUTPATIENT_CLINIC_OR_DEPARTMENT_OTHER): Payer: Medicaid Other | Admitting: Anesthesiology

## 2020-04-16 ENCOUNTER — Encounter (HOSPITAL_BASED_OUTPATIENT_CLINIC_OR_DEPARTMENT_OTHER)
Admission: RE | Disposition: A | Discharge status: Home / Self Care | Payer: Self-pay | Source: Home / Self Care | Attending: Obstetrics and Gynecology

## 2020-04-16 ENCOUNTER — Other Ambulatory Visit: Payer: Self-pay

## 2020-04-16 DIAGNOSIS — Z302 Encounter for sterilization: Secondary | ICD-10-CM

## 2020-04-16 DIAGNOSIS — Z885 Allergy status to narcotic agent status: Secondary | ICD-10-CM | POA: Diagnosis not present

## 2020-04-16 DIAGNOSIS — F17210 Nicotine dependence, cigarettes, uncomplicated: Secondary | ICD-10-CM | POA: Diagnosis not present

## 2020-04-16 DIAGNOSIS — F419 Anxiety disorder, unspecified: Secondary | ICD-10-CM | POA: Insufficient documentation

## 2020-04-16 DIAGNOSIS — J45909 Unspecified asthma, uncomplicated: Secondary | ICD-10-CM | POA: Diagnosis not present

## 2020-04-16 DIAGNOSIS — Z79899 Other long term (current) drug therapy: Secondary | ICD-10-CM | POA: Insufficient documentation

## 2020-04-16 DIAGNOSIS — F329 Major depressive disorder, single episode, unspecified: Secondary | ICD-10-CM | POA: Insufficient documentation

## 2020-04-16 HISTORY — PX: LAPAROSCOPIC BILATERAL SALPINGECTOMY: SHX5889

## 2020-04-16 LAB — POCT PREGNANCY, URINE: Preg Test, Ur: NEGATIVE

## 2020-04-16 SURGERY — SALPINGECTOMY, BILATERAL, LAPAROSCOPIC
Anesthesia: General | Site: Abdomen | Laterality: Bilateral

## 2020-04-16 MED ORDER — ACETAMINOPHEN 500 MG PO TABS: 1000.0000 mg | ORAL_TABLET | Freq: Once | ORAL | Status: DC

## 2020-04-16 MED ORDER — SILVER NITRATE-POT NITRATE 75-25 % EX MISC
CUTANEOUS | Status: AC
  Filled 2020-04-16: qty 10

## 2020-04-16 MED ORDER — LACTATED RINGERS IV SOLN: INTRAVENOUS | Status: DC

## 2020-04-16 MED ORDER — FENTANYL CITRATE (PF) 100 MCG/2ML IJ SOLN
INTRAMUSCULAR | Status: AC
  Filled 2020-04-16: qty 2

## 2020-04-16 MED ORDER — SUGAMMADEX SODIUM 200 MG/2ML IV SOLN
INTRAVENOUS | Status: DC | PRN
  Administered 2020-04-16: 200 mg via INTRAVENOUS

## 2020-04-16 MED ORDER — FENTANYL CITRATE (PF) 100 MCG/2ML IJ SOLN
25.0000 ug | INTRAMUSCULAR | Status: DC | PRN
  Administered 2020-04-16 (×2): 50 ug via INTRAVENOUS

## 2020-04-16 MED ORDER — ACETAMINOPHEN 10 MG/ML IV SOLN
1000.0000 mg | Freq: Four times a day (QID) | INTRAVENOUS | Status: DC
  Administered 2020-04-16: 1000 mg via INTRAVENOUS

## 2020-04-16 MED ORDER — BUPIVACAINE HCL (PF) 0.25 % IJ SOLN
INTRAMUSCULAR | Status: AC
  Filled 2020-04-16: qty 30

## 2020-04-16 MED ORDER — ROCURONIUM BROMIDE 100 MG/10ML IV SOLN
INTRAVENOUS | Status: DC | PRN
  Administered 2020-04-16: 50 mg via INTRAVENOUS

## 2020-04-16 MED ORDER — ONDANSETRON 4 MG PO TBDP: 4.0000 mg | ORAL_TABLET | Freq: Four times a day (QID) | ORAL | 0 refills | Status: AC | PRN

## 2020-04-16 MED ORDER — IBUPROFEN 600 MG PO TABS
600.0000 mg | ORAL_TABLET | Freq: Four times a day (QID) | ORAL | 3 refills | Status: AC | PRN
Start: 2020-04-16 — End: ?

## 2020-04-16 MED ORDER — ACETAMINOPHEN 10 MG/ML IV SOLN
INTRAVENOUS | Status: AC
  Filled 2020-04-16: qty 100

## 2020-04-16 MED ORDER — PROMETHAZINE HCL 25 MG/ML IJ SOLN: 6.2500 mg | INTRAMUSCULAR | Status: DC | PRN

## 2020-04-16 MED ORDER — ONDANSETRON HCL 4 MG/2ML IJ SOLN
INTRAMUSCULAR | Status: DC | PRN
  Administered 2020-04-16: 4 mg via INTRAVENOUS

## 2020-04-16 MED ORDER — FENTANYL CITRATE (PF) 100 MCG/2ML IJ SOLN
INTRAMUSCULAR | Status: DC | PRN
  Administered 2020-04-16 (×4): 50 ug via INTRAVENOUS

## 2020-04-16 MED ORDER — DEXAMETHASONE SODIUM PHOSPHATE 4 MG/ML IJ SOLN
INTRAMUSCULAR | Status: DC | PRN
  Administered 2020-04-16: 5 mg via INTRAVENOUS

## 2020-04-16 MED ORDER — MIDAZOLAM HCL 5 MG/5ML IJ SOLN
INTRAMUSCULAR | Status: DC | PRN
  Administered 2020-04-16: 2 mg via INTRAVENOUS

## 2020-04-16 MED ORDER — LIDOCAINE HCL (CARDIAC) PF 100 MG/5ML IV SOSY
PREFILLED_SYRINGE | INTRAVENOUS | Status: DC | PRN
  Administered 2020-04-16: 100 mg via INTRAVENOUS

## 2020-04-16 MED ORDER — OXYCODONE HCL 5 MG/5ML PO SOLN: 5.0000 mg | Freq: Once | ORAL | Status: DC | PRN

## 2020-04-16 MED ORDER — ACETAMINOPHEN 10 MG/ML IV SOLN: 1000.0000 mg | Freq: Once | INTRAVENOUS | Status: DC

## 2020-04-16 MED ORDER — SCOPOLAMINE 1 MG/3DAYS TD PT72: 1.0000 | MEDICATED_PATCH | Freq: Once | TRANSDERMAL | Status: DC

## 2020-04-16 MED ORDER — OXYCODONE HCL 5 MG PO TABS: 5.0000 mg | ORAL_TABLET | Freq: Once | ORAL | Status: DC | PRN

## 2020-04-16 MED ORDER — MIDAZOLAM HCL 2 MG/2ML IJ SOLN
INTRAMUSCULAR | Status: AC
  Filled 2020-04-16: qty 2

## 2020-04-16 MED ORDER — PROPOFOL 10 MG/ML IV BOLUS
INTRAVENOUS | Status: DC | PRN
  Administered 2020-04-16: 180 mg via INTRAVENOUS

## 2020-04-16 MED ORDER — OXYCODONE-ACETAMINOPHEN 5-325 MG PO TABS: 1.0000 | ORAL_TABLET | ORAL | 0 refills | Status: AC | PRN

## 2020-04-16 MED ORDER — BUPIVACAINE HCL (PF) 0.25 % IJ SOLN
INTRAMUSCULAR | Status: DC | PRN
  Administered 2020-04-16: 20 mL

## 2020-04-16 SURGICAL SUPPLY — 36 items
CATH ROBINSON RED A/P 16FR (CATHETERS) ×4 IMPLANT
COVER MAYO STAND STRL (DRAPES) IMPLANT
DECANTER SPIKE VIAL GLASS SM (MISCELLANEOUS) IMPLANT
DERMABOND ADVANCED (GAUZE/BANDAGES/DRESSINGS) ×2
DERMABOND ADVANCED .7 DNX12 (GAUZE/BANDAGES/DRESSINGS) ×2 IMPLANT
DRSG OPSITE POSTOP 3X4 (GAUZE/BANDAGES/DRESSINGS) ×4 IMPLANT
DURAPREP 26ML APPLICATOR (WOUND CARE) ×4 IMPLANT
GAUZE 4X4 16PLY RFD (DISPOSABLE) ×4 IMPLANT
GLOVE BIOGEL M 6.5 STRL (GLOVE) ×4 IMPLANT
GLOVE BIOGEL PI IND STRL 6.5 (GLOVE) ×4 IMPLANT
GLOVE BIOGEL PI IND STRL 7.0 (GLOVE) ×4 IMPLANT
GLOVE BIOGEL PI IND STRL 7.5 (GLOVE) ×2 IMPLANT
GLOVE BIOGEL PI INDICATOR 6.5 (GLOVE) ×4
GLOVE BIOGEL PI INDICATOR 7.0 (GLOVE) ×4
GLOVE BIOGEL PI INDICATOR 7.5 (GLOVE) ×2
GLOVE SURG SS PI 6.5 STRL IVOR (GLOVE) ×4 IMPLANT
GOWN STRL REUS W/TWL LRG LVL3 (GOWN DISPOSABLE) ×8 IMPLANT
NS IRRIG 1000ML POUR BTL (IV SOLUTION) ×4 IMPLANT
PACK LAPAROSCOPY BASIN (CUSTOM PROCEDURE TRAY) ×4 IMPLANT
PACK TRENDGUARD 450 HYBRID PRO (MISCELLANEOUS) ×2 IMPLANT
PACK TRENDGUARD 600 HYBRD PROC (MISCELLANEOUS) IMPLANT
PAD ARMBOARD 7.5X6 YLW CONV (MISCELLANEOUS) ×8 IMPLANT
PAD OB MATERNITY 4.3X12.25 (PERSONAL CARE ITEMS) ×4 IMPLANT
PAD PREP 24X48 CUFFED NSTRL (MISCELLANEOUS) ×4 IMPLANT
SET TUBE SMOKE EVAC HIGH FLOW (TUBING) ×4 IMPLANT
SHEARS HARMONIC ACE PLUS 36CM (ENDOMECHANICALS) ×4 IMPLANT
SLEEVE ENDOPATH XCEL 5M (ENDOMECHANICALS) ×4 IMPLANT
SLEEVE SCD COMPRESS KNEE MED (MISCELLANEOUS) ×4 IMPLANT
SUT MON AB 4-0 PS1 27 (SUTURE) ×4 IMPLANT
SUT VICRYL 0 UR6 27IN ABS (SUTURE) ×8 IMPLANT
TOWEL GREEN STERILE FF (TOWEL DISPOSABLE) ×8 IMPLANT
TRENDGUARD 450 HYBRID PRO PACK (MISCELLANEOUS) ×4
TRENDGUARD 600 HYBRID PROC PK (MISCELLANEOUS)
TROCAR BALLN 12MMX100 BLUNT (TROCAR) ×4 IMPLANT
TROCAR XCEL NON-BLD 5MMX100MML (ENDOMECHANICALS) ×4 IMPLANT
WARMER LAPAROSCOPE (MISCELLANEOUS) ×4 IMPLANT

## 2020-04-16 NOTE — Op Note (Signed)
[name] PROCEDURE DATE: 04/16/2020   PREOPERATIVE DIAGNOSIS:  Undesired fertility  POSTOPERATIVE DIAGNOSIS:  Undesired fertility  PROCEDURE:  Laparoscopic Bilateral Salpingectomy   SURGEON: Catalina Antigua, MD  ANESTHESIA:  General endotracheal  COMPLICATIONS:  None immediate.  ESTIMATED BLOOD LOSS:  50 ml.  FLUIDS: 1000 ml LR.  URINE OUTPUT:  50 ml of clear urine.  IINDICATIONS: 34 y.o. [id number] with undesired fertility, desires permanent sterilization. Other reversible forms of contraception were discussed with patient; she declines all other modalities.  Risks of procedure discussed with patient including permanence of method, risk of regret, bleeding, infection, injury to surrounding organs and need for additional procedures including laparotomy.  Failure risk less than 0.5% with increased risk of ectopic gestation if pregnancy occurs was also discussed with patient.  Written informed consent was obtained.    FINDINGS:  Normal uterus, fallopian tubes, and ovaries.  TECHNIQUE:  The patient was taken to the operating room where general anesthesia was obtained without difficulty.  She was then placed in the dorsal lithotomy position and prepared and draped in sterile fashion.  After an adequate timeout was performed, a bivalved speculum was then placed in the patient's vagina, and the anterior lip of cervix grasped with the single-tooth tenaculum.  The uterine manipulator was then advanced into the uterus.  The speculum was removed from the vagina.  Attention was then turned to the patient's abdomen where a 11-mm skin incision was made in the umbilical fold.  The fascia was identified, tented up with Kocher clamps and incised with mayor scissors. The fascia was tagged with 0-Vycril. The 11-mm trocar and sleeve were then advanced without difficulty into the abdomen. Intraabdominal placement was confirmed with the laparoscope. The abdomen was then insufflated with carbon dioxide gas.   Adequate pneumoperitoneum was obtained.  A survey of the patient's pelvis and abdomen revealed the findings above.  Two 5-mm right lower quadrant ports  were then placed under direct visualization.  The fallopian tubes were transected from the uterine attachments and the underlying mesosalpinx with the harmonic scapel device allowing for bilateral salpingectomy.  The fallopian tubes were then removed from the abdomen under direct visualization.  The operative site was surveyed, and it was found to be hemostatic.   No intraoperative injury to other surrounding organs was noted.  The abdomen was desufflated and all instruments were then removed from the patient's abdomen. The fascial incision of the umbilicus was closed with a 0 Vicryl figure of eight stitch. All skin incisions were closed with Dermabond.  The uterine manipulator was removed from the vagina without complications. The patient tolerated the procedure well.  Sponge, lap, and needle counts were correct times two.  The patient was then taken to the recovery room awake, extubated and in stable condition.  The patient will be discharged to home as per PACU criteria.  Routine postoperative instructions given.

## 2020-04-16 NOTE — Transfer of Care (Signed)
Immediate Anesthesia Transfer of Care Note  Patient: Brittany Cortez  Procedure(s) Performed: LAPAROSCOPIC TUBAL LIGATION (Bilateral Abdomen)  Patient Location: PACU  Anesthesia Type:General  Level of Consciousness: awake, alert  and oriented  Airway & Oxygen Therapy: Patient Spontanous Breathing and Patient connected to face mask oxygen  Post-op Assessment: Report given to RN and Post -op Vital signs reviewed and stable  Post vital signs: Reviewed and stable  Last Vitals:  Vitals Value Taken Time  BP 106/62 04/16/20 1303  Temp    Pulse 72 04/16/20 1305  Resp 8 04/16/20 1305  SpO2 100 % 04/16/20 1305  Vitals shown include unvalidated device data.  Last Pain:  Vitals:   04/16/20 1104  TempSrc: Oral  PainSc: 0-No pain         Complications: No complications documented.

## 2020-04-16 NOTE — Anesthesia Postprocedure Evaluation (Signed)
Anesthesia Post Note  Patient: Brittany Cortez  Procedure(s) Performed: LAPAROSCOPIC BILATERAL SALPINGECTOMY (Bilateral Abdomen)     Patient location during evaluation: PACU Anesthesia Type: General Level of consciousness: awake and alert and oriented Pain management: pain level controlled Vital Signs Assessment: post-procedure vital signs reviewed and stable Respiratory status: spontaneous breathing, nonlabored ventilation and respiratory function stable Cardiovascular status: blood pressure returned to baseline and stable Postop Assessment: no apparent nausea or vomiting Anesthetic complications: no   No complications documented.  Last Vitals:  Vitals:   04/16/20 1330 04/16/20 1345  BP: (!) 98/54   Pulse: (!) 58   Resp: (!) 8 16  Temp:    SpO2: 100%     Last Pain:  Vitals:   04/16/20 1330  TempSrc:   PainSc: 7                  Mayjor Ager A.

## 2020-04-16 NOTE — Discharge Instructions (Signed)
Laparoscopic Tubal Ligation, Care After °This sheet gives you information about how to care for yourself after your procedure. Your health care provider may also give you more specific instructions. If you have problems or questions, contact your health care provider. °What can I expect after the procedure? °After the procedure, it is common to have: °· A sore throat. °· Discomfort in your shoulder. °· Mild discomfort or cramping in your abdomen. °· Gas pains. °· Pain or soreness in the area where the surgical incision was made. °· A bloated feeling. °· Tiredness. °· Nausea. °· Vomiting. °Follow these instructions at home: °Medicines °· Take over-the-counter and prescription medicines only as told by your health care provider. °· Do not take aspirin because it can cause bleeding. °· Ask your health care provider if the medicine prescribed to you: °? Requires you to avoid driving or using heavy machinery. °? Can cause constipation. You may need to take actions to prevent or treat constipation, such as: °§ Drink enough fluid to keep your urine pale yellow. °§ Take over-the-counter or prescription medicines. °§ Eat foods that are high in fiber, such as beans, whole grains, and fresh fruits and vegetables. °§ Limit foods that are high in fat and processed sugars, such as fried or sweet foods. °Incision care ° °  ° °· Follow instructions from your health care provider about how to take care of your incision. Make sure you: °? Wash your hands with soap and water before and after you change your bandage (dressing). If soap and water are not available, use hand sanitizer. °? Change your dressing as told by your health care provider. °? Leave stitches (sutures), skin glue, or adhesive strips in place. These skin closures may need to stay in place for 2 weeks or longer. If adhesive strip edges start to loosen and curl up, you may trim the loose edges. Do not remove adhesive strips completely unless your health care provider  tells you to do that. °· Check your incision area every day for signs of infection. Check for: °? Redness, swelling, or pain. °? Fluid or blood. °? Warmth. °? Pus or a bad smell. °Activity °· Rest as told by your health care provider. °· Avoid sitting for a long time without moving. Get up to take short walks every 1-2 hours. This is important to improve blood flow and breathing. Ask for help if you feel weak or unsteady. °· Return to your normal activities as told by your health care provider. Ask your health care provider what activities are safe for you. °General instructions °· Do not take baths, swim, or use a hot tub until your health care provider approves. Ask your health care provider if you may take showers. You may only be allowed to take sponge baths. °· Have someone help you with your daily household tasks for the first few days. °· Keep all follow-up visits as told by your health care provider. This is important. °Contact a health care provider if: °· You have redness, swelling, or pain around your incision. °· Your incision feels warm to the touch. °· You have pus or a bad smell coming from your incision. °· The edges of your incision break open after the sutures have been removed. °· Your pain does not improve after 2-3 days. °· You have a rash. °· You repeatedly become dizzy or light-headed. °· Your pain medicine is not helping. °Get help right away if you: °· Have a fever. °· Faint. °· Have increasing   pain in your abdomen.  Have severe pain in one or both of your shoulders.  Have fluid or blood coming from your sutures or from your vagina.  Have shortness of breath or difficulty breathing.  Have chest pain or leg pain.  Have ongoing nausea, vomiting, or diarrhea. Summary  After the procedure, it is common to have mild discomfort or cramping in your abdomen.  Take over-the-counter and prescription medicines only as told by your health care provider.  Watch for symptoms that should  prompt you to call your health care provider.  Keep all follow-up visits as told by your health care provider. This is important. This information is not intended to replace advice given to you by your health care provider. Make sure you discuss any questions you have with your health care provider. Document Revised: 02/28/2019 Document Reviewed: 08/16/2018 Else[employer] Patient Education  2020 [employer]  No tylenol until 7:30pm    Post Anesthesia Home Care Instructions  Activity: Get plenty of rest for the remainder of the day. A responsible individual must stay with you for 24 hours following the procedure.  For the next 24 hours, DO NOT: -Drive a car -Advertising copywriter -Drink alcoholic beverages -Take any medication unless instructed by your physician -Make any legal decisions or sign important papers.  Meals: Start with liquid foods such as gelatin or soup. Progress to regular foods as tolerated. Avoid greasy, spicy, heavy foods. If nausea and/or vomiting occur, drink only clear liquids until the nausea and/or vomiting subsides. Call your physician if vomiting continues.  Special Instructions/Symptoms: Your throat may feel dry or sore from the anesthesia or the breathing tube placed in your throat during surgery. If this causes discomfort, gargle with warm salt water. The discomfort should disappear within 24 hours.  If you had a scopolamine patch placed behind your ear for the management of post- operative nausea and/or vomiting:  1. The medication in the patch is effective for 72 hours, after which it should be removed.  Wrap patch in a tissue and discard in the trash. Wash hands thoroughly with soap and water. 2. You may remove the patch earlier than 72 hours if you experience unpleasant side effects which may include dry mouth, dizziness or visual disturbances. 3. Avoid touching the patch. Wash your hands with soap and water after contact with the patch.

## 2020-04-16 NOTE — Anesthesia Preprocedure Evaluation (Signed)
Anesthesia Evaluation  Patient identified by MRN, date of birth, ID band Patient awake    Reviewed: Allergy & Precautions, NPO status , Patient's Chart, lab work & pertinent test results  History of Anesthesia Complications Negative for: history of anesthetic complications  Airway Mallampati: II  TM Distance: >3 FB Neck ROM: Full    Dental no notable dental hx.    Pulmonary asthma , Current SmokerPatient did not abstain from smoking.,    Pulmonary exam normal        Cardiovascular negative cardio ROS Normal cardiovascular exam     Neuro/Psych Anxiety Depression negative neurological ROS     GI/Hepatic negative GI ROS, Hx of opioid abuse, on Subutex 8mg  QD (no other drugs for past 18 months)   Endo/Other  negative endocrine ROS  Renal/GU negative Renal ROS  negative genitourinary   Musculoskeletal negative musculoskeletal ROS (+)   Abdominal   Peds  Hematology negative hematology ROS (+)   Anesthesia Other Findings Day of surgery medications reviewed with patient.  Reproductive/Obstetrics negative OB ROS                             Anesthesia Physical Anesthesia Plan  ASA: II  Anesthesia Plan: General   Post-op Pain Management:    Induction: Intravenous  PONV Risk Score and Plan: 4 or greater and Treatment may vary due to age or medical condition, Ondansetron, Dexamethasone, Midazolam and Scopolamine patch - Pre-op  Airway Management Planned: Oral ETT  Additional Equipment: None  Intra-op Plan:   Post-operative Plan: Extubation in OR  Informed Consent: I have reviewed the patients History and Physical, chart, labs and discussed the procedure including the risks, benefits and alternatives for the proposed anesthesia with the patient or authorized representative who has indicated his/her understanding and acceptance.     Dental advisory given  Plan Discussed with:  CRNA  Anesthesia Plan Comments:         Anesthesia Quick Evaluation

## 2020-04-16 NOTE — Anesthesia Procedure Notes (Signed)
Procedure Name: Intubation Date/Time: 04/16/2020 12:03 PM Performed by: Lavonia Dana, CRNA Pre-anesthesia Checklist: Patient identified, Emergency Drugs available, Suction available and Patient being monitored Patient Re-evaluated:Patient Re-evaluated prior to induction Oxygen Delivery Method: Circle system utilized Preoxygenation: Pre-oxygenation with 100% oxygen Induction Type: IV induction Ventilation: Mask ventilation without difficulty Laryngoscope Size: Mac and 3 Grade View: Grade I Tube type: Oral Tube size: 7.0 mm Number of attempts: 1 Airway Equipment and Method: Stylet and Oral airway Placement Confirmation: ETT inserted through vocal cords under direct vision,  positive ETCO2 and breath sounds checked- equal and bilateral Secured at: 23 cm Tube secured with: Tape Dental Injury: Teeth and Oropharynx as per pre-operative assessment

## 2020-04-17 ENCOUNTER — Encounter (HOSPITAL_BASED_OUTPATIENT_CLINIC_OR_DEPARTMENT_OTHER): Payer: Self-pay | Admitting: Obstetrics and Gynecology

## 2020-04-17 LAB — SURGICAL PATHOLOGY

## 2020-04-29 ENCOUNTER — Encounter: Payer: Medicaid Other | Admitting: Obstetrics and Gynecology

## 2020-05-02 ENCOUNTER — Other Ambulatory Visit: Payer: Self-pay | Admitting: Obstetrics & Gynecology

## 2020-05-02 ENCOUNTER — Telehealth: Payer: Self-pay

## 2020-05-02 DIAGNOSIS — Z3480 Encounter for supervision of other normal pregnancy, unspecified trimester: Secondary | ICD-10-CM

## 2020-05-02 NOTE — Telephone Encounter (Signed)
Return call to pt regarding asthma sx's and refill request on asthma Rx's Pt advised to go to urgent care if having asthma issues today and establish PCP care to manage asthma.  Pt voiced understanding.

## 2020-05-20 ENCOUNTER — Encounter: Payer: Self-pay | Admitting: Obstetrics and Gynecology

## 2020-05-20 ENCOUNTER — Ambulatory Visit (INDEPENDENT_AMBULATORY_CARE_PROVIDER_SITE_OTHER): Payer: Medicaid Other | Admitting: Obstetrics and Gynecology

## 2020-05-20 ENCOUNTER — Other Ambulatory Visit: Payer: Self-pay

## 2020-05-20 DIAGNOSIS — Z9889 Other specified postprocedural states: Secondary | ICD-10-CM

## 2020-05-20 NOTE — Progress Notes (Signed)
Virtual Visit via Telephone Note  I connected with Brittany Cortez on 05/20/20 at  4:15 PM EDT by telephone and verified that I am speaking with the correct person using two identifiers.  Pt presents for routine post op visit s/p Laparoscopic Bilateral Salpingectomy on 04/16/2020. No complaints per pt

## 2020-05-20 NOTE — Progress Notes (Signed)
    GYNECOLOGY VIRTUAL VISIT ENCOUNTER NOTE  Provider location: [facility] [place]   I connected with Brittany Cortez on 05/20/20 at  4:15 PM EDT by MyChart Video Encounter at home and verified that I am speaking with the correct person using two identifiers.   I discussed the limitations, risks, security and privacy concerns of performing an evaluation and management service virtually and the availability of in person appointments. I also discussed with the patient that there may be a patient responsible charge related to this service. The patient expressed understanding and agreed to proceed.   History:  Brittany Cortez is a 34 y.o. [phone] female being evaluated today for post op check following LSO salpingectomy for sterilization on 04/16/2020. She denies any abnormal vaginal discharge, bleeding, pelvic pain or other concerns.  Patient is without any complaints     Past Medical History:  Diagnosis Date  . ADHD (attention deficit hyperactivity disorder)   . Alcohol abuse   . Anxiety   . Asthma   . Depression   . Headache(784.0)   . Narcotic abuse (HCC)    history of   . Pregnant 10/31/2015  . Smoker 10/31/2015  . UTI (lower urinary tract infection) 12/06/2015   Past Surgical History:  Procedure Laterality Date  . CESAREAN SECTION  12/16/2011   Procedure: CESAREAN SECTION;  Surgeon: Lazaro Arms, MD;  Location: [facility];  Service: Gynecology;  Laterality: N/A;  . LAPAROSCOPIC BILATERAL SALPINGECTOMY Bilateral 04/16/2020   Procedure: LAPAROSCOPIC BILATERAL SALPINGECTOMY;  Surgeon: Catalina Antigua, MD;  Location: [facility];  Service: Gynecology;  Laterality: Bilateral;  . NO PAST SURGERIES    . OVARIAN CYST DRAINAGE  2013   The following portions of the patient's history were reviewed and updated as appropriate: allergies, current medications, past family history, past medical history, past social history, past surgical history and problem list.    Health Maintenance:  Normal pap and negative HRHPV on 02/2019.    Review of Systems:  Pertinent items noted in HPI and remainder of comprehensive ROS otherwise negative.  Physical Exam:   General:  Alert, oriented and cooperative. Patient appears to be in no acute distress.  Mental Status: Normal mood and affect. Normal behavior. Normal judgment and thought content.   Respiratory: Normal respiratory effort, no problems with respiration noted  Rest of physical exam deferred due to type of encounter  Labs and Imaging No results found for this or any previous visit (from the past 336 hour(s)). No results found.     Assessment and Plan:     1. Post-operative state Patient is doing well  She is medically cleared to resume all activities of daily living RTC prn       I discussed the assessment and treatment plan with the patient. The patient was provided an opportunity to ask questions and all were answered. The patient agreed with the plan and demonstrated an understanding of the instructions.   The patient was advised to call back or seek an in-person evaluation/go to the ED if the symptoms worsen or if the condition fails to improve as anticipated.  I provided 10 minutes of face-to-face time during this encounter.   Catalina Antigua, MD [facility] [employer], [facility]

## 2020-05-25 IMAGING — US US MFM OB FOLLOW-UP
1 series · 14 of 28 positions shown · non-contrast
Comparison: none

[Series 1: us mfm ob follow-up · 52 acquisitions, 14 frames shown]
[im 2/52]
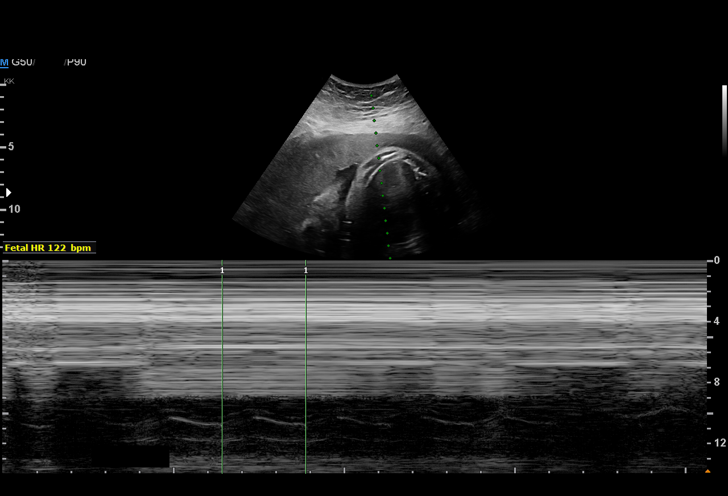
[im 6/52]
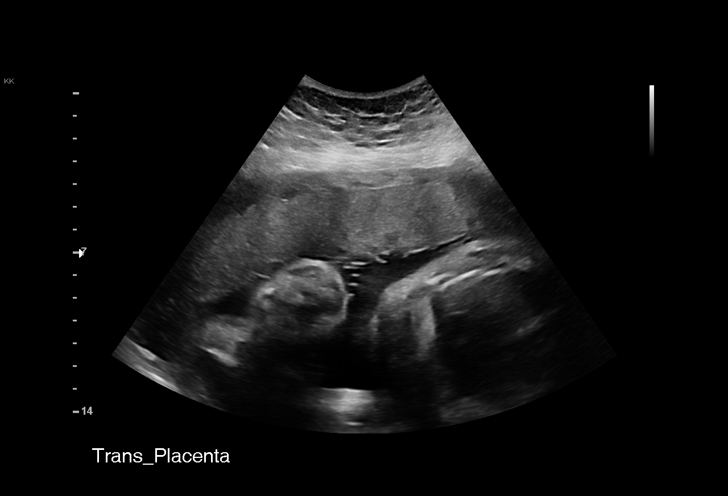
[im 10/52]
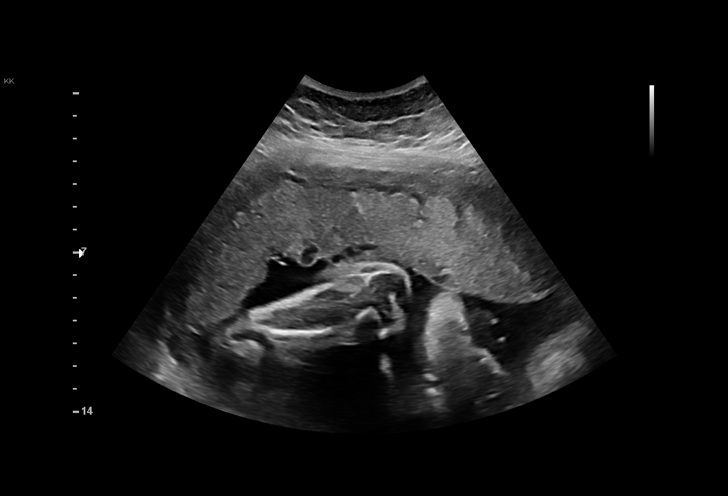
[im 14/52]
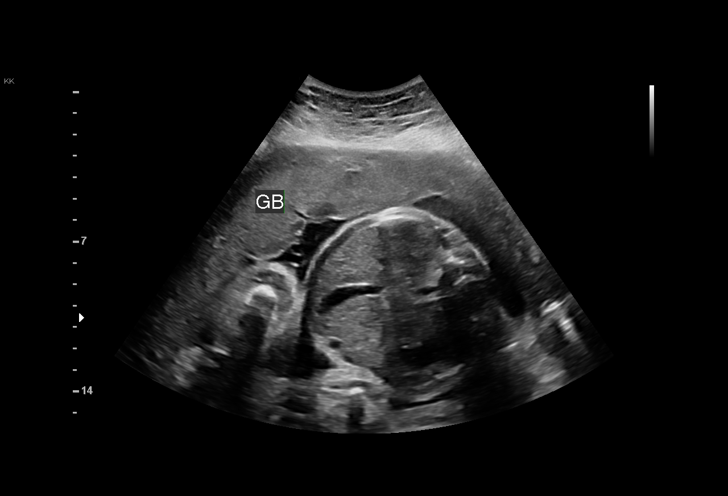
[im 18/52]
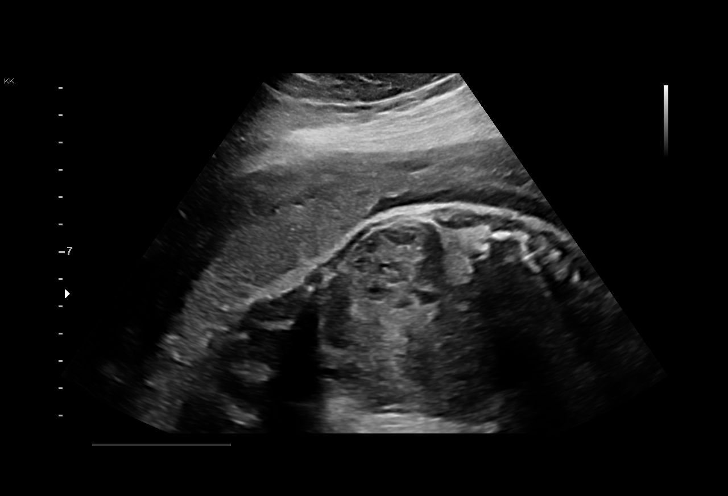
[im 21/52]
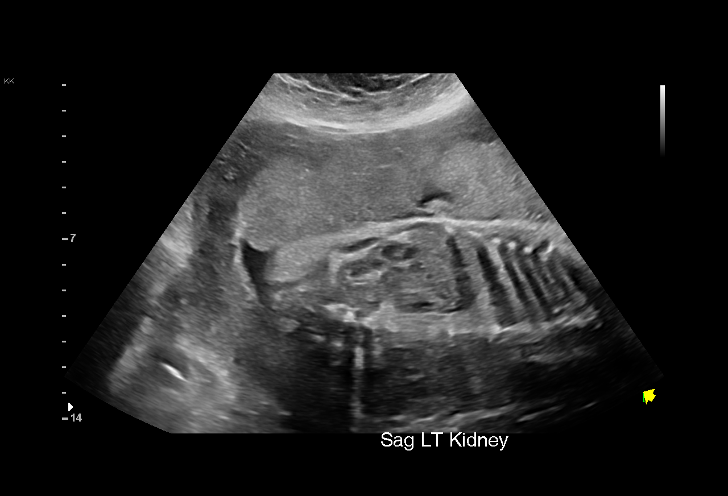
[im 25/52]
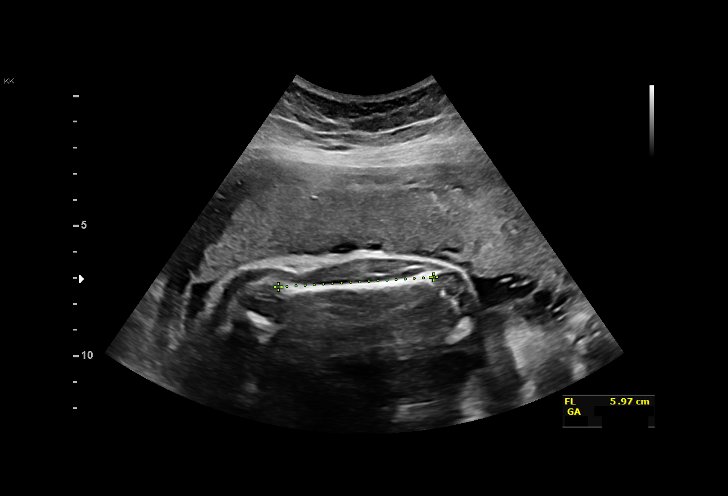
[im 29/52]
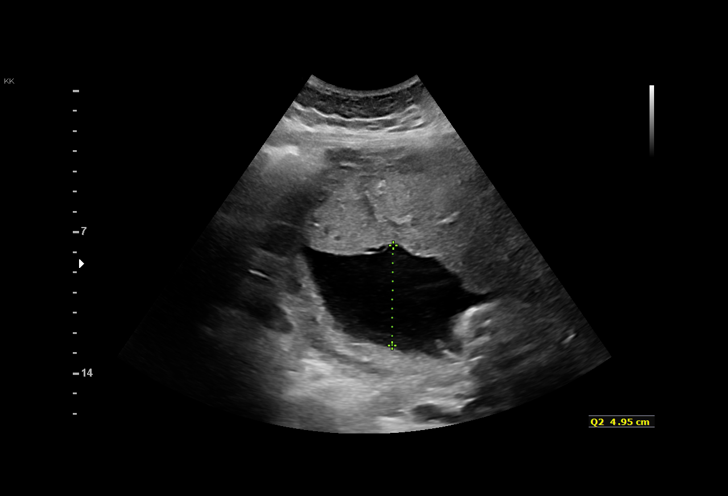
[im 33/52]
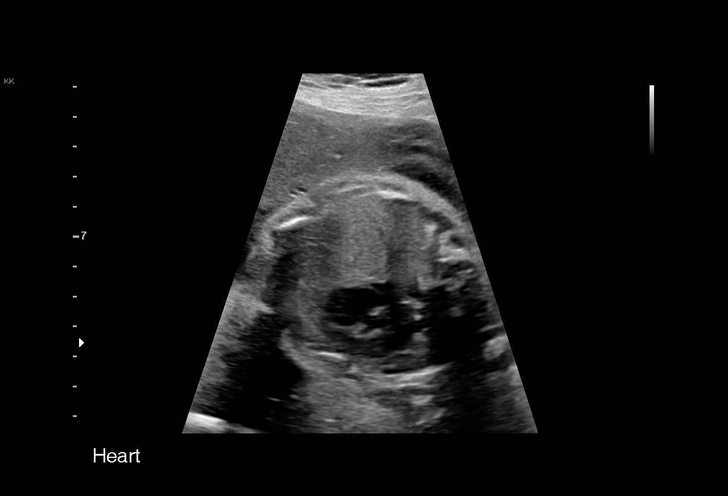
[im 36/52]
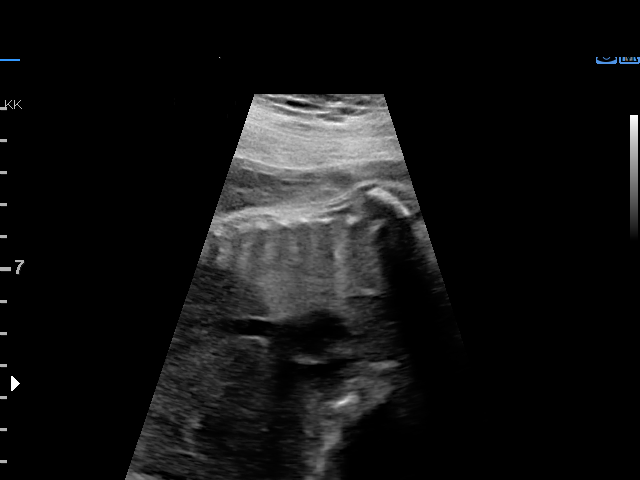
[im 40/52]
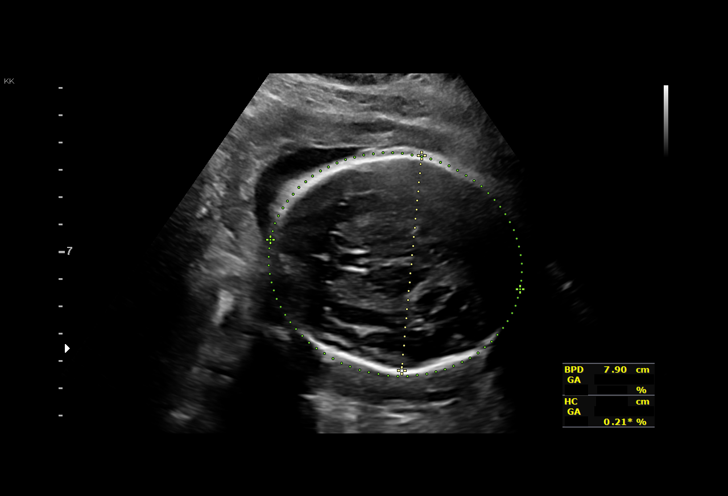
[im 44/52]
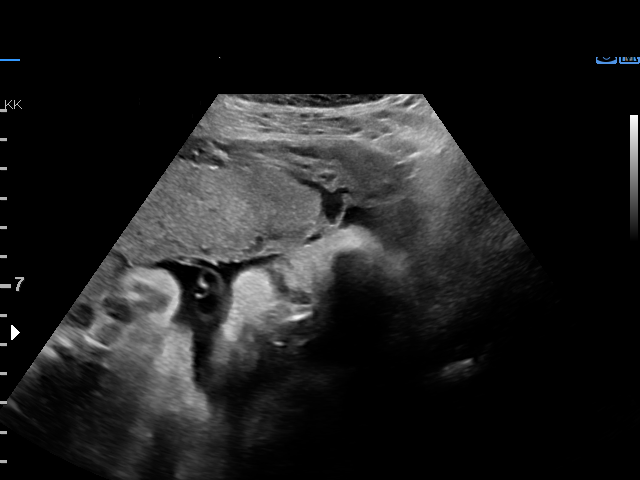
[im 48/52]
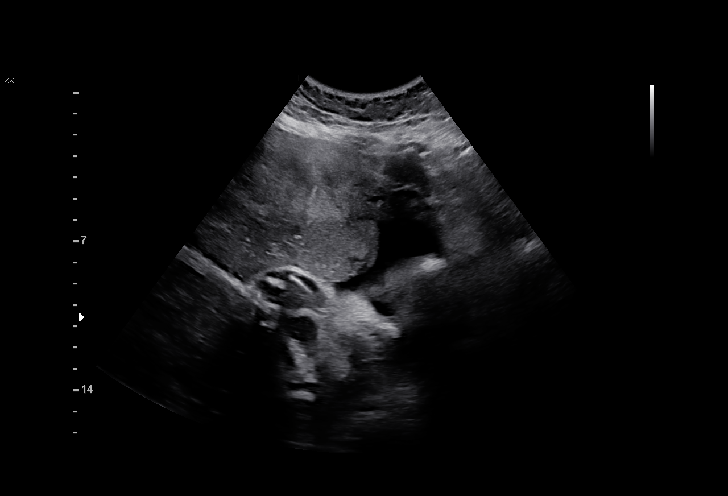
[im 52/52]
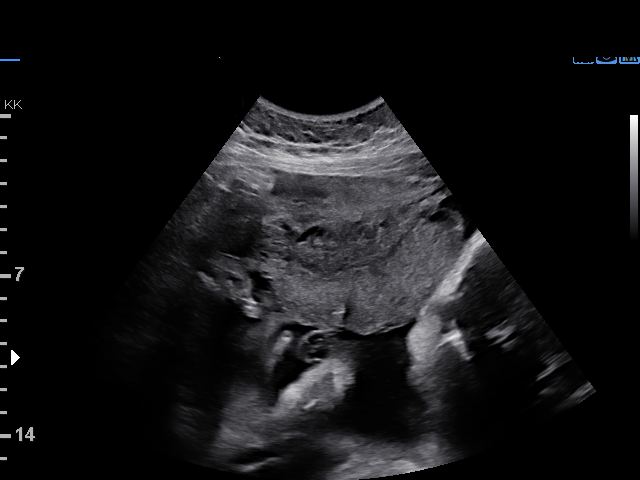

[14 of 28 positions shown; findings below may reference images not displayed]

REPOV
 ----------------------------------------------------------------------

 ----------------------------------------------------------------------
Indications

  32 weeks gestation of pregnancy
  Encounter for other antenatal screening
  follow-up
  Low Risk NIPS
  Pregnancy comlicated by subutex                O99.320, F11.20,
  maintenance, antepartum
  Previous cesarean delivery, antepartum
  Tobacco use complicating pregnancy, third
  trimester
  Asthma                                         7GG.MG j63.424
 ----------------------------------------------------------------------
Vital Signs

                                                Height:        5'7"
Fetal Evaluation

 Num Of Fetuses:         1
 Fetal Heart Rate(bpm):  122
 Cardiac Activity:       Observed
 Presentation:           Cephalic
 Placenta:               Anterior
 P. Cord Insertion:      Previously Visualized

 Amniotic Fluid
 AFI FV:      Within normal limits

 AFI Sum(cm)     %Tile       Largest Pocket(cm)
 14.26           49
 RUQ(cm)       RLQ(cm)       LUQ(cm)        LLQ(cm)

Biophysical Evaluation

 Amniotic F.V:   Pocket => 2 cm             F. Tone:        Observed
 F. Movement:    Observed                   Score:          [DATE]
 F. Breathing:   Observed
Biometry

 BPD:      79.2  mm     G. Age:  31w 6d         27  %    CI:        79.22   %    70 - 86
                                                         FL/HC:      21.5   %    19.1 -
 HC:      281.3  mm     G. Age:  30w 6d          1  %    HC/AC:      0.97        0.96 -
 AC:      289.4  mm     G. Age:  33w 0d         69  %    FL/BPD:     76.5   %    71 - 87
 FL:       60.6  mm     G. Age:  31w 4d         18  %    FL/AC:      20.9   %    20 - 24

 Est. FW:    4194  gm      4 lb 4 oz     36  %
OB History

 Gravidity:    3         Term:   2        Prem:   0        SAB:   0
 TOP:          0       Ectopic:  0        Living: 2
Gestational Age

 U/S Today:     31w 6d                                        EDD:   09/02/19
 Best:          32w 2d     Det. By:  U/S  (03/24/19)          EDD:   08/30/19
Anatomy

 Cranium:               Appears normal         LVOT:                   Previously seen
 Cavum:                 Previously seen        Aortic Arch:            Previously seen
 Ventricles:            Appears normal         Ductal Arch:            Previously seen
 Choroid Plexus:        Previously seen        Diaphragm:              Previously seen
 Cerebellum:            Previously seen        Stomach:                Appears normal, left
                                                                       sided
 Posterior Fossa:       Previously seen        Abdomen:                Appears normal
 Nuchal Fold:           Previously seen        Abdominal Wall:         Previously seen
 Face:                  Orbits and profile     Cord Vessels:           Previously seen
                        previously seen
 Lips:                  Previously seen        Kidneys:                Appear normal
 Palate:                Previously seen        Bladder:                Appears normal
 Thoracic:              Appears normal         Spine:                  Previously seen
 Heart:                 Appears normal         Upper Extremities:      Previously seen
                        (4CH, axis, and
                        situs)
 RVOT:                  Appears normal         Lower Extremities:      Previously seen

 Other:  Nasal bone, Heels and 5th digits previously visualized. Technically
         difficult due to fetal position.
Cervix Uterus Adnexa

 Cervix
 Not visualized (advanced GA >03wks)
Impression

 Amniotic fluid is normal and good fetal activity is seen. Fetal
 growth is appropriate for gestational age. Incidentally
 observed antenatal testing is reassuring ([facility] [DATE]).
                 Sahagun, Briar

## 2020-06-22 IMAGING — US US MFM OB FOLLOW-UP
1 series · 13 of 28 positions shown · non-contrast
Comparison: none

[Series 1: us mfm ob follow-up · 43 acquisitions, 13 frames shown]
[im 2/43]
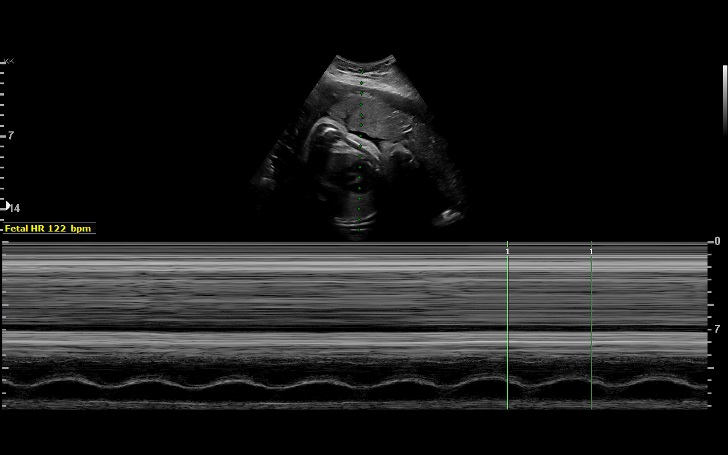
[im 5/43]
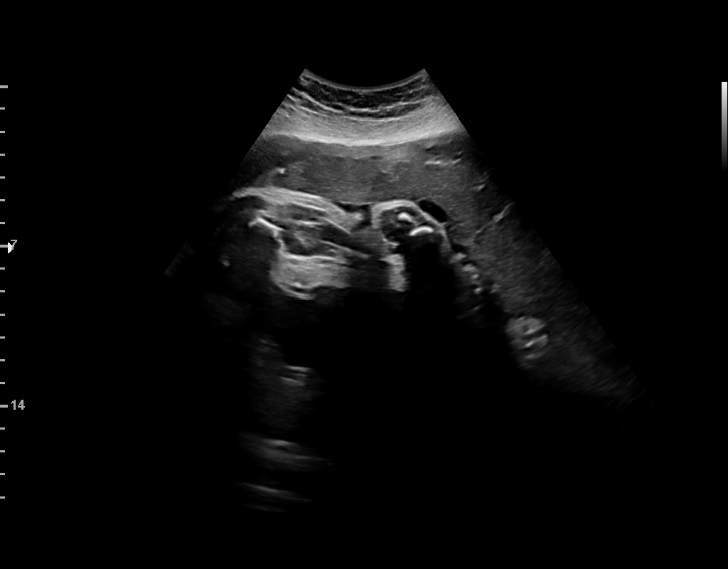
[im 8/43]
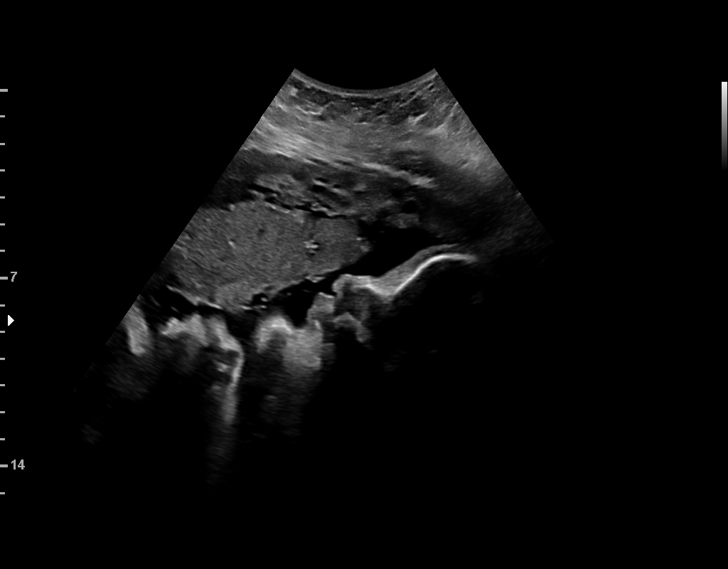
[im 11/43]
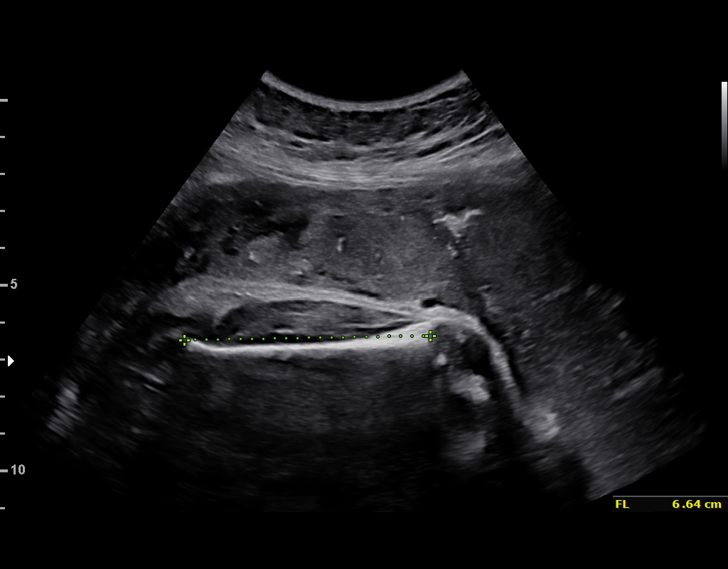
[im 15/43]
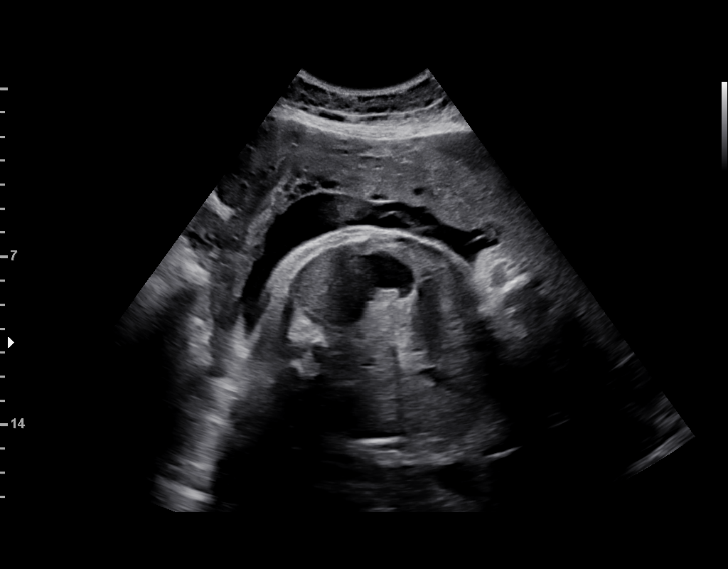
[im 18/43]
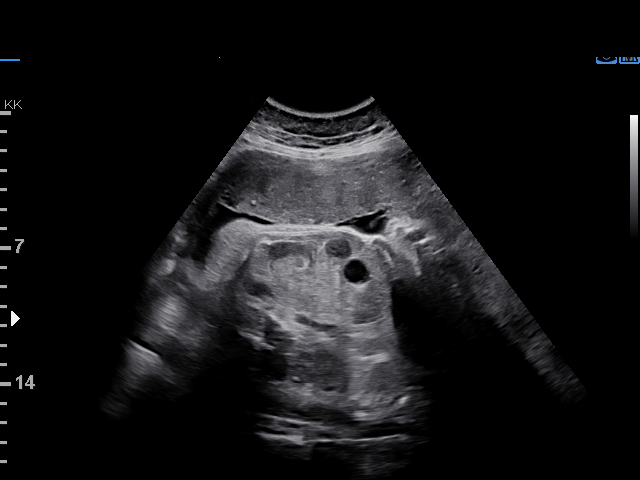
[im 22/43]
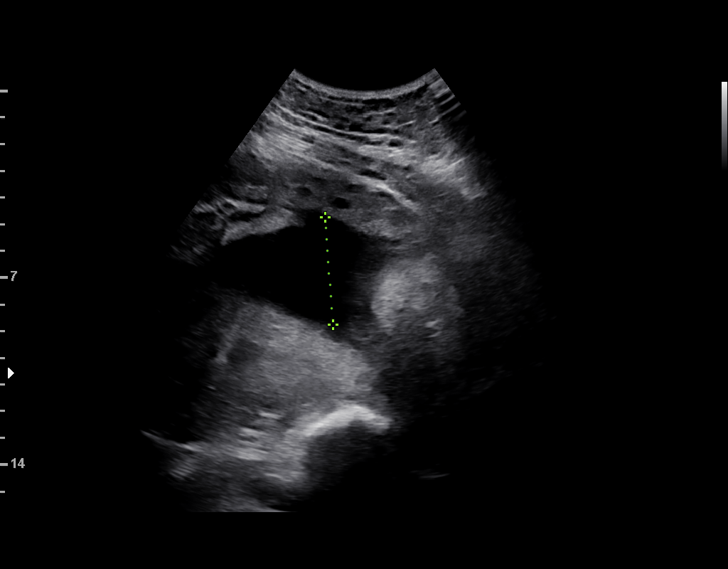
[im 25/43]
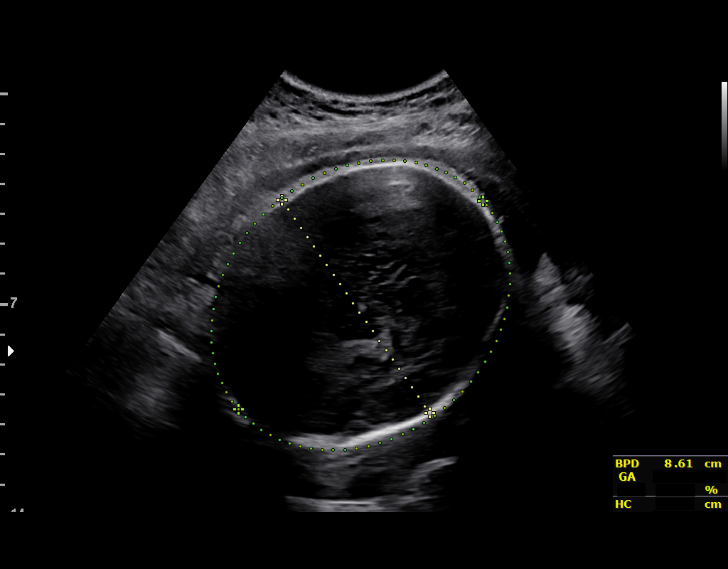
[im 29/43]
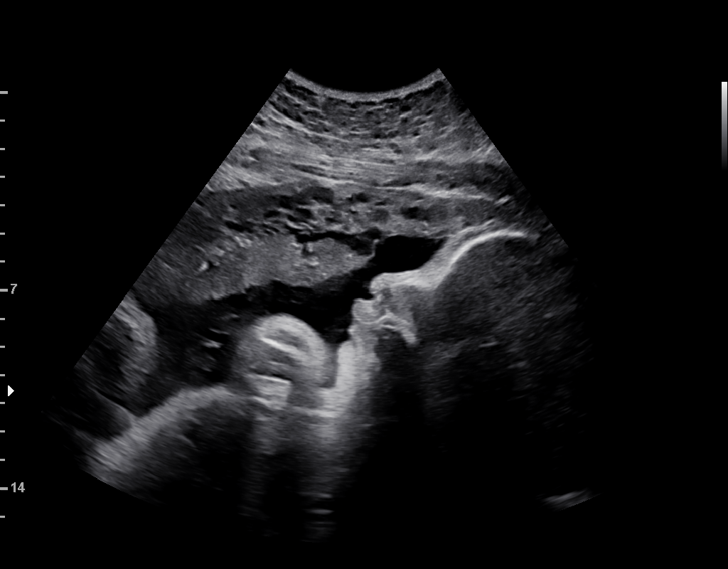
[im 32/43]
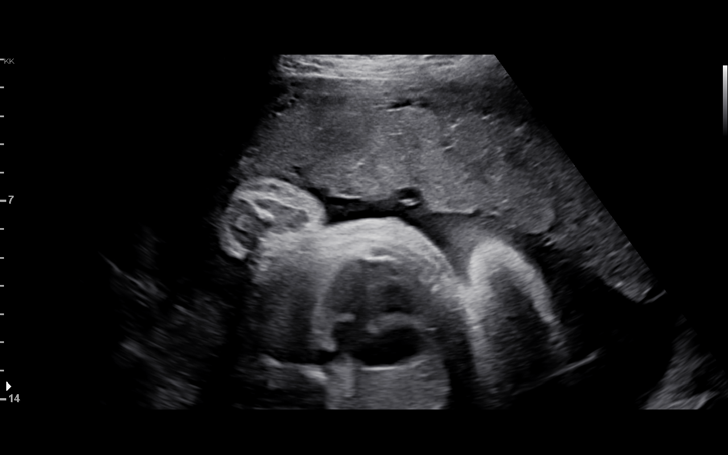
[im 35/43]
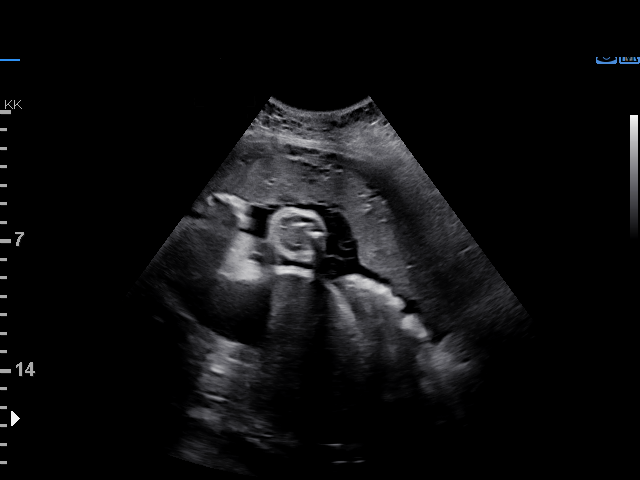
[im 38/43]
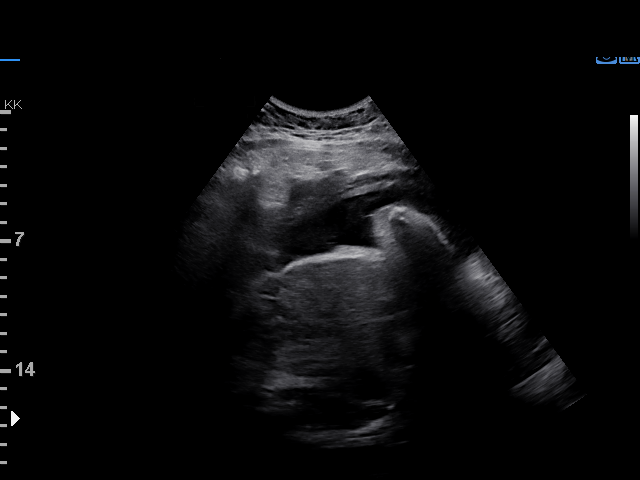
[im 41/43]
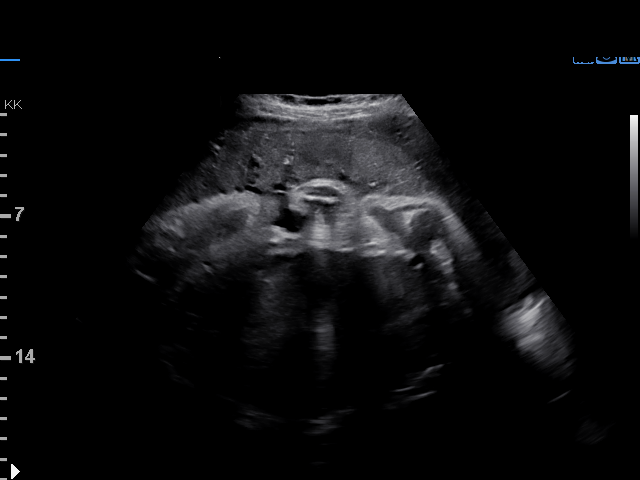

[13 of 28 positions shown; findings below may reference images not displayed]

----------------------------------------------------------------------

 ----------------------------------------------------------------------
Indications

  36 weeks gestation of pregnancy
  Encounter for other antenatal screening
  follow-up
  Low Risk NIPS
  Pregnancy comlicated by subutex                O99.320, F11.20,
  maintenance, antepartum
  Previous cesarean delivery, antepartum
  Tobacco use complicating pregnancy, third
  trimester
  Asthma                                         IEE.GE j04.606
  Alcohol use complicating pregnancy, third
  trimester
 ----------------------------------------------------------------------
Vital Signs

                                                Height:        5'7"
Fetal Evaluation

 Num Of Fetuses:         1
 Fetal Heart Rate(bpm):  122
 Cardiac Activity:       Observed
 Presentation:           Cephalic
 Placenta:               Anterior
 P. Cord Insertion:      Previously Visualized

 Amniotic Fluid
 AFI FV:      Within normal limits

 AFI Sum(cm)     %Tile       Largest Pocket(cm)
 13.65           50
 RUQ(cm)       RLQ(cm)       LUQ(cm)        LLQ(cm)
 4.2           2
Biometry

 BPD:      86.1  mm     G. Age:  34w 5d         18  %    CI:        75.72   %    70 - 86
                                                         FL/HC:      21.3   %    20.1 -
 HC:      313.7  mm     G. Age:  35w 1d          6  %    HC/AC:      1.00        0.93 -
 AC:       314   mm     G. Age:  35w 2d         33  %    FL/BPD:     77.7   %    71 - 87
 FL:       66.9  mm     G. Age:  34w 3d          8  %    FL/AC:      21.3   %    20 - 24

 Est. FW:    3385  gm    5 lb 11 oz      20  %
OB History

 Gravidity:    3         Term:   2        Prem:   0        SAB:   0
 TOP:          0       Ectopic:  0        Living: 2
Gestational Age

 U/S Today:     34w 6d                                        EDD:   09/09/19
 Best:          36w 2d     Det. By:  U/S  (03/24/19)          EDD:   08/30/19
Anatomy

 Cranium:               Appears normal         LVOT:                   Previously seen
 Cavum:                 Previously seen        Aortic Arch:            Previously seen
 Ventricles:            Previously seen        Ductal Arch:            Previously seen
 Choroid Plexus:        Previously seen        Diaphragm:              Previously seen
 Cerebellum:            Previously seen        Stomach:                Appears normal, left
                                                                       sided
 Posterior Fossa:       Previously seen        Abdomen:                Appears normal
 Nuchal Fold:           Previously seen        Abdominal Wall:         Previously seen
 Face:                  Orbits and profile     Cord Vessels:           Previously seen
                        previously seen
 Lips:                  Previously seen        Kidneys:                Appear normal
 Palate:                Previously seen        Bladder:                Appears normal
 Thoracic:              Appears normal         Spine:                  Previously seen
 Heart:                 Appears normal         Upper Extremities:      Previously seen
                        (4CH, axis, and
                        situs)
 RVOT:                  Previously seen        Lower Extremities:      Previously seen

 Other:  Nasal bone, Heels and 5th digits previously visualized. Technically
         difficult due to fetal position.
Cervix Uterus Adnexa

 Cervix
 Not visualized (advanced GA >14wks)
Impression

 Subutex maintenance.
 Fetal growth is appropriate for gestational age. Amniotic fluid
 is normal and good fetal activity is seen.
 We reassured the patient of the findings.
Recommendations

 Follow-up scans as clinically indicated.
                 Staals, Meeuwis

## 2020-09-10 ENCOUNTER — Encounter: Payer: Self-pay | Admitting: General Practice
